# Patient Record
Sex: Female | Born: 1935
Health system: Southern US, Community
[De-identification: ages and names within clinical notes are randomized; demographics above are authoritative.]

## PROBLEM LIST (undated history)

## (undated) DIAGNOSIS — B029 Zoster without complications: Secondary | ICD-10-CM

## (undated) DIAGNOSIS — I729 Aneurysm of unspecified site: Secondary | ICD-10-CM

## (undated) DIAGNOSIS — M1990 Unspecified osteoarthritis, unspecified site: Secondary | ICD-10-CM

## (undated) DIAGNOSIS — O9989 Other specified diseases and conditions complicating pregnancy, childbirth and the puerperium: Secondary | ICD-10-CM

## (undated) DIAGNOSIS — I639 Cerebral infarction, unspecified: Secondary | ICD-10-CM

## (undated) DIAGNOSIS — R51 Headache: Secondary | ICD-10-CM

## (undated) DIAGNOSIS — I10 Essential (primary) hypertension: Secondary | ICD-10-CM

## (undated) DIAGNOSIS — E039 Hypothyroidism, unspecified: Secondary | ICD-10-CM

## (undated) DIAGNOSIS — G709 Myoneural disorder, unspecified: Secondary | ICD-10-CM

## (undated) DIAGNOSIS — R519 Headache, unspecified: Secondary | ICD-10-CM

## (undated) DIAGNOSIS — I499 Cardiac arrhythmia, unspecified: Secondary | ICD-10-CM

## (undated) DIAGNOSIS — I4891 Unspecified atrial fibrillation: Secondary | ICD-10-CM

## (undated) DIAGNOSIS — O99891 Other specified diseases and conditions complicating pregnancy: Secondary | ICD-10-CM

## (undated) HISTORY — PX: CARPAL TUNNEL RELEASE: SHX101

## (undated) HISTORY — DX: Zoster without complications: B02.9

## (undated) HISTORY — PX: ABDOMINAL HYSTERECTOMY: SHX81

## (undated) HISTORY — PX: CATARACT EXTRACTION: SUR2

## (undated) HISTORY — PX: APPENDECTOMY: SHX54

## (undated) HISTORY — PX: CHOLECYSTECTOMY: SHX55

## (undated) HISTORY — PX: JOINT REPLACEMENT: SHX530

## (undated) HISTORY — DX: Cerebral infarction, unspecified: I63.9

---

## 2004-10-16 ENCOUNTER — Ambulatory Visit: Payer: Self-pay | Admitting: Internal Medicine

## 2004-10-20 ENCOUNTER — Ambulatory Visit: Payer: Self-pay | Admitting: Internal Medicine

## 2004-10-23 ENCOUNTER — Inpatient Hospital Stay (HOSPITAL_BASED_OUTPATIENT_CLINIC_OR_DEPARTMENT_OTHER): Admission: RE | Admit: 2004-10-23 | Discharge: 2004-10-23 | Payer: Self-pay | Admitting: Internal Medicine

## 2004-10-23 ENCOUNTER — Ambulatory Visit: Payer: Self-pay | Admitting: Internal Medicine

## 2004-10-30 ENCOUNTER — Ambulatory Visit: Payer: Self-pay | Admitting: Internal Medicine

## 2004-11-03 ENCOUNTER — Ambulatory Visit: Payer: Self-pay

## 2015-09-10 DIAGNOSIS — I639 Cerebral infarction, unspecified: Secondary | ICD-10-CM | POA: Diagnosis not present

## 2015-09-10 DIAGNOSIS — E7800 Pure hypercholesterolemia, unspecified: Secondary | ICD-10-CM | POA: Diagnosis not present

## 2015-09-10 DIAGNOSIS — R262 Difficulty in walking, not elsewhere classified: Secondary | ICD-10-CM | POA: Diagnosis not present

## 2015-09-10 DIAGNOSIS — Z7982 Long term (current) use of aspirin: Secondary | ICD-10-CM | POA: Diagnosis not present

## 2015-09-10 DIAGNOSIS — R200 Anesthesia of skin: Secondary | ICD-10-CM | POA: Diagnosis not present

## 2015-09-10 DIAGNOSIS — Z91010 Allergy to peanuts: Secondary | ICD-10-CM | POA: Diagnosis not present

## 2015-09-10 DIAGNOSIS — Z136 Encounter for screening for cardiovascular disorders: Secondary | ICD-10-CM | POA: Diagnosis not present

## 2015-09-10 DIAGNOSIS — Z91018 Allergy to other foods: Secondary | ICD-10-CM | POA: Diagnosis not present

## 2015-09-10 DIAGNOSIS — I10 Essential (primary) hypertension: Secondary | ICD-10-CM | POA: Diagnosis not present

## 2015-09-10 DIAGNOSIS — R42 Dizziness and giddiness: Secondary | ICD-10-CM | POA: Diagnosis not present

## 2015-09-25 DIAGNOSIS — I10 Essential (primary) hypertension: Secondary | ICD-10-CM | POA: Diagnosis not present

## 2015-09-25 DIAGNOSIS — R9431 Abnormal electrocardiogram [ECG] [EKG]: Secondary | ICD-10-CM | POA: Diagnosis not present

## 2015-09-25 DIAGNOSIS — E7800 Pure hypercholesterolemia, unspecified: Secondary | ICD-10-CM | POA: Diagnosis not present

## 2015-09-25 DIAGNOSIS — E669 Obesity, unspecified: Secondary | ICD-10-CM | POA: Diagnosis not present

## 2015-11-07 DIAGNOSIS — R55 Syncope and collapse: Secondary | ICD-10-CM | POA: Diagnosis not present

## 2015-11-14 DIAGNOSIS — I480 Paroxysmal atrial fibrillation: Secondary | ICD-10-CM | POA: Diagnosis not present

## 2015-11-14 DIAGNOSIS — R55 Syncope and collapse: Secondary | ICD-10-CM | POA: Diagnosis not present

## 2015-11-14 DIAGNOSIS — I10 Essential (primary) hypertension: Secondary | ICD-10-CM | POA: Diagnosis not present

## 2015-11-15 DIAGNOSIS — I4891 Unspecified atrial fibrillation: Secondary | ICD-10-CM | POA: Diagnosis not present

## 2015-11-23 DIAGNOSIS — I639 Cerebral infarction, unspecified: Secondary | ICD-10-CM

## 2015-11-23 HISTORY — DX: Cerebral infarction, unspecified: I63.9

## 2015-12-16 ENCOUNTER — Emergency Department (HOSPITAL_COMMUNITY): Payer: Medicare Other

## 2015-12-16 ENCOUNTER — Inpatient Hospital Stay (HOSPITAL_COMMUNITY)
Admission: EM | Admit: 2015-12-16 | Discharge: 2015-12-18 | DRG: 066 | Disposition: A | Discharge status: Home / Self Care | Payer: Medicare Other | Attending: Internal Medicine | Admitting: Internal Medicine

## 2015-12-16 ENCOUNTER — Other Ambulatory Visit: Payer: Self-pay

## 2015-12-16 ENCOUNTER — Encounter (HOSPITAL_COMMUNITY): Payer: Self-pay | Admitting: Emergency Medicine

## 2015-12-16 DIAGNOSIS — R4781 Slurred speech: Secondary | ICD-10-CM | POA: Diagnosis present

## 2015-12-16 DIAGNOSIS — G43909 Migraine, unspecified, not intractable, without status migrainosus: Secondary | ICD-10-CM | POA: Diagnosis present

## 2015-12-16 DIAGNOSIS — Z6836 Body mass index (BMI) 36.0-36.9, adult: Secondary | ICD-10-CM

## 2015-12-16 DIAGNOSIS — R42 Dizziness and giddiness: Secondary | ICD-10-CM | POA: Diagnosis not present

## 2015-12-16 DIAGNOSIS — E669 Obesity, unspecified: Secondary | ICD-10-CM | POA: Diagnosis present

## 2015-12-16 DIAGNOSIS — E785 Hyperlipidemia, unspecified: Secondary | ICD-10-CM | POA: Diagnosis not present

## 2015-12-16 DIAGNOSIS — I6340 Cerebral infarction due to embolism of unspecified cerebral artery: Principal | ICD-10-CM | POA: Diagnosis present

## 2015-12-16 DIAGNOSIS — I639 Cerebral infarction, unspecified: Secondary | ICD-10-CM

## 2015-12-16 DIAGNOSIS — G459 Transient cerebral ischemic attack, unspecified: Secondary | ICD-10-CM

## 2015-12-16 DIAGNOSIS — E7800 Pure hypercholesterolemia, unspecified: Secondary | ICD-10-CM | POA: Diagnosis present

## 2015-12-16 DIAGNOSIS — I10 Essential (primary) hypertension: Secondary | ICD-10-CM | POA: Diagnosis not present

## 2015-12-16 DIAGNOSIS — R29700 NIHSS score 0: Secondary | ICD-10-CM | POA: Diagnosis present

## 2015-12-16 DIAGNOSIS — R479 Unspecified speech disturbances: Secondary | ICD-10-CM | POA: Diagnosis present

## 2015-12-16 DIAGNOSIS — I480 Paroxysmal atrial fibrillation: Secondary | ICD-10-CM | POA: Diagnosis present

## 2015-12-16 DIAGNOSIS — Z7982 Long term (current) use of aspirin: Secondary | ICD-10-CM

## 2015-12-16 DIAGNOSIS — I671 Cerebral aneurysm, nonruptured: Secondary | ICD-10-CM | POA: Diagnosis present

## 2015-12-16 DIAGNOSIS — I4891 Unspecified atrial fibrillation: Secondary | ICD-10-CM | POA: Diagnosis present

## 2015-12-16 DIAGNOSIS — Z79899 Other long term (current) drug therapy: Secondary | ICD-10-CM

## 2015-12-16 DIAGNOSIS — I499 Cardiac arrhythmia, unspecified: Secondary | ICD-10-CM | POA: Diagnosis not present

## 2015-12-16 DIAGNOSIS — R51 Headache: Secondary | ICD-10-CM | POA: Diagnosis not present

## 2015-12-16 DIAGNOSIS — R531 Weakness: Secondary | ICD-10-CM

## 2015-12-16 HISTORY — DX: Essential (primary) hypertension: I10

## 2015-12-16 HISTORY — DX: Unspecified atrial fibrillation: I48.91

## 2015-12-16 LAB — COMPREHENSIVE METABOLIC PANEL
ALK PHOS: 58 U/L (ref 38–126)
ALT: 18 U/L (ref 14–54)
ANION GAP: 12 (ref 5–15)
AST: 19 U/L (ref 15–41)
Albumin: 4.2 g/dL (ref 3.5–5.0)
BILIRUBIN TOTAL: 0.8 mg/dL (ref 0.3–1.2)
BUN: 8 mg/dL (ref 6–20)
CALCIUM: 9.4 mg/dL (ref 8.9–10.3)
CO2: 26 mmol/L (ref 22–32)
Chloride: 101 mmol/L (ref 101–111)
Creatinine, Ser: 0.8 mg/dL (ref 0.44–1.00)
Glucose, Bld: 91 mg/dL (ref 65–99)
POTASSIUM: 3.6 mmol/L (ref 3.5–5.1)
Sodium: 139 mmol/L (ref 135–145)
TOTAL PROTEIN: 7.5 g/dL (ref 6.5–8.1)

## 2015-12-16 LAB — DIFFERENTIAL
Basophils Absolute: 0 10*3/uL (ref 0.0–0.1)
Basophils Relative: 0 %
EOS ABS: 0.2 10*3/uL (ref 0.0–0.7)
EOS PCT: 3 %
LYMPHS ABS: 2.7 10*3/uL (ref 0.7–4.0)
LYMPHS PCT: 40 %
MONO ABS: 0.5 10*3/uL (ref 0.1–1.0)
MONOS PCT: 7 %
NEUTROS PCT: 50 %
Neutro Abs: 3.4 10*3/uL (ref 1.7–7.7)

## 2015-12-16 LAB — CBC
HEMATOCRIT: 40.3 % (ref 36.0–46.0)
Hemoglobin: 13.5 g/dL (ref 12.0–15.0)
MCH: 28.3 pg (ref 26.0–34.0)
MCHC: 33.5 g/dL (ref 30.0–36.0)
MCV: 84.5 fL (ref 78.0–100.0)
Platelets: 235 10*3/uL (ref 150–400)
RBC: 4.77 MIL/uL (ref 3.87–5.11)
RDW: 13.5 % (ref 11.5–15.5)
WBC: 6.8 10*3/uL (ref 4.0–10.5)

## 2015-12-16 LAB — I-STAT CHEM 8, ED
BUN: 9 mg/dL (ref 6–20)
CALCIUM ION: 1.15 mmol/L (ref 1.13–1.30)
Chloride: 100 mmol/L — ABNORMAL LOW (ref 101–111)
Creatinine, Ser: 0.7 mg/dL (ref 0.44–1.00)
Glucose, Bld: 87 mg/dL (ref 65–99)
HEMATOCRIT: 45 % (ref 36.0–46.0)
Hemoglobin: 15.3 g/dL — ABNORMAL HIGH (ref 12.0–15.0)
Potassium: 3.5 mmol/L (ref 3.5–5.1)
Sodium: 139 mmol/L (ref 135–145)
TCO2: 24 mmol/L (ref 0–100)

## 2015-12-16 LAB — I-STAT TROPONIN, ED: TROPONIN I, POC: 0 ng/mL (ref 0.00–0.08)

## 2015-12-16 NOTE — ED Notes (Signed)
Pt with ongoing headache and dizziness x 2 days following yard work. Pt was seen at [facility] today and sent here. Pt daughter states pt had some confusion today.

## 2015-12-16 NOTE — ED Notes (Signed)
Neurologist at bedside. 

## 2015-12-16 NOTE — ED Provider Notes (Signed)
CSN: [id number]     Arrival date & time 12/16/15  1717 History   First MD Initiated Contact with Patient 12/16/15 2218     Chief Complaint  Patient presents with  . Headache  . Dizziness     (Consider location/radiation/quality/duration/timing/severity/associated sxs/prior Treatment) HPI This is an 82 year old patient with history of paroxysmal atrial fib, not on anticoagulation due to history of GI bleed while on [place], who presents with 3 days of symptoms. She says that she was working in the yard on Saturday, did not drink much water or eat much, and started feeling ill in the evening. She had headache, vertigo, and generalized malaise. The headache persisted and became very severe over the course of the day Sunday, and was associated with vertigo that she said felt worse with head movement. She did not eat much all day.  This morning she woke up still with a headache and vertigo, and family reports that she had some difficulty finding words. She couldn't remember the name for bread.  She said her symptoms got better after she ate something, and she took meclizine and her vertigo abated. She presents after being referred here from urgent care.  She currently has no symptoms.    Past Medical History  Diagnosis Date  . Atrial fibrillation ([place])   . Hypertension    No past surgical history on file. No family history on file. Social History  Substance Use Topics  . Smoking status: Never Smoker   . Smokeless tobacco: None  . Alcohol Use: No   OB History    No data available     Review of Systems  Constitutional: Positive for diaphoresis. Negative for chills and fatigue.  Eyes: Negative for photophobia and visual disturbance.  Gastrointestinal: Negative for abdominal pain.  Musculoskeletal: Negative for myalgias, back pain, joint swelling, arthralgias and gait problem.  Neurological: Positive for dizziness, light-headedness and headaches. Negative for facial asymmetry.  All  other systems reviewed and are negative.     Allergies  Shellfish allergy  Home Medications   Prior to Admission medications   Medication Sig Start Date End Date Taking? Authorizing Provider  aspirin 81 MG tablet Take 81 mg by mouth daily.   Yes Historical Provider, MD  hydrochlorothiazide (HYDRODIURIL) 25 MG tablet Take 25 mg by mouth daily.   Yes Historical Provider, MD  meclizine (ANTIVERT) 25 MG tablet Take 12.5 mg by mouth 3 (three) times daily as needed for dizziness.   Yes Historical Provider, MD  rosuvastatin (CRESTOR) 5 MG tablet Take 2.5 mg by mouth daily.   Yes Historical Provider, MD   BP 140/55 mmHg  Pulse 40  Temp(Src) 97.8 F (36.6 C) (Oral)  Resp 17  Ht 5\' 2"  (1.575 m)  Wt 88.905 kg  BMI 35.84 kg/m2  SpO2 96% Physical Exam  Constitutional: She is oriented to person, place, and time. She appears well-developed and well-nourished. No distress.  HENT:  Head: Normocephalic and atraumatic.  Eyes: Pupils are equal, round, and reactive to light.  Neck: Normal range of motion. Neck supple.  Cardiovascular: Normal rate, regular rhythm and intact distal pulses.   Murmur heard. Pulmonary/Chest: Effort normal and breath sounds normal. No respiratory distress. She has no wheezes.  Abdominal: Soft. Bowel sounds are normal. She exhibits no distension.  Musculoskeletal: Normal range of motion. She exhibits no edema.  Neurological: She is alert and oriented to person, place, and time. No cranial nerve deficit.  Strength symmetric and 4 out of 5 in all extremities  Normal sensation  Normal lower extremity reflexes  No dysmetria, normal gait  Skin: Skin is warm and dry.  Psychiatric: She has a normal mood and affect.  Nursing note and vitals reviewed.   ED Course  Procedures (including critical care time) Labs Review Labs Reviewed  I-STAT CHEM 8, ED - Abnormal; Notable for the following:    Chloride 100 (*)    Hemoglobin 15.3 (*)    All other components within  normal limits  CBC  DIFFERENTIAL  COMPREHENSIVE METABOLIC PANEL  I-STAT TROPOININ, ED  CBG MONITORING, ED    Imaging Review Ct Head Wo Contrast  12/16/2015  CLINICAL DATA:  82 year old with dizziness and confusion. Dehydration. EXAM: CT HEAD WITHOUT CONTRAST TECHNIQUE: Contiguous axial images were obtained from the base of the skull through the vertex without intravenous contrast. COMPARISON:  None. FINDINGS: Brain: There is no evidence of acute intracranial hemorrhage, mass lesion, brain edema or extra-axial fluid collection. The ventricles and subarachnoid spaces are appropriately sized for age. There is no CT evidence of acute cortical infarction. There is mild periventricular white matter disease, likely due to chronic small vessel ischemic changes. Intracranial vascular calcifications are noted. Bones/sinuses/visualized face: The visualized paranasal sinuses, mastoid air cells and middle ears are clear. The calvarium is intact. IMPRESSION: No acute intracranial findings. Age-appropriate atrophy with mild periventricular white matter disease, likely due to chronic small vessel ischemic changes. Electronically Signed   By: Richardean Sale M.D.   On: 12/16/2015 20:22   I have personally reviewed and evaluated these images and lab results as part of my medical decision-making.   EKG Interpretation   Date/Time:  Monday December 16 2015 17:45:18 EDT Ventricular Rate:  56 PR Interval:  176 QRS Duration: 76 QT Interval:  434 QTC Calculation: 418 R Axis:   -27 Text Interpretation:  Sinus bradycardia with Premature atrial complexes  Nonspecific ST and T wave abnormality Abnormal ECG No previous ECGs  available Confirmed by YAO  MD, DAVID ([id number]) on 12/16/2015 10:58:08 PM      MDM   Final diagnoses:  Transient cerebral ischemia, unspecified transient cerebral ischemia type   Patient presents with dizziness, malaise, vertigo and word finding difficulties. Patient has normal head CT, and  symptoms began 3 days ago, doubt subarachnoid hemorrhage, as he would likely have some findings by this point. She does have a history of atrial fib, and is not anticoagulated which raises the suspicion for stroke, though she has no cerebellar findings on exam now, so unlikely an ongoing process. I am concerned for TIA, versus dehydration and hypoglycemia which may have been causing her symptoms, since they resolved after food and oral fluids. Will obtain consultation with neurology for recommendations regarding TIA.   Neurologist recommends admission, will admit to hospitalist for TIA workup.  Leata Mouse, MD 12/17/15 [id number]  Wandra Arthurs, MD 12/19/15 [phone]

## 2015-12-16 NOTE — Consult Note (Signed)
Admission H&P    Chief Complaint: Transient difficulty with speech output.  HPI: Sara Frye is an 82 y.o. female with a history of atrial fibrillation, hypertension and hyperlipidemia presenting with a complaint of difficulty with speech output earlier today. Symptom onset was at 9 AM. Symptoms lasted for several hours. She also complained of dizziness for the past 2 days with vertigo. She had no focal deficits until earlier today. CT scan of her head showed no acute intracranial abnormality. She's been taking aspirin 81 mg per day. She was recently given a trial of Xarelto. This medication was discontinued because of rectal bleeding. Speech abnormalities resolved after arriving in the emergency room. NIH stroke score at the time of this evaluation was 0. She had no associated focal weakness nor facial asymmetry during the time she had speech difficulty. Speech was slightly slurred transiently, however.  LSN: 9 AM on 12/16/2015 tPA Given: No: Deficits resolved mRankin:  Past Medical History  Diagnosis Date  . Atrial fibrillation ([place])   . Hypertension     No past surgical history on file.  No family history on file. Social History:  reports that she has never smoked. She does not have any smokeless tobacco history on file. She reports that she does not drink alcohol or use illicit drugs.  Allergies:  Allergies  Allergen Reactions  . Shellfish Allergy     Rash    Medications: Preadmission medications were reviewed by me.  ROS: History obtained from the patient  General ROS: negative for - chills, fatigue, fever, night sweats, weight gain or weight loss Psychological ROS: negative for - behavioral disorder, hallucinations, memory difficulties, mood swings or suicidal ideation Ophthalmic ROS: negative for - blurry vision, double vision, eye pain or loss of vision ENT ROS: negative for - epistaxis, nasal discharge, oral lesions, sore throat, tinnitus or vertigo Allergy and  Immunology ROS: negative for - hives or itchy/watery eyes Hematological and Lymphatic ROS: negative for - bleeding problems, bruising or swollen lymph nodes Endocrine ROS: negative for - galactorrhea, hair pattern changes, polydipsia/polyuria or temperature intolerance Respiratory ROS: negative for - cough, hemoptysis, shortness of breath or wheezing Cardiovascular ROS: negative for - chest pain, dyspnea on exertion, edema or irregular heartbeat Gastrointestinal ROS: negative for - abdominal pain, diarrhea, hematemesis, nausea/vomiting or stool incontinence Genito-Urinary ROS: negative for - dysuria, hematuria, incontinence or urinary frequency/urgency Musculoskeletal ROS: negative for - joint swelling or muscular weakness Neurological ROS: as noted in HPI Dermatological ROS: negative for rash and skin lesion changes  Physical Examination: Blood pressure 166/90, pulse 51, temperature 97.8 F (36.6 C), temperature source Oral, resp. rate 9, height 5' 2"  (1.575 m), weight 88.905 kg (196 lb), SpO2 100 %.  HEENT-  Normocephalic, no lesions, without obvious abnormality.  Normal external eye and conjunctiva.  Normal TM's bilaterally.  Normal auditory canals and external ears. Normal external nose, mucus membranes and septum.  Normal pharynx. Neck supple with no masses, nodes, nodules or enlargement. Cardiovascular - irregularly irregular rhythm, S1, S2 normal and no S3 or S4 Lungs - chest clear, no wheezing, rales, normal symmetric air entry Abdomen - soft, non-tender; bowel sounds normal; no masses,  no organomegaly Extremities - no joint deformities, effusion, or inflammation and no edema  Neurologic Examination: Mental Status: Alert, oriented, thought content appropriate.  Speech fluent without evidence of aphasia. Able to follow commands without difficulty. Cranial Nerves: II-Visual fields were normal. III/IV/VI-Pupils were equal and reacted normally to light. Extraocular movements were full  and conjugate.  V/VII-no facial numbness and no facial weakness. VIII-normal. X-normal speech and symmetrical palatal movement. XI: trapezius strength/neck flexion strength normal bilaterally XII-midline tongue extension with normal strength. Motor: 5/5 bilaterally with normal tone and bulk Sensory: Normal throughout. Deep Tendon Reflexes: Trace to 1+ and symmetric in upper extremities and absent in lower extremities. Plantars: Mute bilaterally Cerebellar: Normal finger-to-nose testing. Carotid auscultation: Normal  Results for orders placed or performed during the hospital encounter of 12/16/15 (from the past 48 hour(s))  I-stat troponin, ED (not at [facility], [facility])     Status: None   Collection Time: 12/16/15  6:22 PM  Result Value Ref Range   Troponin i, poc 0.00 0.00 - 0.08 ng/mL   Comment 3            Comment: Due to the release kinetics of cTnI, a negative result within the first hours of the onset of symptoms does not rule out myocardial infarction with certainty. If myocardial infarction is still suspected, repeat the test at appropriate intervals.   CBC     Status: None   Collection Time: 12/16/15  6:23 PM  Result Value Ref Range   WBC 6.8 4.0 - 10.5 K/uL   RBC 4.77 3.87 - 5.11 MIL/uL   Hemoglobin 13.5 12.0 - 15.0 g/dL   HCT 40.3 36.0 - 46.0 %   MCV 84.5 78.0 - 100.0 fL   MCH 28.3 26.0 - 34.0 pg   MCHC 33.5 30.0 - 36.0 g/dL   RDW 13.5 11.5 - 15.5 %   Platelets 235 150 - 400 K/uL  Differential     Status: None   Collection Time: 12/16/15  6:23 PM  Result Value Ref Range   Neutrophils Relative % 50 %   Neutro Abs 3.4 1.7 - 7.7 K/uL   Lymphocytes Relative 40 %   Lymphs Abs 2.7 0.7 - 4.0 K/uL   Monocytes Relative 7 %   Monocytes Absolute 0.5 0.1 - 1.0 K/uL   Eosinophils Relative 3 %   Eosinophils Absolute 0.2 0.0 - 0.7 K/uL   Basophils Relative 0 %   Basophils Absolute 0.0 0.0 - 0.1 K/uL  Comprehensive metabolic panel     Status: None   Collection Time: 12/16/15   6:23 PM  Result Value Ref Range   Sodium 139 135 - 145 mmol/L   Potassium 3.6 3.5 - 5.1 mmol/L   Chloride 101 101 - 111 mmol/L   CO2 26 22 - 32 mmol/L   Glucose, Bld 91 65 - 99 mg/dL   BUN 8 6 - 20 mg/dL   Creatinine, Ser 0.80 0.44 - 1.00 mg/dL   Calcium 9.4 8.9 - 10.3 mg/dL   Total Protein 7.5 6.5 - 8.1 g/dL   Albumin 4.2 3.5 - 5.0 g/dL   AST 19 15 - 41 U/L   ALT 18 14 - 54 U/L   Alkaline Phosphatase 58 38 - 126 U/L   Total Bilirubin 0.8 0.3 - 1.2 mg/dL   GFR calc non Af Amer >60 >60 mL/min   GFR calc Af Amer >60 >60 mL/min    Comment: (NOTE) The eGFR has been calculated using the CKD EPI equation. This calculation has not been validated in all clinical situations. eGFR's persistently <60 mL/min signify possible Chronic Kidney Disease.    Anion gap 12 5 - 15  I-Stat Chem 8, ED  (not at [facility], [facility])     Status: Abnormal   Collection Time: 12/16/15  6:23 PM  Result Value Ref Range   Sodium  139 135 - 145 mmol/L   Potassium 3.5 3.5 - 5.1 mmol/L   Chloride 100 (L) 101 - 111 mmol/L   BUN 9 6 - 20 mg/dL   Creatinine, Ser 0.70 0.44 - 1.00 mg/dL   Glucose, Bld 87 65 - 99 mg/dL   Calcium, Ion 1.15 1.13 - 1.30 mmol/L   TCO2 24 0 - 100 mmol/L   Hemoglobin 15.3 (H) 12.0 - 15.0 g/dL   HCT 45.0 36.0 - 46.0 %   Ct Head Wo Contrast  12/16/2015  CLINICAL DATA:  82 year old with dizziness and confusion. Dehydration. EXAM: CT HEAD WITHOUT CONTRAST TECHNIQUE: Contiguous axial images were obtained from the base of the skull through the vertex without intravenous contrast. COMPARISON:  None. FINDINGS: Brain: There is no evidence of acute intracranial hemorrhage, mass lesion, brain edema or extra-axial fluid collection. The ventricles and subarachnoid spaces are appropriately sized for age. There is no CT evidence of acute cortical infarction. There is mild periventricular white matter disease, likely due to chronic small vessel ischemic changes. Intracranial vascular calcifications are noted.  Bones/sinuses/visualized face: The visualized paranasal sinuses, mastoid air cells and middle ears are clear. The calvarium is intact. IMPRESSION: No acute intracranial findings. Age-appropriate atrophy with mild periventricular white matter disease, likely due to chronic small vessel ischemic changes. Electronically Signed   By: Richardean Sale M.D.   On: 12/16/2015 20:22    Assessment: 82 y.o. female with multiple risk factors for stroke presenting with probable transient ischemic attack. However, a small left MCA territory subcortical infarction cannot be ruled out at this point.  Stroke Risk Factors - atrial fibrillation, hyperlipidemia and hypertension  Plan: 1. HgbA1c, fasting lipid panel 2. MRI, MRA  of the brain without contrast 3. PT consult, OT consult, Speech consult 4. Echocardiogram 5. Carotid dopplers 6. Prophylactic therapy-Antiplatelet med: Aspirin  7. Risk factor modification 8. Telemetry monitoring  C.R. Nicole Kindred, MD [facility] [phone]  12/16/2015, 11:58 PM

## 2015-12-16 NOTE — ED Notes (Addendum)
Sara Frye doctor called nurse first, states he is sending for further evaluation of headaches and dizziness. The pt was prescribed xarelto by a doctor in [place] but she does not take it.

## 2015-12-16 NOTE — ED Notes (Signed)
EDP at bedside walking pt

## 2015-12-17 ENCOUNTER — Inpatient Hospital Stay (HOSPITAL_COMMUNITY): Payer: Medicare Other

## 2015-12-17 ENCOUNTER — Encounter (HOSPITAL_COMMUNITY): Payer: Self-pay | Admitting: Family Medicine

## 2015-12-17 DIAGNOSIS — I6340 Cerebral infarction due to embolism of unspecified cerebral artery: Secondary | ICD-10-CM | POA: Diagnosis not present

## 2015-12-17 DIAGNOSIS — Z7982 Long term (current) use of aspirin: Secondary | ICD-10-CM | POA: Diagnosis not present

## 2015-12-17 DIAGNOSIS — I10 Essential (primary) hypertension: Secondary | ICD-10-CM | POA: Diagnosis not present

## 2015-12-17 DIAGNOSIS — R29700 NIHSS score 0: Secondary | ICD-10-CM | POA: Diagnosis present

## 2015-12-17 DIAGNOSIS — Z79899 Other long term (current) drug therapy: Secondary | ICD-10-CM | POA: Diagnosis not present

## 2015-12-17 DIAGNOSIS — R531 Weakness: Secondary | ICD-10-CM | POA: Diagnosis not present

## 2015-12-17 DIAGNOSIS — R4781 Slurred speech: Secondary | ICD-10-CM | POA: Diagnosis present

## 2015-12-17 DIAGNOSIS — R42 Dizziness and giddiness: Secondary | ICD-10-CM

## 2015-12-17 DIAGNOSIS — I639 Cerebral infarction, unspecified: Secondary | ICD-10-CM | POA: Insufficient documentation

## 2015-12-17 DIAGNOSIS — R479 Unspecified speech disturbances: Secondary | ICD-10-CM | POA: Diagnosis present

## 2015-12-17 DIAGNOSIS — E669 Obesity, unspecified: Secondary | ICD-10-CM | POA: Diagnosis present

## 2015-12-17 DIAGNOSIS — G43909 Migraine, unspecified, not intractable, without status migrainosus: Secondary | ICD-10-CM | POA: Diagnosis present

## 2015-12-17 DIAGNOSIS — Z6836 Body mass index (BMI) 36.0-36.9, adult: Secondary | ICD-10-CM | POA: Diagnosis not present

## 2015-12-17 DIAGNOSIS — I6789 Other cerebrovascular disease: Secondary | ICD-10-CM

## 2015-12-17 DIAGNOSIS — E785 Hyperlipidemia, unspecified: Secondary | ICD-10-CM | POA: Diagnosis not present

## 2015-12-17 DIAGNOSIS — I480 Paroxysmal atrial fibrillation: Secondary | ICD-10-CM | POA: Diagnosis not present

## 2015-12-17 DIAGNOSIS — I671 Cerebral aneurysm, nonruptured: Secondary | ICD-10-CM | POA: Diagnosis present

## 2015-12-17 DIAGNOSIS — E7800 Pure hypercholesterolemia, unspecified: Secondary | ICD-10-CM | POA: Diagnosis present

## 2015-12-17 DIAGNOSIS — I4891 Unspecified atrial fibrillation: Secondary | ICD-10-CM | POA: Diagnosis present

## 2015-12-17 LAB — RAPID URINE DRUG SCREEN, HOSP PERFORMED
Amphetamines: NOT DETECTED
Barbiturates: NOT DETECTED
Benzodiazepines: NOT DETECTED
COCAINE: NOT DETECTED
OPIATES: NOT DETECTED
Tetrahydrocannabinol: NOT DETECTED

## 2015-12-17 LAB — LIPID PANEL
Cholesterol: 136 mg/dL (ref 0–200)
HDL: 46 mg/dL (ref >40)
LDL Cholesterol: 66 mg/dL (ref 0–99)
Total CHOL/HDL Ratio: 3 RATIO
Triglycerides: 119 mg/dL (ref <150)
VLDL: 24 mg/dL (ref 0–40)

## 2015-12-17 LAB — URINALYSIS, ROUTINE W REFLEX MICROSCOPIC
Bilirubin Urine: NEGATIVE
GLUCOSE, UA: NEGATIVE mg/dL
Hgb urine dipstick: NEGATIVE
Ketones, ur: NEGATIVE mg/dL
Nitrite: NEGATIVE
PH: 6 (ref 5.0–8.0)
Protein, ur: NEGATIVE mg/dL
Specific Gravity, Urine: 1.008 (ref 1.005–1.030)

## 2015-12-17 LAB — ECHOCARDIOGRAM COMPLETE
Height: 60 in
WEIGHTICAEL: 3020.8 [oz_av]

## 2015-12-17 LAB — URINE MICROSCOPIC-ADD ON: Squamous Epithelial / LPF: NONE SEEN

## 2015-12-17 MED ORDER — HYDROCHLOROTHIAZIDE 25 MG PO TABS: 25.0000 mg | ORAL_TABLET | Freq: Every day | ORAL | Status: DC

## 2015-12-17 MED ORDER — SENNOSIDES-DOCUSATE SODIUM 8.6-50 MG PO TABS
1.0000 | ORAL_TABLET | Freq: Every evening | ORAL | Status: DC | PRN
  Filled 2015-12-17: qty 1

## 2015-12-17 MED ORDER — ACETAMINOPHEN 325 MG PO TABS
650.0000 mg | ORAL_TABLET | Freq: Four times a day (QID) | ORAL | Status: DC | PRN
  Administered 2015-12-17: 650 mg via ORAL
  Filled 2015-12-17: qty 2

## 2015-12-17 MED ORDER — ENOXAPARIN SODIUM 40 MG/0.4ML ~~LOC~~ SOLN
40.0000 mg | SUBCUTANEOUS | Status: DC
  Administered 2015-12-17: 40 mg via SUBCUTANEOUS
  Filled 2015-12-17: qty 0.4

## 2015-12-17 MED ORDER — ASPIRIN 325 MG PO TABS
325.0000 mg | ORAL_TABLET | Freq: Every day | ORAL | Status: DC
  Administered 2015-12-17 – 2015-12-18 (×2): 325 mg via ORAL
  Filled 2015-12-17 (×2): qty 1

## 2015-12-17 MED ORDER — LORAZEPAM 2 MG/ML IJ SOLN
1.0000 mg | Freq: Once | INTRAMUSCULAR | Status: AC
  Administered 2015-12-17: 1 mg via INTRAVENOUS
  Filled 2015-12-17: qty 1

## 2015-12-17 MED ORDER — ASPIRIN 81 MG PO TABS: 81.0000 mg | ORAL_TABLET | Freq: Every day | ORAL | Status: DC

## 2015-12-17 MED ORDER — STROKE: EARLY STAGES OF RECOVERY BOOK
Freq: Once | Status: AC
  Administered 2015-12-17: 04:00:00
  Filled 2015-12-17: qty 1

## 2015-12-17 MED ORDER — ASPIRIN 300 MG RE SUPP: 300.0000 mg | Freq: Every day | RECTAL | Status: DC

## 2015-12-17 MED ORDER — ROSUVASTATIN CALCIUM 5 MG PO TABS
2.5000 mg | ORAL_TABLET | Freq: Every day | ORAL | Status: DC
Start: 2015-12-17 — End: 2015-12-18
  Administered 2015-12-17 – 2015-12-18 (×2): 2.5 mg via ORAL
  Filled 2015-12-17 (×2): qty 0.5

## 2015-12-17 MED ORDER — APIXABAN 5 MG PO TABS
5.0000 mg | ORAL_TABLET | Freq: Two times a day (BID) | ORAL | Status: DC
  Administered 2015-12-17 – 2015-12-18 (×2): 5 mg via ORAL
  Filled 2015-12-17 (×2): qty 1

## 2015-12-17 NOTE — Progress Notes (Signed)
ATTEMPTED PT AT 2am WITH MEDS AND PT WOULD NOT HOLD STILL, PT CLIMBED OUT OF SCANNER, CALLED ED RN FOR MORE MEDS AND SHE STATED THAT PT HAD ALL THE MEDS THAT SHE COULD HAVE, PT SENT BACK TO ER

## 2015-12-17 NOTE — Progress Notes (Signed)
  Echocardiogram 2D Echocardiogram has been performed.  Sara Frye 12/17/2015, 11:11 AM

## 2015-12-17 NOTE — Progress Notes (Signed)
Patient seen and examined  82 y.o. female with a history of atrial fibrillation, hypertension and hyperlipidemia presenting with a complaint of difficulty with speech output earlier today. Symptom onset was at 9 AM. Symptoms lasted for several hours. She also complained of dizziness for the past 2 days with vertigo. She had no focal deficits until earlier today. CT scan of her head showed no acute intracranial abnormality. She's been taking aspirin 81 mg per day. She was recently given a trial of Xarelto. This medication was discontinued because of rectal bleeding  Assessment and plan 1. Speech disturbance, ?TIA/CVA  - Presenting symptoms have appeared to resolve while still in ED  - Pt is at high-risk for CVA with HTN, HLD, and a fib without [facility]  - Neurology is consulting and much appreciated  - Head CT neg for acute abnormality  Discontinue telemetry  MRI/MRA brain pending,  Carotid Doppler No significant (1-39%) ICA stenosis 2-D echo-EF of 123456 LV systolic function; moderate LVH; grade 1 diastolic  dysfunction with elevated LV filling pressure; severe LAE; trace  MR. LDL 66, triglycerides 119 - Full-dose ASA for secondary ppx  - PT, OT, SLP evals   2. Atrial fibrillation, paroxysmal - CHADS-VASc is 30 (age x2, gender, HTN)  - Currently in a sinus bradycardia - Previously on Xarelto, reportedly discontinued following a GIB  - Monitor on telemetry , neurology recommended starting patient on Eliquis  3. Hypertension - At goal currently  - Managed with HCTZ at home  - Hold HCTZ for now pending CVA workup

## 2015-12-17 NOTE — Progress Notes (Signed)
ANTICOAGULATION CONSULT NOTE - Initial Consult  Pharmacy Consult:  Eliquis Indication: atrial fibrillation  Allergies  Allergen Reactions  . Shellfish Allergy     Rash    Patient Measurements: Height: 5' (152.4 cm) Weight: 188 lb 12.8 oz (85.639 kg) IBW/kg (Calculated) : 45.5   Vital Signs: Temp: 97.7 F (36.5 C) (04/25 1214) Temp Source: Oral (04/25 1214) BP: 138/68 mmHg (04/25 1214) Pulse Rate: 73 (04/25 1214)  Labs:  Recent Labs  12/16/15 1823  HGB 13.5  15.3*  HCT 40.3  45.0  PLT 235  CREATININE 0.80  0.70    Estimated Creatinine Clearance: 50.8 mL/min (by C-G formula based on Cr of 0.8).   Medical History: Past Medical History  Diagnosis Date  . Atrial fibrillation ([place])   . Hypertension        Assessment: 77 YOF presented with dizziness and speech difficulty.  Head CT negative for acute stroke.  Pharmacy consulted to initiate Eliquis for Afib.  Noted patient was previously on Xarelto, which was discontinued due to GIB.  Baseline labs reviewed.   Goal of Therapy:  Appropriate anticoagulation   Plan:  - D/C Lovenox - Eliquis 5mg  PO BID - Consider reducing ASA to 81mg  daily - Pharmacy will sign off and follow peripherally.  Thank you for the consult!    Jaynie Hitch D. Mina Marble, PharmD, BCPS Pager:  [phone] 12/17/2015, 1:20 PM

## 2015-12-17 NOTE — Progress Notes (Addendum)
STROKE TEAM PROGRESS NOTE   HISTORY OF PRESENT ILLNESS Yosselin P Goldfield is an 82 y.o. female with a history of atrial fibrillation, hypertension and hyperlipidemia presenting with a complaint of difficulty with speech output earlier today. Symptom onset was at 9 AM 12/16/2015. Symptoms lasted for several hours. She also complained of dizziness for the past 2 days with vertigo. She had no focal deficits until earlier today. CT scan of her head showed no acute intracranial abnormality. She's been taking aspirin 81 mg per day. She was recently given a trial of Xarelto. This medication was discontinued because of rectal bleeding. Speech abnormalities resolved after arriving in the emergency room. NIH stroke score at the time of this evaluation was 0. She had no associated focal weakness nor facial asymmetry during the time she had speech difficulty. Speech was slightly slurred transiently, however. Patient was not administered IV t-PA secondary to deficits resolved. She was admitted for further evaluation and treatment.   SUBJECTIVE (INTERVAL HISTORY) No family is at the bedside.  She just finished washing up in the room. She stated that she lives in [place] and had dizziness 4-5 months ago and found to have afib. She was put on Xarelto for less than one week and started to have significant rectal bleeding. She did have rectal bleeding long time ago. Her Xarelto was discontinued and she was put on ASA 81. Saturday night she was using computer and started with vertigo, worse over time and lasted about 46min. After that she had HA lasting all day Sunday. Monday, she had difficulty get words out and her husband sent her to ER. Overall she feels her condition is resolved now. She is in agreement to try new NOAC    OBJECTIVE Temp:  [97.5 F (36.4 C)-98.2 F (36.8 C)] 97.6 F (36.4 C) (04/25 2118) Pulse Rate:  [40-97] 60 (04/25 2118) Cardiac Rhythm:  [-] Normal sinus rhythm (04/25 1945) Resp:  [9-20] 16 (04/25  2118) BP: (129-166)/(48-106) 137/106 mmHg (04/25 2118) SpO2:  [95 %-100 %] 95 % (04/25 2118) Weight:  [188 lb 12.8 oz (85.639 kg)] 188 lb 12.8 oz (85.639 kg) (04/25 0227)  CBC:   Recent Labs Lab 12/16/15 1823  WBC 6.8  NEUTROABS 3.4  HGB 13.5  15.3*  HCT 40.3  45.0  MCV 84.5  PLT [id number]    Basic Metabolic Panel:   Recent Labs Lab 12/16/15 1823  NA 139  139  K 3.6  3.5  CL 101  100*  CO2 26  GLUCOSE 91  87  BUN 8  9  CREATININE 0.80  0.70  CALCIUM 9.4    Lipid Panel:     Component Value Date/Time   CHOL 136 12/17/2015 0436   TRIG 119 12/17/2015 0436   HDL 46 12/17/2015 0436   CHOLHDL 3.0 12/17/2015 0436   VLDL 24 12/17/2015 0436   LDLCALC 66 12/17/2015 0436   HgbA1c: No results found for: HGBA1C Urine Drug Screen:     Component Value Date/Time   LABOPIA NONE DETECTED 12/17/2015 1735   COCAINSCRNUR NONE DETECTED 12/17/2015 1735   LABBENZ NONE DETECTED 12/17/2015 1735   AMPHETMU NONE DETECTED 12/17/2015 1735   THCU NONE DETECTED 12/17/2015 1735   LABBARB NONE DETECTED 12/17/2015 1735      IMAGING I have personally reviewed the radiological images below and agree with the radiology interpretations.  Ct Head Wo Contrast 12/16/2015  IMPRESSION: No acute intracranial findings. Age-appropriate atrophy with mild periventricular white matter disease, likely due to chronic small  vessel ischemic changes.   Carotid Doppler   Bilateral: No significant (1-39%) ICA stenosis. Left distal ICA demonstrates elevated velocities, but no obvious plaque morphology noted. Could be elevated due tortuousity of vessel. Antegrade vertebral flow.    2D Echocardiogram  - Left ventricle: The cavity size was normal. Wall thickness was increased in a pattern of moderate LVH. Systolic function was normal. The estimated ejection fraction was in the range of 60% to 65%. Wall motion was normal; there were no regional wall motion abnormalities. Doppler parameters are consistent with  abnormal left ventricular relaxation (grade 1 diastolic dysfunction). Doppler parameters are cnsistent with high ventricular filling pressure. - Mitral valve: Calcified annulus. - Left atrium: The atrium was severely dilated. Impressions:   Normal LV systolic function; moderate LVH; grade 1 diastolic dysfunction with elevated LV filling pressure; severe LAE; trace MR.  Dg Chest 2 View  12/17/2015  IMPRESSION: 1. Low lung volumes with mild bibasilar atelectasis and/or infiltrates. 2.  Mild cardiomegaly with normal pulmonary vascularity.    Mr Brain Wo Contrast  12/17/2015  IMPRESSION: 1. Positive for numerous mostly small acute infarcts in the bilateral MCA territories compatible with sequelae of emboli in this patient with atrial fibrillation. Most confluent involvement in the left superior parietal lobe. No associated hemorrhage or mass effect. 2. 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 Recommend Neuro-endovascular follow-up. 3. Otherwise mild for age nonspecific white matter and deep gray matter signal changes most commonly due to chronic small vessel disease.    PHYSICAL EXAM  Temp:  [97.5 F (36.4 C)-98.2 F (36.8 C)] 97.6 F (36.4 C) (04/25 2118) Pulse Rate:  [40-97] 60 (04/25 2118) Resp:  [9-20] 16 (04/25 2118) BP: (129-166)/(48-106) 137/106 mmHg (04/25 2118) SpO2:  [95 %-100 %] 95 % (04/25 2118) Weight:  [188 lb 12.8 oz (85.639 kg)] 188 lb 12.8 oz (85.639 kg) (04/25 0227)  General - Well nourished, well developed, in no apparent distress.  Ophthalmologic - Sharp disc margins OU.  Cardiovascular - Regular rate and rhythm with no murmur.  Mental Status -  Level of arousal and orientation to time, place, and person were intact. Language including expression, naming, repetition, comprehension was assessed and found intact. Fund of Knowledge was assessed and was  intact.  Cranial Nerves II - XII - II - Visual field intact OU. III, IV, VI - Extraocular movements intact. V - Facial sensation intact bilaterally. VII - Facial movement intact bilaterally. VIII - Hearing & vestibular intact bilaterally. X - Palate elevates symmetrically. XI - Chin turning & shoulder shrug intact bilaterally. XII - Tongue protrusion intact.  Motor Strength - The patient's strength was normal in all extremities and pronator drift was absent.  Bulk was normal and fasciculations were absent.   Motor Tone - Muscle tone was assessed at the neck and appendages and was normal.  Reflexes - The patient's reflexes were 1+ in all extremities and she had no pathological reflexes.  Sensory - Light touch, temperature/pinprick were assessed and were symmetrical.    Coordination - The patient had normal movements in the hands and feet with no ataxia or dysmetria.  Tremor was absent.  Gait and Station - The patient's transfers, posture, gait, station, and turns were observed as normal.   ASSESSMENT/PLAN Ms. Chanah P Dvorak is a 82 y.o. female with history of atrial fibrillation, hypertension and hyperlipidemia presenting with difficulty with speech output. She did not  receive IV t-PA due to deficits resolved.   Stroke: bilateral anterior multifocal punctate infarcts, consistent with cardioembolic stroke due to afib not on [facility]  MRI  Bilateral MCA and ACA multifocal punctate infarcts   MRA  4-32mm ACOM aneurysm   Carotid Doppler  No significant stenosis   2D Echo  Mod LVH. EF 60-65%, no SOE  LDL 66, goal < 70   HgbA1c pending  Lovenox 40 mg sq daily for VTE prophylaxis Diet Heart Room service appropriate?: Yes; Fluid consistency:: Thin  aspirin 81 mg daily prior to admission, now on aspirin 325 mg daily. Recommend trial of eliquis 5mg  bid. Pt is in agreement.  Patient counseled to be compliant with her antithrombotic medications  Ongoing aggressive stroke risk factor  management  Therapy recommendations:  pending   Disposition:  pending   Atrial Fibrillation not on [facility]  Home anticoagulation:  No anticoagulation, on aspirin 81 mg daily .   Had been on xarelto in the past, stopped due to LGIB.  Recommend trial of eliquis 5mg  bid as eliquis has less risk of GIB than Xarelto  Pt is in agreement  ACOM aneurysm  Incidental finding  4-5 mm in diameter  Pt does have migraine like HA at baseline  Will refer to Dr. Estanislado Pandy as outpt   Hypertension  Stable  Permissive hypertension (OK if <220/120) for 24-48 hours post stroke and then gradually normalized within 5-7 days.  Hyperlipidemia  Home meds:  crestor 5 mg, resumed in hospital  LDL 66, at goal  Continue statin at discharge   Other Stroke Risk Factors  Advanced age  Obesity, Body mass index is 36.87 kg/(m^2).   Hospital day # 0  Rosalin Hawking, MD PhD Stroke Neurology 12/17/2015 9:25 PM  To contact Stroke Continuity provider, please refer to [email or web address] After hours, contact General Neurology

## 2015-12-17 NOTE — Evaluation (Signed)
Physical Therapy Evaluation Patient Details Name: Sara Frye MRN: [id number] DOB: 10/25/1928 Today's Date: 12/17/2015   History of Present Illness  Zyia P Galo is a 82 y.o. female with medical history significant for hypertension, hyperlipidemia, and paroxysmal atrial fibrillation previously on Xarelto prior to a GI bleed who presents to the ED with 2 days of dizziness and transient speech difficulty.  Clinical Impression  Patient presents with decreased mobility due to deficits listed in PT problem list.  She will benefit from skilled PT in the acute setting to allow return home with assist and likely no follow up PT needs.  May need cane and will trial next session.      Follow Up Recommendations No PT follow up    Equipment Recommendations  Other (comment) (TBA ? cane)    Recommendations for Other Services       Precautions / Restrictions Precautions Precautions: Fall      Mobility  Bed Mobility Overal bed mobility: Modified Independent                Transfers Overall transfer level: Modified independent Equipment used: None                Ambulation/Gait Ambulation/Gait assistance: Min guard Ambulation Distance (Feet): 220 Feet Assistive device: None Gait Pattern/deviations: Step-through pattern;Decreased stride length     General Gait Details: somewhat cautious reaching for rail in hallway and holding onto furniture in the room, one episode of lateral veering when talking with head turned; minguard most of distance, versus supervision  Stairs Stairs: Yes Stairs assistance: Supervision Stair Management: Two rails;Alternating pattern Number of Stairs: 6 General stair comments: slow but without physical help with railings  Wheelchair Mobility    Modified Rankin (Stroke Patients Only) Modified Rankin (Stroke Patients Only) Pre-Morbid Rankin Score: No symptoms Modified Rankin: Moderately severe disability     Balance Overall  balance assessment: Needs assistance         Standing balance support: No upper extremity supported Standing balance-Leahy Scale: Fair Standing balance comment: stands unsupported when quiet (about 30 sec) then reached for foot board when turning to continue talking                             Pertinent Vitals/Pain Pain Assessment: No/denies pain    Home Living Family/patient expects to be discharged to:: Private residence Living Arrangements: Spouse/significant other Available Help at Discharge: Available 24 hours/day;Friend(s) Type of Home: House Home Access: Stairs to enter Entrance Stairs-Rails: None Entrance Stairs-Number of Steps: 3 Home Layout: Two level;Able to live on main level with bedroom/bathroom Home Equipment: None      Prior Function Level of Independence: Independent         Comments: report very active; likes to garden and this episode may be predicated from working long in garden without drinking water per pt/s.o.     Hand Dominance        Extremity/Trunk Assessment               Lower Extremity Assessment: Generalized weakness         Communication   Communication: No difficulties  Cognition Arousal/Alertness: Awake/alert Behavior During Therapy: WFL for tasks assessed/performed Overall Cognitive Status: Within Functional Limits for tasks assessed                      General Comments General comments (skin integrity, edema, etc.): significant other in the room  and very supportive but seems to deny deficits    Exercises        Assessment/Plan    PT Assessment Patient needs continued PT services  PT Diagnosis Generalized weakness   PT Problem List Decreased strength;Decreased activity tolerance;Decreased balance;Decreased mobility;Decreased cognition;Decreased knowledge of precautions  PT Treatment Interventions DME instruction;Gait training;Stair training;Balance training;Functional mobility  training;Therapeutic activities;Therapeutic exercise;Patient/family education   PT Goals (Current goals can be found in the Care Plan section) Acute Rehab PT Goals Patient Stated Goal: To get stronger PT Goal Formulation: With patient Time For Goal Achievement: 12/24/15 Potential to Achieve Goals: Good    Frequency Min 4X/week   Barriers to discharge        Co-evaluation               End of Session Equipment Utilized During Treatment: Gait belt Activity Tolerance: Patient tolerated treatment well Patient left: in bed;with call bell/phone within reach;with family/visitor present           Time: [id number] PT Time Calculation (min) (ACUTE ONLY): 25 min   Charges:   PT Evaluation $PT Eval Moderate Complexity: 1 Procedure PT Treatments $Gait Training: 8-22 mins   PT G CodesReginia Naas 04-Jan-2016, 5:40 PM  Magda Kiel, [place] 2016-01-04

## 2015-12-17 NOTE — H&P (Signed)
History and Physical    Sara Frye [id number] DOB: [birth year] DOA: 12/16/2015  Referring Provider: EDP PCP: No primary care provider on file.  Outpatient Specialists: None listed  Patient coming from: Home   Chief Complaint: Dizziness, speech difficulty   HPI: Sara Frye is a 82 y.o. female with medical history significant for hypertension, hyperlipidemia, and paroxysmal atrial fibrillation previously on Xarelto prior to a GI bleed who presents to the ED with 2 days of dizziness and transient speech difficulty. Patient typically enjoys good health and is physically active but has been unable to carry out her usual activities for the past 2 days due to dizziness. She also notes a mild headache. Her daughter became concerned today when, in addition to the other symptoms, patient was noted to have word finding difficulty and some slurred speech. She went to urgent care for evaluation of these complaints and was directed to the emergency department instead. Patient denies any recent illness, fevers, chills, or sweats. She denies any chest pain, palpitations, or dyspnea. There has been no use of illicit drugs or alcohol and no recent trauma.   ED Course: Upon arrival to the ED, patient is found to be afebrile, saturating well on room air, and with vital signs stable. EKG is notable for a sinus bradycardia with rate 56 and a nonspecific repolarization abnormality. Head CT is negative for acute intracranial abnormality and blood work, including CMP and CBC are unremarkable. Neurology was consulted from the ED and the neurologist has evaluated the patient. Admission in the hospital is been recommended for evaluation of possible TIA/CVA.  Review of Systems:  All other systems reviewed and apart from HPI, are negative.  Past Medical History  Diagnosis Date  . Atrial fibrillation ([place])   . Hypertension     History reviewed. No pertinent past surgical history.   reports that she  has never smoked. She does not have any smokeless tobacco history on file. She reports that she does not drink alcohol or use illicit drugs.  Allergies  Allergen Reactions  . Shellfish Allergy     Rash    History reviewed. No pertinent family history.   Prior to Admission medications   Medication Sig Start Date End Date Taking? Authorizing Provider  aspirin 81 MG tablet Take 81 mg by mouth daily.   Yes Historical Provider, MD  hydrochlorothiazide (HYDRODIURIL) 25 MG tablet Take 25 mg by mouth daily.   Yes Historical Provider, MD  meclizine (ANTIVERT) 25 MG tablet Take 12.5 mg by mouth 3 (three) times daily as needed for dizziness.   Yes Historical Provider, MD  rosuvastatin (CRESTOR) 5 MG tablet Take 2.5 mg by mouth daily.   Yes Historical Provider, MD    Physical Exam: Filed Vitals:   12/16/15 2230 12/16/15 2312 12/16/15 2330 12/17/15 0000  BP: 150/56 165/68 166/90 140/55  Pulse: 53 66 51 40  Temp:      TempSrc:      Resp: 16 18 9 17   Height:      Weight:      SpO2: 98% 100% 100% 96%      Constitutional: NAD, calm, comfortable Eyes: PERTLA, lids and conjunctivae normal ENMT: Mucous membranes are moist. Posterior pharynx clear of any exudate or lesions.   Neck: normal, supple, no masses, no thyromegaly Respiratory: clear to auscultation bilaterally, no wheezing, no crackles. Normal respiratory effort. No accessory muscle use.  Cardiovascular: S1 & S2 heard, regular rate and rhythm, no murmurs / rubs / gallops. No  extremity edema. 2+ pedal pulses.   Abdomen: No distension, no tenderness, no masses palpated. Bowel sounds normal.  Musculoskeletal: no clubbing / cyanosis. No joint deformity upper and lower extremities. Good ROM. Normal muscle tone.  Skin: no rashes, lesions, ulcers. No induration Neurologic: CN 2-12 grossly intact. Sensation intact, DTR normal. Strength 5/5 in all 4 limbs.  Psychiatric: Normal judgment and insight. Alert and oriented x 3. Normal mood.      Labs on Admission: I have personally reviewed following labs and imaging studies  CBC:  Recent Labs Lab 12/16/15 1823  WBC 6.8  NEUTROABS 3.4  HGB 13.5  15.3*  HCT 40.3  45.0  MCV 84.5  PLT [id number]   Basic Metabolic Panel:  Recent Labs Lab 12/16/15 1823  NA 139  139  K 3.6  3.5  CL 101  100*  CO2 26  GLUCOSE 91  87  BUN 8  9  CREATININE 0.80  0.70  CALCIUM 9.4   GFR: Estimated Creatinine Clearance: 54.2 mL/min (by C-G formula based on Cr of 0.8). Liver Function Tests:  Recent Labs Lab 12/16/15 1823  AST 19  ALT 18  ALKPHOS 58  BILITOT 0.8  PROT 7.5  ALBUMIN 4.2   No results for input(s): LIPASE, AMYLASE in the last 168 hours. No results for input(s): AMMONIA in the last 168 hours. Coagulation Profile: No results for input(s): INR, PROTIME in the last 168 hours. Cardiac Enzymes: No results for input(s): CKTOTAL, CKMB, CKMBINDEX, TROPONINI in the last 168 hours. BNP (last 3 results) No results for input(s): PROBNP in the last 8760 hours. HbA1C: No results for input(s): HGBA1C in the last 72 hours. CBG: No results for input(s): GLUCAP in the last 168 hours. Lipid Profile: No results for input(s): CHOL, HDL, LDLCALC, TRIG, CHOLHDL, LDLDIRECT in the last 72 hours. Thyroid Function Tests: No results for input(s): TSH, T4TOTAL, FREET4, T3FREE, THYROIDAB in the last 72 hours. Anemia Panel: No results for input(s): VITAMINB12, FOLATE, FERRITIN, TIBC, IRON, RETICCTPCT in the last 72 hours. Urine analysis: No results found for: COLORURINE, APPEARANCEUR, LABSPEC, PHURINE, GLUCOSEU, HGBUR, BILIRUBINUR, KETONESUR, PROTEINUR, UROBILINOGEN, NITRITE, LEUKOCYTESUR Sepsis Labs: @LABRCNTIP (procalcitonin:4,lacticidven:4) )No results found for this or any previous visit (from the past 240 hour(s)).   Radiological Exams on Admission: Ct Head Wo Contrast  12/16/2015  CLINICAL DATA:  82 year old with dizziness and confusion. Dehydration. EXAM: CT HEAD WITHOUT  CONTRAST TECHNIQUE: Contiguous axial images were obtained from the base of the skull through the vertex without intravenous contrast. COMPARISON:  None. FINDINGS: Brain: There is no evidence of acute intracranial hemorrhage, mass lesion, brain edema or extra-axial fluid collection. The ventricles and subarachnoid spaces are appropriately sized for age. There is no CT evidence of acute cortical infarction. There is mild periventricular white matter disease, likely due to chronic small vessel ischemic changes. Intracranial vascular calcifications are noted. Bones/sinuses/visualized face: The visualized paranasal sinuses, mastoid air cells and middle ears are clear. The calvarium is intact. IMPRESSION: No acute intracranial findings. Age-appropriate atrophy with mild periventricular white matter disease, likely due to chronic small vessel ischemic changes. Electronically Signed   By: Richardean Sale M.D.   On: 12/16/2015 20:22    EKG: Independently reviewed. Sinus bradycardia (rate 56), PAC, non-specific repolarization abnormality   Assessment/Plan  1. Speech disturbance, ?TIA/CVA  - Presenting symptoms have appeared to resolve while still in ED  - Pt is at high-risk for CVA with HTN, HLD, and a fib without [facility]  - Neurology is consulting and much appreciated  -  Head CT neg for acute abnormality  - Monitor on telemetry  - Check MRI/MRA brain, carotid dopplers, TTE  - Check fasting lipids and A1c  - Full-dose ASA for secondary ppx  - PT, OT, SLP evals   2. Atrial fibrillation, paroxysmal - CHADS-VASc is 46 (age x2, gender, HTN)  - Currently in a sinus bradycardia - Previously on Xarelto, reportedly discontinued following a GIB  - Monitor on telemetry   3. Hypertension - At goal currently  - Managed with HCTZ at home  - Hold HCTZ for now pending CVA workup    DVT prophylaxis: sq Lovenox  Code Status: Full  Family Communication: None available  Disposition Plan: Admit  Consults called:  Neurology  Admission status: Inpatient   Vianne Bulls MD [facility] Pager [phone]  If 7PM-7AM, please contact night-coverage [email or web address] Password [facility]  12/17/2015, 12:47 AM

## 2015-12-17 NOTE — Progress Notes (Signed)
*  PRELIMINARY RESULTS* Vascular Ultrasound Carotid Duplex (Doppler) has been completed.  Preliminary findings: Bilateral: No significant (1-39%) ICA stenosis. Left distal ICA demonstrates elevated velocities, but no obvious plaque morphology noted. Could be elevated due tortuousity of vessel. Antegrade vertebral flow.    Landry Mellow, RDMS, RVT  12/17/2015, 11:13 AM

## 2015-12-18 DIAGNOSIS — I6340 Cerebral infarction due to embolism of unspecified cerebral artery: Principal | ICD-10-CM

## 2015-12-18 DIAGNOSIS — E785 Hyperlipidemia, unspecified: Secondary | ICD-10-CM

## 2015-12-18 DIAGNOSIS — R42 Dizziness and giddiness: Secondary | ICD-10-CM

## 2015-12-18 LAB — COMPREHENSIVE METABOLIC PANEL
ALT: 18 U/L (ref 14–54)
AST: 19 U/L (ref 15–41)
Albumin: 3.7 g/dL (ref 3.5–5.0)
Alkaline Phosphatase: 52 U/L (ref 38–126)
Anion gap: 11 (ref 5–15)
BILIRUBIN TOTAL: 1.1 mg/dL (ref 0.3–1.2)
BUN: 7 mg/dL (ref 6–20)
CHLORIDE: 103 mmol/L (ref 101–111)
CO2: 26 mmol/L (ref 22–32)
CREATININE: 0.83 mg/dL (ref 0.44–1.00)
Calcium: 9.1 mg/dL (ref 8.9–10.3)
GFR calc Af Amer: >60 mL/min (ref >60)
Glucose, Bld: 98 mg/dL (ref 65–99)
Potassium: 3.5 mmol/L (ref 3.5–5.1)
Sodium: 140 mmol/L (ref 135–145)
Total Protein: 6.4 g/dL — ABNORMAL LOW (ref 6.5–8.1)

## 2015-12-18 LAB — HEMOGLOBIN A1C
HEMOGLOBIN A1C: 6.1 % — AB (ref 4.8–5.6)
MEAN PLASMA GLUCOSE: 128 mg/dL

## 2015-12-18 LAB — CBC
HEMATOCRIT: 40.1 % (ref 36.0–46.0)
HEMOGLOBIN: 13.4 g/dL (ref 12.0–15.0)
MCH: 28.2 pg (ref 26.0–34.0)
MCHC: 33.4 g/dL (ref 30.0–36.0)
MCV: 84.4 fL (ref 78.0–100.0)
PLATELETS: 219 10*3/uL (ref 150–400)
RBC: 4.75 MIL/uL (ref 3.87–5.11)
RDW: 13.5 % (ref 11.5–15.5)
WBC: 5.3 10*3/uL (ref 4.0–10.5)

## 2015-12-18 MED ORDER — ROSUVASTATIN CALCIUM 5 MG PO TABS: 5.0000 mg | ORAL_TABLET | Freq: Every day | ORAL | Status: DC

## 2015-12-18 MED ORDER — APIXABAN 5 MG PO TABS: 5.0000 mg | ORAL_TABLET | Freq: Two times a day (BID) | ORAL | Status: DC

## 2015-12-18 NOTE — Evaluation (Signed)
Occupational Therapy Evaluation Patient Details Name: Sara Frye MRN: [id number] DOB: 1929-03-04 Today's Date: 12/18/2015    History of Present Illness Sara Frye is a 82 y.o. female with medical history significant for hypertension, hyperlipidemia, and paroxysmal atrial fibrillation previously on Xarelto prior to a GI bleed who presents to the ED with 2 days of dizziness and transient speech difficulty.   Clinical Impression   Pt reports she was independent with ADLs PTA. Currently pt is overall min assist-min guard for ADLs and functional mobility. Pt planning to d/c home with 24/7 supervision/assist from her significant other. Pt would benefit from continued skilled OT to address established goals.      Follow Up Recommendations  No OT follow up;Supervision/Assistance - 24 hour    Equipment Recommendations  None recommended by OT    Recommendations for Other Services       Precautions / Restrictions Precautions Precautions: Fall Restrictions Weight Bearing Restrictions: No      Mobility Bed Mobility               General bed mobility comments: Pt OOB in chair upon arrival.  Transfers Overall transfer level: Needs assistance Equipment used: None Transfers: Sit to/from Stand Sit to Stand: Min guard         General transfer comment: Min guard for safety.    Balance Overall balance assessment: Needs assistance Sitting-balance support: No upper extremity supported;Feet supported Sitting balance-Leahy Scale: Good     Standing balance support: No upper extremity supported;During functional activity Standing balance-Leahy Scale: Fair Standing balance comment: static                            ADL Overall ADL's : Needs assistance/impaired Eating/Feeding: Set up;Sitting   Grooming: Min guard;Standing;Wash/dry hands   Upper Body Bathing: Supervision/ safety;Sitting   Lower Body Bathing: Min guard;Sit to/from stand   Upper  Body Dressing : Supervision/safety;Sitting   Lower Body Dressing: Minimal assistance;Sit to/from stand   Toilet Transfer: Minimal assistance;Ambulation;Comfort height toilet;Grab bars (hand held assist for mobility )   Toileting- Water quality scientist and Hygiene: Min guard;Sit to/from stand       Functional mobility during ADLs: Minimal assistance (hand held assist) General ADL Comments: Following functional mobility outside of room pts HR 147, SpO2 96 on RA and pt asymptomatic; RN notified. Pt reports she feels overall more "shakey" today vs yesterday.     Vision     Perception     Praxis      Pertinent Vitals/Pain Pain Assessment: No/denies pain     Hand Dominance Right   Extremity/Trunk Assessment Upper Extremity Assessment Upper Extremity Assessment: Overall WFL for tasks assessed   Lower Extremity Assessment Lower Extremity Assessment: Defer to PT evaluation       Communication Communication Communication: No difficulties   Cognition Arousal/Alertness: Awake/alert Behavior During Therapy: WFL for tasks assessed/performed Overall Cognitive Status: Within Functional Limits for tasks assessed                     General Comments       Exercises       Shoulder Instructions      Home Living Family/patient expects to be discharged to:: Private residence Living Arrangements: Spouse/significant other Available Help at Discharge: Available 24 hours/day;Friend(s) Type of Home: House Home Access: Stairs to enter [employer] of Steps: 3 Entrance Stairs-Rails: None Home Layout: Two level;Able to live on main level with bedroom/bathroom  Alternate Level Stairs-Number of Steps: stays on main   Bathroom Shower/Tub: Occupational psychologist: Handicapped height     Home Equipment: Shower seat - built in          Prior Functioning/Environment Level of Independence: Independent        Comments: report very active; likes to  garden and this episode may be predicated from working long in garden without drinking water per pt/s.o.    OT Diagnosis: Generalized weakness   OT Problem List: Decreased activity tolerance;Impaired balance (sitting and/or standing);Decreased safety awareness;Decreased knowledge of use of DME or AE;Decreased knowledge of precautions   OT Treatment/Interventions: Self-care/ADL training;Energy conservation;DME and/or AE instruction;Therapeutic activities;Patient/family education;Balance training    OT Goals(Current goals can be found in the care plan section) Acute Rehab OT Goals Patient Stated Goal: To get stronger OT Goal Formulation: With patient Time For Goal Achievement: 01/01/16 Potential to Achieve Goals: Good ADL Goals Pt Will Perform Grooming: with modified independence;standing Pt Will Perform Upper Body Bathing: with modified independence;sitting Pt Will Perform Lower Body Bathing: with modified independence;sit to/from stand Pt Will Transfer to Toilet: with modified independence;ambulating;regular height toilet Pt Will Perform Toileting - Clothing Manipulation and hygiene: with modified independence;sit to/from stand Pt Will Perform Tub/Shower Transfer: Shower transfer;ambulating;shower seat;with supervision  OT Frequency: Min 2X/week   Barriers to D/C:            Co-evaluation              End of Session Equipment Utilized During Treatment: Gait belt Nurse Communication: Other (comment) (Vital signs following mobility)  Activity Tolerance: Patient tolerated treatment well Patient left: in chair;with call bell/phone within reach;with chair alarm set   Time: [phone] OT Time Calculation (min): 23 min Charges:  OT General Charges $OT Visit: 1 Procedure OT Evaluation $OT Eval Moderate Complexity: 1 Procedure OT Treatments $Self Care/Home Management : 8-22 mins G-Codes:     Binnie Kand M.S., OTR/L Pager: [phone]  12/18/2015, 12:02 PM

## 2015-12-18 NOTE — Progress Notes (Signed)
Physical Therapy Treatment Patient Details Name: Sara Frye MRN: [id number] DOB: 22-Jan-1929 Today's Date: 12/18/2015    History of Present Illness Sara Frye is a 82 y.o. female with medical history significant for hypertension, hyperlipidemia, and paroxysmal atrial fibrillation previously on Xarelto prior to a GI bleed who presents to the ED with 2 days of dizziness and transient speech difficulty.    PT Comments    Patient progressing with stability somewhat improved with use of cane, but need further training so recommend follow up HHPT and cane for home use.  PT to follow until d/c.  Follow Up Recommendations  Home health PT     Equipment Recommendations  Cane    Recommendations for Other Services       Precautions / Restrictions Precautions Precautions: Fall Restrictions Weight Bearing Restrictions: No    Mobility  Bed Mobility               General bed mobility comments: Pt OOB in chair upon arrival.  Transfers Overall transfer level: Needs assistance Equipment used: None Transfers: Sit to/from Stand Sit to Stand: Supervision         General transfer comment: up from recliner  Ambulation/Gait Ambulation/Gait assistance: Supervision;Min guard Ambulation Distance (Feet): 250 Feet Assistive device: Straight cane Gait Pattern/deviations: Step-through pattern;Decreased stride length     General Gait Details: initially with hand over hand for sequence with cane, then with minguard and cues for sequencing, able to achieve close S at end of session.   Stairs            Wheelchair Mobility    Modified Rankin (Stroke Patients Only) Modified Rankin (Stroke Patients Only) Pre-Morbid Rankin Score: No symptoms Modified Rankin: Moderately severe disability     Balance Overall balance assessment: Needs assistance Sitting-balance support: No upper extremity supported;Feet supported Sitting balance-Leahy Scale: Good     Standing  balance support: No upper extremity supported Standing balance-Leahy Scale: Fair Standing balance comment: static                    Cognition Arousal/Alertness: Awake/alert Behavior During Therapy: WFL for tasks assessed/performed Overall Cognitive Status: Within Functional Limits for tasks assessed                      Exercises      General Comments General comments (skin integrity, edema, etc.): Educated in fall prevention for home including foot wear, lighting, floors free of clutter, use of non slip surfaces in bathroom and keeping items frequently used in reach from shoulders to hip height      Pertinent Vitals/Pain Pain Assessment: No/denies pain    Home Living Family/patient expects to be discharged to:: Private residence Living Arrangements: Spouse/significant other Available Help at Discharge: Available 24 hours/day;Friend(s) Type of Home: House Home Access: Stairs to enter Entrance Stairs-Rails: None Home Layout: Two level;Able to live on main level with bedroom/bathroom Home Equipment: Shower seat - built in      Prior Function Level of Independence: Independent      Comments: report very active; likes to garden and this episode may be predicated from working long in garden without drinking water per pt/s.o.   PT Goals (current goals can now be found in the care plan section) Acute Rehab PT Goals Patient Stated Goal: To get stronger Progress towards PT goals: Progressing toward goals    Frequency  Min 4X/week    PT Plan Discharge plan needs to be updated  Co-evaluation             End of Session Equipment Utilized During Treatment: Gait belt Activity Tolerance: Patient tolerated treatment well Patient left: in chair;with call bell/phone within reach;with chair alarm set     Time: [id number] PT Time Calculation (min) (ACUTE ONLY): 20 min  Charges:  $Gait Training: 8-22 mins                    G Codes:      Sara Frye Jan 07, 2016, 2:08 PM

## 2015-12-18 NOTE — Progress Notes (Signed)
STROKE TEAM PROGRESS NOTE   SUBJECTIVE (INTERVAL HISTORY) No family is at the bedside.  She remains at baseline. No side effect from eliquis so far. Ready for discharge today. Pt is going back to [place] and will contact her neurologist in [place].    OBJECTIVE Temp:  [97.5 F (36.4 C)-98.7 F (37.1 C)] 98.7 F (37.1 C) (04/26 1023) Pulse Rate:  [51-97] 91 (04/26 1023) Cardiac Rhythm:  [-] Normal sinus rhythm (04/26 0705) Resp:  [16-20] 20 (04/26 1023) BP: (107-153)/(53-106) 119/60 mmHg (04/26 1023) SpO2:  [94 %-98 %] 97 % (04/26 1023)  CBC:   Recent Labs Lab 12/16/15 1823 12/18/15 0609  WBC 6.8 5.3  NEUTROABS 3.4  --   HGB 13.5  15.3* 13.4  HCT 40.3  45.0 40.1  MCV 84.5 84.4  PLT 235 [id number]    Basic Metabolic Panel:   Recent Labs Lab 12/16/15 1823 12/18/15 0609  NA 139  139 140  K 3.6  3.5 3.5  CL 101  100* 103  CO2 26 26  GLUCOSE 91  87 98  BUN 8  9 7   CREATININE 0.80  0.70 0.83  CALCIUM 9.4 9.1    Lipid Panel:     Component Value Date/Time   CHOL 136 12/17/2015 0436   TRIG 119 12/17/2015 0436   HDL 46 12/17/2015 0436   CHOLHDL 3.0 12/17/2015 0436   VLDL 24 12/17/2015 0436   LDLCALC 66 12/17/2015 0436   HgbA1c:  Lab Results  Component Value Date   HGBA1C 6.1* 12/17/2015   Urine Drug Screen:     Component Value Date/Time   LABOPIA NONE DETECTED 12/17/2015 1735   COCAINSCRNUR NONE DETECTED 12/17/2015 1735   LABBENZ NONE DETECTED 12/17/2015 1735   AMPHETMU NONE DETECTED 12/17/2015 1735   THCU NONE DETECTED 12/17/2015 1735   LABBARB NONE DETECTED 12/17/2015 1735      IMAGING I have personally reviewed the radiological images below and agree with the radiology interpretations.  Ct Head Wo Contrast 12/16/2015  IMPRESSION: No acute intracranial findings. Age-appropriate atrophy with mild periventricular white matter disease, likely due to chronic small vessel ischemic changes.   Carotid Doppler   Bilateral: No significant (1-39%) ICA stenosis.  Left distal ICA demonstrates elevated velocities, but no obvious plaque morphology noted. Could be elevated due tortuousity of vessel. Antegrade vertebral flow.    2D Echocardiogram  - Left ventricle: The cavity size was normal. Wall thickness was increased in a pattern of moderate LVH. Systolic function was normal. The estimated ejection fraction was in the range of 60% to 65%. Wall motion was normal; there were no regional wall motion abnormalities. Doppler parameters are consistent with abnormal left ventricular relaxation (grade 1 diastolic dysfunction). Doppler parameters are cnsistent with high ventricular filling pressure. - Mitral valve: Calcified annulus. - Left atrium: The atrium was severely dilated. Impressions:   Normal LV systolic function; moderate LVH; grade 1 diastolic dysfunction with elevated LV filling pressure; severe LAE; trace MR.  Dg Chest 2 View 12/17/2015  IMPRESSION: 1. Low lung volumes with mild bibasilar atelectasis and/or infiltrates. 2.  Mild cardiomegaly with normal pulmonary vascularity.    Mr Brain Wo Contrast 12/17/2015  IMPRESSION: 1. Positive for numerous mostly small acute infarcts in the bilateral MCA territories compatible with sequelae of emboli in this patient with atrial fibrillation. Most confluent involvement in the left superior parietal lobe. No associated hemorrhage or mass effect. 2. 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 Recommend Neuro-endovascular follow-up. 3. Otherwise mild for age nonspecific white matter and deep gray matter signal changes most commonly due to chronic small vessel disease.    PHYSICAL EXAM  Temp:  [97.5 F (36.4 C)-98.7 F (37.1 C)] 98.7 F (37.1 C) (04/26 1023) Pulse Rate:  [51-97] 91 (04/26 1023) Resp:  [16-20] 20 (04/26 1023) BP: (107-153)/(53-106) 119/60 mmHg (04/26 1023) SpO2:  [94 %-98 %] 97 % (04/26  1023)  General - Well nourished, well developed, in no apparent distress.  Ophthalmologic - Sharp disc margins OU.  Cardiovascular - Regular rate and rhythm with no murmur.  Mental Status -  Level of arousal and orientation to time, place, and person were intact. Language including expression, naming, repetition, comprehension was assessed and found intact. Fund of Knowledge was assessed and was intact.  Cranial Nerves II - XII - II - Visual field intact OU. III, IV, VI - Extraocular movements intact. V - Facial sensation intact bilaterally. VII - Facial movement intact bilaterally. VIII - Hearing & vestibular intact bilaterally. X - Palate elevates symmetrically. XI - Chin turning & shoulder shrug intact bilaterally. XII - Tongue protrusion intact.  Motor Strength - The patient's strength was normal in all extremities and pronator drift was absent.  Bulk was normal and fasciculations were absent.   Motor Tone - Muscle tone was assessed at the neck and appendages and was normal.  Reflexes - The patient's reflexes were 1+ in all extremities and she had no pathological reflexes.  Sensory - Light touch, temperature/pinprick were assessed and were symmetrical.    Coordination - The patient had normal movements in the hands and feet with no ataxia or dysmetria.  Tremor was absent.  Gait and Station - The patient's transfers, posture, gait, station, and turns were observed as normal.   ASSESSMENT/PLAN [name] is a 82 y.o. female with history of atrial fibrillation, hypertension and hyperlipidemia presenting with difficulty with speech output. She did not receive IV t-PA due to deficits resolved.   Stroke: bilateral anterior multifocal punctate infarcts, consistent with cardioembolic stroke due to afib not on [facility]  MRI  Bilateral MCA and ACA multifocal punctate infarcts   MRA  4-72mm ACOM aneurysm   Carotid Doppler  No significant stenosis   2D Echo  Mod LVH. EF  60-65%, no SOE  LDL 66, goal < 70   HgbA1c 6.1  Lovenox 40 mg sq daily for VTE prophylaxis Diet Heart Room service appropriate?: Yes; Fluid consistency:: Thin Diet - low sodium heart healthy  aspirin 81 mg daily prior to admission, now on Eliquis (apixaban) bid. Continue eliquis alone at discharge  Patient counseled to be compliant with her antithrombotic medications  Ongoing aggressive stroke risk factor management  Therapy recommendations:  HH PT  Disposition:  Home with therapies  Atrial Fibrillation not on [facility]  Home anticoagulation:  No anticoagulation, on aspirin 81 mg daily .   Had been on xarelto in the past, stopped due to LGIB.  Recommend trial of eliquis 5mg  bid as eliquis has less risk of GIB than Xarelto  Pt is in agreement  ACOM aneurysm  Incidental finding  4-5 mm in diameter  Pt does have migraine like HA at baseline  Will refer to Dr. Estanislado Pandy as outpt   Hypertension  Stable  BP goal normotensive  Hyperlipidemia  Home meds:  crestor 5 mg, resumed in hospital  LDL 66, at goal  Continue statin at discharge  Other Stroke Risk Factors  Advanced age  Obesity, Body mass index is 36.87 kg/(m^2).   NOTHING FURTHER TO ADD FROM THE STROKE STANDPOINT  Patient has a 10-15% risk of having another stroke over the next year, the highest risk is within 2 weeks of the most recent stroke/TIA (risk of having a stroke following a stroke or TIA is the same).  Ongoing risk factor control by Primary Care Physician  Stroke Service will sign off. Please call should any needs arise.  Follow-up Stroke Clinic at [facility] with Dr. Rosalin Hawking in 2 months, order placed.  Hospital day # 1  Rosalin Hawking, MD PhD Stroke Neurology 12/18/2015 9:58 PM    To contact Stroke Continuity provider, please refer to [email or web address] After hours, contact General Neurology

## 2015-12-18 NOTE — Evaluation (Signed)
SLP Cancellation Note  Patient Details Name: Sara Frye MRN: [id number] DOB: 08-12-1929   Cancelled treatment:       Reason Eval/Treat Not Completed: SLP screened, no needs identified, will sign off   Macario Golds 12/18/2015, 7:53 AM

## 2015-12-18 NOTE — Discharge Summary (Addendum)
Physician Discharge Summary  Sara Frye MRN: [id number] DOB/AGE: [birth year] 82 y.o.  PCP: No primary care provider on file.   Admit date: 12/16/2015 Discharge date: 12/18/2015  Discharge Diagnoses:     Principal Problem:   Speech disturbance Active Problems:   Paroxysmal atrial fibrillation (HCC)   Hypertension   Hyperlipidemia   Dizziness   Stroke (cerebrum) (HCC)    Follow-up recommendations Follow-up with PCP in 3-5 days , including all  additional recommended appointments as below Follow-up CBC, CMP in 3-5 days Patient needs to follow-up with neurology, interventional radiology for aneurysm and PCP after she returns to [place] in the next 1-2 weeks      Current Discharge Medication List    START taking these medications   Details  apixaban (ELIQUIS) 5 MG TABS tablet Take 1 tablet (5 mg total) by mouth 2 (two) times daily. Qty: 60 tablet, Refills: 11      CONTINUE these medications which have CHANGED   Details  rosuvastatin (CRESTOR) 5 MG tablet Take 1 tablet (5 mg total) by mouth daily at 6 PM. Qty: 30 tablet, Refills: 5      CONTINUE these medications which have NOT CHANGED   Details  hydrochlorothiazide (HYDRODIURIL) 25 MG tablet Take 25 mg by mouth daily.    meclizine (ANTIVERT) 25 MG tablet Take 12.5 mg by mouth 3 (three) times daily as needed for dizziness.      STOP taking these medications     aspirin 81 MG tablet          Discharge Condition: Stable    Discharge Instructions Get Medicines reviewed and adjusted: Please take all your medications with you for your next visit with your Primary MD  Please request your Primary MD to go over all hospital tests and procedure/radiological results at the follow up, please ask your Primary MD to get all Hospital records sent to his/her office.  If you experience worsening of your admission symptoms, develop shortness of breath, life threatening emergency, suicidal or homicidal thoughts you  must seek medical attention immediately by calling 911 or calling your MD immediately if symptoms less severe.  You must read complete instructions/literature along with all the possible adverse reactions/side effects for all the Medicines you take and that have been prescribed to you. Take any new Medicines after you have completely understood and accpet all the possible adverse reactions/side effects.   Do not drive when taking Pain medications.   Do not take more than prescribed Pain, Sleep and Anxiety Medications  Special Instructions: If you have smoked or chewed Tobacco in the last 2 yrs please stop smoking, stop any regular Alcohol and or any Recreational drug use.  Wear Seat belts while driving.  Please note  You were cared for by a hospitalist during your hospital stay. Once you are discharged, your primary care physician will handle any further medical issues. Please note that NO REFILLS for any discharge medications will be authorized once you are discharged, as it is imperative that you return to your primary care physician (or establish a relationship with a primary care physician if you do not have one) for your aftercare needs so that they can reassess your need for medications and monitor your lab values.  Discharge Instructions    Diet - low sodium heart healthy    Complete by:  As directed      Increase activity slowly    Complete by:  As directed  Allergies  Allergen Reactions  . Shellfish Allergy     Rash      Disposition:  patient recommended to go back to [place] with her family where she lives, and follow-up with neurology   Consults: Neurology    Significant Diagnostic Studies:  Dg Chest 2 View  12/17/2015  CLINICAL DATA:  Weakness. EXAM: CHEST  2 VIEW COMPARISON:  No prior. FINDINGS: Mediastinum and hilar structures normal. Low lung volumes with mild bibasilar atelectasis and/or infiltrates. Cardiomegaly with normal pulmonary  vascularity. No pleural effusion or pneumothorax. IMPRESSION: 1. Low lung volumes with mild bibasilar atelectasis and/or infiltrates. 2.  Mild cardiomegaly with normal pulmonary vascularity. Electronically Signed   By: Marcello Moores  Register   On: 12/17/2015 15:14   Ct Head Wo Contrast  12/16/2015  CLINICAL DATA:  82 year old with dizziness and confusion. Dehydration. EXAM: CT HEAD WITHOUT CONTRAST TECHNIQUE: Contiguous axial images were obtained from the base of the skull through the vertex without intravenous contrast. COMPARISON:  None. FINDINGS: Brain: There is no evidence of acute intracranial hemorrhage, mass lesion, brain edema or extra-axial fluid collection. The ventricles and subarachnoid spaces are appropriately sized for age. There is no CT evidence of acute cortical infarction. There is mild periventricular white matter disease, likely due to chronic small vessel ischemic changes. Intracranial vascular calcifications are noted. Bones/sinuses/visualized face: The visualized paranasal sinuses, mastoid air cells and middle ears are clear. The calvarium is intact. IMPRESSION: No acute intracranial findings. Age-appropriate atrophy with mild periventricular white matter disease, likely due to chronic small vessel ischemic changes. Electronically Signed   By: Richardean Sale M.D.   On: 12/16/2015 20:22   Mr Brain Wo Contrast  12/17/2015  CLINICAL DATA:  82 year old female with acute on set abnormal speech today since 0900 hours on the day of presentation. Dizziness. atrial fibrillation. Initial encounter. EXAM: MRI HEAD WITHOUT CONTRAST MRA HEAD WITHOUT CONTRAST TECHNIQUE: Multiplanar, multiecho pulse sequences of the brain and surrounding structures were obtained without intravenous contrast. Angiographic images of the head were obtained using MRA technique without contrast. COMPARISON:  Head CT without contrast 12/16/2015. FINDINGS: MRI HEAD FINDINGS Positive for multiple predominantly small foci of  restricted diffusion in both cerebral hemispheres. The bilateral MCA territories are affected, with significantly more involvement on the left. There is a patchy area of confluent restricted diffusion in the posterior superior left parietal lobe (series 3, image 36). There is left caudate nucleus involvement. No other deep gray matter nuclei involvement. No brainstem or posterior fossa involvement. Major intracranial vascular flow voids are preserved. Dominant appearing distal left vertebral artery. Mild T2 and FLAIR hyperintensity in the areas of acute restriction with no associated hemorrhage or mass effect. No midline shift, mass effect, evidence of mass lesion, ventriculomegaly, extra-axial collection or acute intracranial hemorrhage. Cervicomedullary junction and pituitary are within normal limits. Scattered additional nonspecific cerebral white matter T2 and FLAIR hyperintensity. No cortical encephalomalacia or chronic cerebral blood products identified. Mild for age T2 heterogeneity in the deep gray matter nuclei. Visible internal auditory structures appear normal. Mastoids are clear. No significant paranasal sinus mucosal thickening. Negative orbit and scalp soft tissues. Normal bone marrow signal. Negative for age visualized cervical spine. MRA HEAD FINDINGS Antegrade flow in the posterior circulation with dominant distal left vertebral artery that supplies the basilar. The non dominant right vertebral artery functionally terminates in PICA. Normal left PICA. Mild basilar artery irregularity without basilar artery stenosis. Fetal type bilateral PCA origins. Bilateral PCA branches are within normal limits. Antegrade flow in  both ICA siphons. Tortuous distal cervical right ICA. No siphon stenosis. Patent carotid termini. Normal ophthalmic and posterior communicating artery origins. Normal MCA and ACA origins. Bilateral M1 segments and MCA bifurcations are patent. No MCA branch occlusion identified. There is  enlargement of the anterior communicating artery up to 4 mm 4-5 mm. The ACA A1 segments and visualized bilateral ACA branches otherwise are within normal limits. IMPRESSION: 1. Positive for numerous mostly small acute infarcts in the bilateral MCA territories compatible with sequelae of emboli in this patient with atrial fibrillation. Most confluent involvement in the left superior parietal lobe. No associated hemorrhage or mass effect. 2. 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 Recommend Neuro-endovascular follow-up. 3. Otherwise mild for age nonspecific white matter and deep gray matter signal changes most commonly due to chronic small vessel disease. Electronically Signed   By: Genevie Ann M.D.   On: 12/17/2015 15:24   Mr Jodene Nam Head/brain Wo Cm  12/17/2015  CLINICAL DATA:  82 year old female with acute on set abnormal speech today since 0900 hours on the day of presentation. Dizziness. atrial fibrillation. Initial encounter. EXAM: MRI HEAD WITHOUT CONTRAST MRA HEAD WITHOUT CONTRAST TECHNIQUE: Multiplanar, multiecho pulse sequences of the brain and surrounding structures were obtained without intravenous contrast. Angiographic images of the head were obtained using MRA technique without contrast. COMPARISON:  Head CT without contrast 12/16/2015. FINDINGS: MRI HEAD FINDINGS Positive for multiple predominantly small foci of restricted diffusion in both cerebral hemispheres. The bilateral MCA territories are affected, with significantly more involvement on the left. There is a patchy area of confluent restricted diffusion in the posterior superior left parietal lobe (series 3, image 36). There is left caudate nucleus involvement. No other deep gray matter nuclei involvement. No brainstem or posterior fossa involvement. Major intracranial vascular flow voids are preserved. Dominant appearing distal left  vertebral artery. Mild T2 and FLAIR hyperintensity in the areas of acute restriction with no associated hemorrhage or mass effect. No midline shift, mass effect, evidence of mass lesion, ventriculomegaly, extra-axial collection or acute intracranial hemorrhage. Cervicomedullary junction and pituitary are within normal limits. Scattered additional nonspecific cerebral white matter T2 and FLAIR hyperintensity. No cortical encephalomalacia or chronic cerebral blood products identified. Mild for age T2 heterogeneity in the deep gray matter nuclei. Visible internal auditory structures appear normal. Mastoids are clear. No significant paranasal sinus mucosal thickening. Negative orbit and scalp soft tissues. Normal bone marrow signal. Negative for age visualized cervical spine. MRA HEAD FINDINGS Antegrade flow in the posterior circulation with dominant distal left vertebral artery that supplies the basilar. The non dominant right vertebral artery functionally terminates in PICA. Normal left PICA. Mild basilar artery irregularity without basilar artery stenosis. Fetal type bilateral PCA origins. Bilateral PCA branches are within normal limits. Antegrade flow in both ICA siphons. Tortuous distal cervical right ICA. No siphon stenosis. Patent carotid termini. Normal ophthalmic and posterior communicating artery origins. Normal MCA and ACA origins. Bilateral M1 segments and MCA bifurcations are patent. No MCA branch occlusion identified. There is enlargement of the anterior communicating artery up to 4 mm 4-5 mm. The ACA A1 segments and visualized bilateral ACA branches otherwise are within normal limits. IMPRESSION: 1. Positive for numerous mostly small acute infarcts in the bilateral MCA territories compatible with sequelae of emboli in this patient with atrial fibrillation. Most confluent involvement in the left superior parietal lobe. No associated hemorrhage or mass effect. 2. 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 Recommend Neuro-endovascular follow-up. 3. Otherwise mild for age nonspecific white matter and deep gray matter signal changes most commonly due to chronic small vessel disease. Electronically Signed   By: Genevie Ann M.D.   On: 12/17/2015 15:24      2-D echo  LV EF: 60% - 65%  ------------------------------------------------------------------- Indications: CVA 436.  ------------------------------------------------------------------- History: PMH: Atrial fibrillation. Risk factors: Hypertension. Dyslipidemia.  ------------------------------------------------------------------- Study Conclusions  - Left ventricle: The cavity size was normal. Wall thickness was  increased in a pattern of moderate LVH. Systolic function was  normal. The estimated ejection fraction was in the range of 60%  to 65%. Wall motion was normal; there were no regional wall  motion abnormalities. Doppler parameters are consistent with  abnormal left ventricular relaxation (grade 1 diastolic  dysfunction). Doppler parameters are consistent with high  ventricular filling pressure. - Mitral valve: Calcified annulus. - Left atrium: The atrium was severely dilated.  Impressions:  - Normal LV systolic function; moderate LVH; grade 1 diastolic  dysfunction with elevated LV filling pressure; severe LAE; trace  MR.  Filed Weights   12/16/15 1742 12/17/15 0227  Weight: 88.905 kg (196 lb) 85.639 kg (188 lb 12.8 oz)     Microbiology: No results found for this or any previous visit (from the past 240 hour(s)).           Labs: Results for orders placed or performed during the hospital encounter of 12/16/15 (from the past 48 hour(s))  I-stat troponin, ED (not at [facility])     Status: None   Collection Time: 12/16/15  6:22 PM  Result Value Ref Range   Troponin i, poc  0.00 0.00 - 0.08 ng/mL   Comment 3            Comment: Due to the release kinetics of cTnI, a negative result within the first hours of the onset of symptoms does not rule out myocardial infarction with certainty. If myocardial infarction is still suspected, repeat the test at appropriate intervals.   CBC     Status: None   Collection Time: 12/16/15  6:23 PM  Result Value Ref Range   WBC 6.8 4.0 - 10.5 K/uL   RBC 4.77 3.87 - 5.11 MIL/uL   Hemoglobin 13.5 12.0 - 15.0 g/dL   HCT 40.3 36.0 - 46.0 %   MCV 84.5 78.0 - 100.0 fL   MCH 28.3 26.0 - 34.0 pg   MCHC 33.5 30.0 - 36.0 g/dL   RDW 13.5 11.5 - 15.5 %   Platelets 235 150 - 400 K/uL  Differential     Status: None   Collection Time: 12/16/15  6:23 PM  Result Value Ref Range   Neutrophils Relative % 50 %   Neutro Abs 3.4 1.7 - 7.7 K/uL   Lymphocytes Relative 40 %   Lymphs Abs 2.7 0.7 - 4.0 K/uL   Monocytes Relative 7 %   Monocytes Absolute 0.5 0.1 - 1.0 K/uL   Eosinophils Relative 3 %   Eosinophils Absolute 0.2 0.0 - 0.7 K/uL   Basophils Relative 0 %   Basophils Absolute 0.0 0.0 - 0.1 K/uL  Comprehensive metabolic panel     Status: None   Collection Time: 12/16/15  6:23 PM  Result Value Ref Range   Sodium 139 135 - 145 mmol/L   Potassium 3.6 3.5 - 5.1 mmol/L  Chloride 101 101 - 111 mmol/L   CO2 26 22 - 32 mmol/L   Glucose, Bld 91 65 - 99 mg/dL   BUN 8 6 - 20 mg/dL   Creatinine, Ser 0.80 0.44 - 1.00 mg/dL   Calcium 9.4 8.9 - 10.3 mg/dL   Total Protein 7.5 6.5 - 8.1 g/dL   Albumin 4.2 3.5 - 5.0 g/dL   AST 19 15 - 41 U/L   ALT 18 14 - 54 U/L   Alkaline Phosphatase 58 38 - 126 U/L   Total Bilirubin 0.8 0.3 - 1.2 mg/dL   GFR calc non Af Amer >60 >60 mL/min   GFR calc Af Amer >60 >60 mL/min    Comment: (NOTE) The eGFR has been calculated using the CKD EPI equation. This calculation has not been validated in all clinical situations. eGFR's persistently <60 mL/min signify possible Chronic Kidney Disease.    Anion gap  12 5 - 15  I-Stat Chem 8, ED  (not at [facility])     Status: Abnormal   Collection Time: 12/16/15  6:23 PM  Result Value Ref Range   Sodium 139 135 - 145 mmol/L   Potassium 3.5 3.5 - 5.1 mmol/L   Chloride 100 (L) 101 - 111 mmol/L   BUN 9 6 - 20 mg/dL   Creatinine, Ser 0.70 0.44 - 1.00 mg/dL   Glucose, Bld 87 65 - 99 mg/dL   Calcium, Ion 1.15 1.13 - 1.30 mmol/L   TCO2 24 0 - 100 mmol/L   Hemoglobin 15.3 (H) 12.0 - 15.0 g/dL   HCT 45.0 36.0 - 46.0 %  Hemoglobin A1c     Status: Abnormal   Collection Time: 12/17/15  4:36 AM  Result Value Ref Range   Hgb A1c MFr Bld 6.1 (H) 4.8 - 5.6 %    Comment: (NOTE)         Pre-diabetes: 5.7 - 6.4         Diabetes: >6.4         Glycemic control for adults with diabetes: <7.0    Mean Plasma Glucose 128 mg/dL    Comment: (NOTE) Performed At: [facility] [place], [place] [id number] Lindon Romp MD YJ:[id number]   Lipid panel     Status: None   Collection Time: 12/17/15  4:36 AM  Result Value Ref Range   Cholesterol 136 0 - 200 mg/dL   Triglycerides 119 <150 mg/dL   HDL 46 >40 mg/dL   Total CHOL/HDL Ratio 3.0 RATIO   VLDL 24 0 - 40 mg/dL   LDL Cholesterol 66 0 - 99 mg/dL    Comment:        Total Cholesterol/HDL:CHD Risk Coronary Heart Disease Risk Table                     Men   Women  1/2 Average Risk   3.4   3.3  Average Risk       5.0   4.4  2 X Average Risk   9.6   7.1  3 X Average Risk  23.4   11.0        Use the calculated Patient Ratio above and the CHD Risk Table to determine the patient's CHD Risk.        ATP III CLASSIFICATION (LDL):  <100     mg/dL   Optimal  100-129  mg/dL   Near or Above  Optimal  130-159  mg/dL   Borderline  160-189  mg/dL   High  >190     mg/dL   Very High   Urine rapid drug screen (hosp performed)     Status: None   Collection Time: 12/17/15  5:35 PM  Result Value Ref Range   Opiates NONE DETECTED NONE DETECTED   Cocaine NONE DETECTED NONE  DETECTED   Benzodiazepines NONE DETECTED NONE DETECTED   Amphetamines NONE DETECTED NONE DETECTED   Tetrahydrocannabinol NONE DETECTED NONE DETECTED   Barbiturates NONE DETECTED NONE DETECTED    Comment:        DRUG SCREEN FOR MEDICAL PURPOSES ONLY.  IF CONFIRMATION IS NEEDED FOR ANY PURPOSE, NOTIFY LAB WITHIN 5 DAYS.        LOWEST DETECTABLE LIMITS FOR URINE DRUG SCREEN Drug Class       Cutoff (ng/mL) Amphetamine      1000 Barbiturate      200 Benzodiazepine   161 Tricyclics       096 Opiates          300 Cocaine          300 THC              50   Urinalysis, Routine w reflex microscopic (not at [facility])     Status: Abnormal   Collection Time: 12/17/15  5:35 PM  Result Value Ref Range   Color, Urine YELLOW YELLOW   APPearance CLEAR CLEAR   Specific Gravity, Urine 1.008 1.005 - 1.030   pH 6.0 5.0 - 8.0   Glucose, UA NEGATIVE NEGATIVE mg/dL   Hgb urine dipstick NEGATIVE NEGATIVE   Bilirubin Urine NEGATIVE NEGATIVE   Ketones, ur NEGATIVE NEGATIVE mg/dL   Protein, ur NEGATIVE NEGATIVE mg/dL   Nitrite NEGATIVE NEGATIVE   Leukocytes, UA SMALL (A) NEGATIVE  Urine microscopic-add on     Status: Abnormal   Collection Time: 12/17/15  5:35 PM  Result Value Ref Range   Squamous Epithelial / LPF NONE SEEN NONE SEEN   WBC, UA 0-5 0 - 5 WBC/hpf   RBC / HPF 0-5 0 - 5 RBC/hpf   Bacteria, UA RARE (A) NONE SEEN  CBC     Status: None   Collection Time: 12/18/15  6:09 AM  Result Value Ref Range   WBC 5.3 4.0 - 10.5 K/uL   RBC 4.75 3.87 - 5.11 MIL/uL   Hemoglobin 13.4 12.0 - 15.0 g/dL   HCT 40.1 36.0 - 46.0 %   MCV 84.4 78.0 - 100.0 fL   MCH 28.2 26.0 - 34.0 pg   MCHC 33.4 30.0 - 36.0 g/dL   RDW 13.5 11.5 - 15.5 %   Platelets 219 150 - 400 K/uL  Comprehensive metabolic panel     Status: Abnormal   Collection Time: 12/18/15  6:09 AM  Result Value Ref Range   Sodium 140 135 - 145 mmol/L   Potassium 3.5 3.5 - 5.1 mmol/L   Chloride 103 101 - 111 mmol/L   CO2 26 22 - 32 mmol/L    Glucose, Bld 98 65 - 99 mg/dL   BUN 7 6 - 20 mg/dL   Creatinine, Ser 0.83 0.44 - 1.00 mg/dL   Calcium 9.1 8.9 - 10.3 mg/dL   Total Protein 6.4 (L) 6.5 - 8.1 g/dL   Albumin 3.7 3.5 - 5.0 g/dL   AST 19 15 - 41 U/L   ALT 18 14 - 54 U/L   Alkaline Phosphatase 52 38 - 126  U/L   Total Bilirubin 1.1 0.3 - 1.2 mg/dL   GFR calc non Af Amer >60 >60 mL/min   GFR calc Af Amer >60 >60 mL/min    Comment: (NOTE) The eGFR has been calculated using the CKD EPI equation. This calculation has not been validated in all clinical situations. eGFR's persistently <60 mL/min signify possible Chronic Kidney Disease.    Anion gap 11 5 - 15     Lipid Panel     Component Value Date/Time   CHOL 136 12/17/2015 0436   TRIG 119 12/17/2015 0436   HDL 46 12/17/2015 0436   CHOLHDL 3.0 12/17/2015 0436   VLDL 24 12/17/2015 0436   LDLCALC 66 12/17/2015 0436     Lab Results  Component Value Date   HGBA1C 6.1* 12/17/2015     Lab Results  Component Value Date   LDLCALC 66 12/17/2015   CREATININE 0.83 12/18/2015     HPI : 82 y.o. female with a history of atrial fibrillation, hypertension and hyperlipidemia presenting with a complaint of difficulty with speech output earlier today. Symptom onset was at 9 AM 12/16/2015. Symptoms lasted for several hours. She also complained of dizziness for the past 2 days with vertigo. She had no focal deficits until earlier today. CT scan of her head showed no acute intracranial abnormality. She's been taking aspirin 81 mg per day. She was recently given a trial of Xarelto. This medication was discontinued because of rectal bleeding. Speech abnormalities resolved after arriving in the emergency room. NIH stroke score at the time of this evaluation was 0. She had no associated focal weakness nor facial asymmetry during the time she had speech difficulty. Speech was slightly slurred transiently, however. Patient was not administered IV t-PA secondary to deficits resolved. She was  admitted for further evaluation and treatment.  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 EF 60-65%, no SOE LDL 66, goal < 70  HgbA1c 6.1 Regular diet with thin liquids On aspirin 81 mg a day prior to admission, now converted to Eliquis 5 mg twice a day Physical therapy-no PT follow-up  Dyslipidemia- LDL 66, triglycerides 119, increase Crestor to 5 mg a day    Atrial fibrillation, paroxysmal - CHADS-VASc is 459(age x2, gender, HTN)  - Currently in a sinus bradycardia, rate controlled - Previously on Xarelto, reportedly discontinued following a GIB , now switched to Eliquis 5 mg twice a day Recommend thorough GI workup if the bleeding recurs   Hypertension - At goal currently  - Managed with HCTZ at home    ACOM aneurysm  Incidental finding  4-5 mm in diameter  Pt does have migraine like HA at baseline  Will refer to Dr. DEstanislado Pandyas outpt  Discharge Exam:    Blood pressure 140/83, pulse 53, temperature 97.5 F (36.4 C), temperature source Oral, resp. rate 16, height 5' (1.524 m), weight 85.639 kg (188 lb 12.8 oz), SpO2 97 %. Constitutional: NAD, calm, comfortable Eyes: PERTLA, lids and conjunctivae normal ENMT: Mucous membranes are moist. Posterior pharynx clear of any exudate or lesions.  Neck: normal, supple, no masses, no thyromegaly Respiratory: clear to auscultation bilaterally, no wheezing, no crackles. Normal respiratory effort. No accessory muscle use.  Cardiovascular: S1 & S2 heard, regular rate and rhythm, no murmurs / rubs / gallops.  No extremity edema. 2+ pedal pulses.  Abdomen: No distension, no tenderness, no masses palpated. Bowel sounds normal.  Musculoskeletal: no clubbing / cyanosis. No joint deformity upper  and lower extremities. Good ROM. Normal muscle tone.  Skin: no rashes, lesions, ulcers. No induration Neurologic: CN 2-12 grossly intact. Sensation intact, DTR normal. Strength 5/5 in all 4 limbs.  Psychiatric: Normal judgment and insight. Alert and oriented x 3. Normal mood.        SignedReyne Dumas 12/18/2015, 8:32 AM        Time spent >45 mins

## 2015-12-18 NOTE — Discharge Instructions (Signed)
You need follow-up with PCP, neurology, interventional radiology      Information on my medicine - ELIQUIS (apixaban)  This medication education was reviewed with me or my healthcare representative as part of my discharge preparation.   Why was Eliquis prescribed for you? Eliquis was prescribed for you to reduce the risk of a blood clot forming that can cause a stroke if you have a medical condition called atrial fibrillation (a type of irregular heartbeat).  What do You need to know about Eliquis ? Take your Eliquis TWICE DAILY - one tablet in the morning and one tablet in the evening with or without food. If you have difficulty swallowing the tablet whole please discuss with your pharmacist how to take the medication safely.  Take Eliquis exactly as prescribed by your doctor and DO NOT stop taking Eliquis without talking to the doctor who prescribed the medication.  Stopping may increase your risk of developing a stroke.  Refill your prescription before you run out.  After discharge, you should have regular check-up appointments with your healthcare provider that is prescribing your Eliquis.  In the future your dose may need to be changed if your kidney function or weight changes by a significant amount or as you get older.  What do you do if you miss a dose? If you miss a dose, take it as soon as you remember on the same day and resume taking twice daily.  Do not take more than one dose of ELIQUIS at the same time to make up a missed dose.  Important Safety Information A possible side effect of Eliquis is bleeding. You should call your healthcare provider right away if you experience any of the following: ? Bleeding from an injury or your nose that does not stop. ? Unusual colored urine (red or dark brown) or unusual colored stools (red or black). ? Unusual bruising for unknown reasons. ? A serious fall or if you hit your head (even if there is no bleeding).  Some medicines  may interact with Eliquis and might increase your risk of bleeding or clotting while on Eliquis. To help avoid this, consult your healthcare provider or pharmacist prior to using any new prescription or non-prescription medications, including herbals, vitamins, non-steroidal anti-inflammatory drugs (NSAIDs) and supplements.  This website has more information on Eliquis (apixaban): http://www.eliquis.com/eliquis/home

## 2015-12-18 NOTE — Care Management Note (Signed)
Case Management Note  Patient Details  Name: Sara Frye MRN: [id number] Date of Birth: [birth year]  Subjective/Objective:                    Action/Plan: Pt discharging home today with self care. Orders for cane. CM met with the pt and she would like the cane. Jermaine with [facility] notified and will deliver the equipment to the room. Will update the bedside RN.   Expected Discharge Date:                  Expected Discharge Plan:  Home/Self Care  In-House Referral:     Discharge planning Services  CM Consult  Post Acute Care Choice:  Durable Medical Equipment Choice offered to:  Patient  DME Arranged:  Kasandra Knudsen DME Agency:  [place]:    [facility] Agency:     Status of Service:  Completed, signed off  Medicare Important Message Given:    Date Medicare IM Given:    Medicare IM give by:    Date Additional Medicare IM Given:    Additional Medicare Important Message give by:     If discussed at [place] of Stay Meetings, dates discussed:    Additional Comments:  Pollie Friar, RN 12/18/2015, 11:43 AM

## 2015-12-18 NOTE — Progress Notes (Signed)
Pt discharged from hospital per orders from MD. Pt and spouse educated on discharge instructions. Pt and spouse verbalized understanding of discharge instructions. All questions and concerns were addressed. Pt's IV was removed before discharge. Pt exited hospital via wheelchair.

## 2015-12-23 DIAGNOSIS — I10 Essential (primary) hypertension: Secondary | ICD-10-CM | POA: Diagnosis not present

## 2015-12-23 DIAGNOSIS — I480 Paroxysmal atrial fibrillation: Secondary | ICD-10-CM | POA: Diagnosis not present

## 2015-12-25 DIAGNOSIS — E7800 Pure hypercholesterolemia, unspecified: Secondary | ICD-10-CM | POA: Diagnosis not present

## 2015-12-25 DIAGNOSIS — I10 Essential (primary) hypertension: Secondary | ICD-10-CM | POA: Diagnosis not present

## 2015-12-25 DIAGNOSIS — I480 Paroxysmal atrial fibrillation: Secondary | ICD-10-CM | POA: Diagnosis not present

## 2015-12-25 DIAGNOSIS — R9431 Abnormal electrocardiogram [ECG] [EKG]: Secondary | ICD-10-CM | POA: Diagnosis not present

## 2015-12-25 DIAGNOSIS — I6310 Cerebral infarction due to embolism of unspecified precerebral artery: Secondary | ICD-10-CM | POA: Diagnosis not present

## 2015-12-27 DIAGNOSIS — I6350 Cerebral infarction due to unspecified occlusion or stenosis of unspecified cerebral artery: Secondary | ICD-10-CM | POA: Diagnosis not present

## 2015-12-27 DIAGNOSIS — E784 Other hyperlipidemia: Secondary | ICD-10-CM | POA: Diagnosis not present

## 2015-12-27 DIAGNOSIS — I10 Essential (primary) hypertension: Secondary | ICD-10-CM | POA: Diagnosis not present

## 2016-01-01 DIAGNOSIS — I6310 Cerebral infarction due to embolism of unspecified precerebral artery: Secondary | ICD-10-CM | POA: Diagnosis not present

## 2016-01-01 DIAGNOSIS — I480 Paroxysmal atrial fibrillation: Secondary | ICD-10-CM | POA: Diagnosis not present

## 2016-01-02 DIAGNOSIS — I671 Cerebral aneurysm, nonruptured: Secondary | ICD-10-CM | POA: Diagnosis not present

## 2016-01-02 DIAGNOSIS — I639 Cerebral infarction, unspecified: Secondary | ICD-10-CM | POA: Diagnosis not present

## 2016-01-09 DIAGNOSIS — I679 Cerebrovascular disease, unspecified: Secondary | ICD-10-CM | POA: Diagnosis not present

## 2016-01-09 DIAGNOSIS — I6623 Occlusion and stenosis of bilateral posterior cerebral arteries: Secondary | ICD-10-CM | POA: Diagnosis not present

## 2016-01-10 DIAGNOSIS — Z800 Family history of malignant neoplasm of digestive organs: Secondary | ICD-10-CM | POA: Diagnosis not present

## 2016-01-10 DIAGNOSIS — R195 Other fecal abnormalities: Secondary | ICD-10-CM | POA: Diagnosis not present

## 2016-01-23 DIAGNOSIS — G467 Other lacunar syndromes: Secondary | ICD-10-CM | POA: Diagnosis not present

## 2016-01-27 DIAGNOSIS — E7800 Pure hypercholesterolemia, unspecified: Secondary | ICD-10-CM | POA: Diagnosis not present

## 2016-01-27 DIAGNOSIS — E039 Hypothyroidism, unspecified: Secondary | ICD-10-CM | POA: Diagnosis not present

## 2016-01-27 DIAGNOSIS — I10 Essential (primary) hypertension: Secondary | ICD-10-CM | POA: Diagnosis not present

## 2016-01-27 DIAGNOSIS — R7989 Other specified abnormal findings of blood chemistry: Secondary | ICD-10-CM | POA: Diagnosis not present

## 2016-01-27 DIAGNOSIS — I4891 Unspecified atrial fibrillation: Secondary | ICD-10-CM | POA: Diagnosis not present

## 2016-01-27 DIAGNOSIS — E559 Vitamin D deficiency, unspecified: Secondary | ICD-10-CM | POA: Diagnosis not present

## 2016-01-28 DIAGNOSIS — I10 Essential (primary) hypertension: Secondary | ICD-10-CM | POA: Diagnosis not present

## 2016-01-28 DIAGNOSIS — I6310 Cerebral infarction due to embolism of unspecified precerebral artery: Secondary | ICD-10-CM | POA: Diagnosis not present

## 2016-01-28 DIAGNOSIS — R9431 Abnormal electrocardiogram [ECG] [EKG]: Secondary | ICD-10-CM | POA: Diagnosis not present

## 2016-01-28 DIAGNOSIS — E7800 Pure hypercholesterolemia, unspecified: Secondary | ICD-10-CM | POA: Diagnosis not present

## 2016-01-28 DIAGNOSIS — E669 Obesity, unspecified: Secondary | ICD-10-CM | POA: Diagnosis not present

## 2016-03-03 DIAGNOSIS — K640 First degree hemorrhoids: Secondary | ICD-10-CM | POA: Diagnosis not present

## 2016-03-03 DIAGNOSIS — K5731 Diverticulosis of large intestine without perforation or abscess with bleeding: Secondary | ICD-10-CM | POA: Diagnosis not present

## 2016-03-03 DIAGNOSIS — R195 Other fecal abnormalities: Secondary | ICD-10-CM | POA: Diagnosis not present

## 2016-03-03 DIAGNOSIS — D120 Benign neoplasm of cecum: Secondary | ICD-10-CM | POA: Diagnosis not present

## 2016-03-03 DIAGNOSIS — Z800 Family history of malignant neoplasm of digestive organs: Secondary | ICD-10-CM | POA: Diagnosis not present

## 2016-03-06 ENCOUNTER — Ambulatory Visit: Payer: Medicare Other | Admitting: Neurology

## 2016-03-23 DIAGNOSIS — K5730 Diverticulosis of large intestine without perforation or abscess without bleeding: Secondary | ICD-10-CM | POA: Diagnosis not present

## 2016-03-23 DIAGNOSIS — D122 Benign neoplasm of ascending colon: Secondary | ICD-10-CM | POA: Diagnosis not present

## 2016-04-15 ENCOUNTER — Encounter: Payer: Self-pay | Admitting: Neurology

## 2016-04-15 ENCOUNTER — Ambulatory Visit (INDEPENDENT_AMBULATORY_CARE_PROVIDER_SITE_OTHER): Payer: Medicare Other | Admitting: Neurology

## 2016-04-15 VITALS — BP 124/61 | HR 57 | Ht 60.0 in | Wt 193.0 lb

## 2016-04-15 DIAGNOSIS — I6340 Cerebral infarction due to embolism of unspecified cerebral artery: Secondary | ICD-10-CM | POA: Diagnosis not present

## 2016-04-15 DIAGNOSIS — I480 Paroxysmal atrial fibrillation: Secondary | ICD-10-CM

## 2016-04-15 DIAGNOSIS — I639 Cerebral infarction, unspecified: Secondary | ICD-10-CM | POA: Diagnosis not present

## 2016-04-15 DIAGNOSIS — Z7901 Long term (current) use of anticoagulants: Secondary | ICD-10-CM

## 2016-04-15 DIAGNOSIS — I10 Essential (primary) hypertension: Secondary | ICD-10-CM

## 2016-04-15 DIAGNOSIS — E785 Hyperlipidemia, unspecified: Secondary | ICD-10-CM | POA: Diagnosis not present

## 2016-04-15 NOTE — Patient Instructions (Addendum)
-   continue eliquis and crestor for stroke prevention - follow up with Dr. Einar Gip for afib management - will refer to Dr. Estanislado Pandy regarding aneurysm management.  - recommend bridge with lovenox if need to stop eliquis for procedure.  - Follow up with your primary care physician for stroke risk factor modification. Recommend maintain blood pressure goal <130/80, diabetes with hemoglobin A1c goal below 7.0% and lipids with LDL cholesterol goal below 70 mg/dL.  - bleeding precautious - check BP at home  - avoid stress, anxiety and slow down to help tension HA. Can take tylenol for tension HA, avoid ibuprofen or naproxen.  - follow up in 4 months.

## 2016-04-16 DIAGNOSIS — Z7901 Long term (current) use of anticoagulants: Secondary | ICD-10-CM | POA: Insufficient documentation

## 2016-04-16 NOTE — Progress Notes (Signed)
STROKE NEUROLOGY FOLLOW UP NOTE  NAME: Sara Frye DOB: 1929-06-21  REASON FOR VISIT: stroke follow up HISTORY FROM: pt and chart   Today we had the pleasure of seeing Sara Frye in follow-up at our Neurology Clinic. Pt was accompanied by husband.   History Summary [name] is a 82 y.o. female with history of atrial fibrillation not on [facility], hypertension and hyperlipidemia admitted on 12/16/15 for difficulty with speech output. Symptoms resolved quickly. However, MRI showed bilateral MCA and ACA multifocal punctate infarcts, consistent with cardioembolic likely due to afib not on AC. MRA showed incidental 4-5 mm ACOM aneurysm. CUS and TTE unremarkable. LDL 66 and A1C 6.1. She was on Xarelto in the past but d/c due to GIB. She was put on eliquis this time and continued on crestor 5mg . She was discharged home in good condition.   Interval History During the interval time, the patient has been doing well. She is busy recently moving from [place] to here. She has a lot of stress and complains of neck pain stiffiness and tension HA, taking tylenol and helps. No stroke like symptoms. Worries about her aneurysm. BP 124/61. On eliquis, no GIB or other side effects.     REVIEW OF SYSTEMS: Full 14 system review of systems performed and notable only for those listed below and in HPI above, all others are negative:  Constitutional:   Cardiovascular:  Ear/Nose/Throat:   Skin:  Eyes:   Respiratory:   Gastroitestinal:   Genitourinary: frequency of urination Hematology/Lymphatic:   Endocrine:  Musculoskeletal:  Waking difficulty, neck stiffness Allergy/Immunology:   Neurological:  HA Psychiatric:  Sleep: restless leg  The following represents the patient's updated allergies and side effects list: Allergies  Allergen Reactions  . Peanut-Containing Drug Products   . Shellfish Allergy     Rash    The neurologically relevant items on the patient's problem list  were reviewed on today's visit.  Neurologic Examination  A problem focused neurological exam (12 or more points of the single system neurologic examination, vital signs counts as 1 point, cranial nerves count for 8 points) was performed.  Blood pressure 124/61, pulse (!) 57, height 5' (1.524 m), weight 193 lb (87.5 kg).  General - Well nourished, well developed, in no apparent distress.  Ophthalmologic - Sharp disc margins OU.   Cardiovascular - Regular rate and rhythm with no murmur.  Mental Status -  Level of arousal and orientation to time, place, and person were intact. Language including expression, naming, repetition, comprehension was assessed and found intact. Fund of Knowledge was assessed and was intact.  Cranial Nerves II - XII - II - Visual field intact OU. III, IV, VI - Extraocular movements intact. V - Facial sensation intact bilaterally. VII - Facial movement intact bilaterally. VIII - Hearing & vestibular intact bilaterally. X - Palate elevates symmetrically. XI - Chin turning & shoulder shrug intact bilaterally. XII - Tongue protrusion intact.  Motor Strength - The patient's strength was normal in all extremities and pronator drift was absent.  Bulk was normal and fasciculations were absent.   Motor Tone - Muscle tone was assessed at the neck and appendages and was normal.  Reflexes - The patient's reflexes were 1+ in all extremities and she had no pathological reflexes.  Sensory - Light touch, temperature/pinprick, vibration and proprioception, and Romberg testing were assessed and were normal.    Coordination - The patient had normal movements in the hands and feet with  no ataxia or dysmetria.  Tremor was absent.  Gait and Station - The patient's transfers, posture, gait, station, and turns were observed as normal.   Functional score  mRS = 0   0 - No symptoms.   1 - No significant disability. Able to carry out all usual activities, despite some  symptoms.   2 - Slight disability. Able to look after own affairs without assistance, but unable to carry out all previous activities.   3 - Moderate disability. Requires some help, but able to walk unassisted.   4 - Moderately severe disability. Unable to attend to own bodily needs without assistance, and unable to walk unassisted.   5 - Severe disability. Requires constant nursing care and attention, bedridden, incontinent.   6 - Dead.   NIH Stroke Scale = 0   Data reviewed: I personally reviewed the images and agree with the radiology interpretations.  Ct Head Wo Contrast 12/16/2015  IMPRESSION: No acute intracranial findings. Age-appropriate atrophy with mild periventricular white matter disease, likely due to chronic small vessel ischemic changes.   Carotid Doppler   Bilateral: No significant (1-39%) ICA stenosis. Left distal ICA demonstrates elevated velocities, but no obvious plaque morphology noted. Could be elevated due tortuousity of vessel. Antegrade vertebral flow.    2D Echocardiogram  - Left ventricle: The cavity size was normal. Wall thickness was increased in a pattern of moderate LVH. Systolic function was normal. The estimated ejection fraction was in the range of 60% to 65%. Wall motion was normal; there were no regional wall motion abnormalities. Doppler parameters are consistent with abnormal left ventricular relaxation (grade 1 diastolic dysfunction). Doppler parameters are cnsistent with high ventricular filling pressure. - Mitral valve: Calcified annulus. - Left atrium: The atrium was severely dilated. Impressions:   Normal LV systolic function; moderate LVH; grade 1 diastolic dysfunction with elevated LV filling pressure; severe LAE; trace MR.  Dg Chest 2 View 12/17/2015  IMPRESSION: 1. Low lung volumes with mild bibasilar atelectasis and/or infiltrates. 2.  Mild cardiomegaly with normal pulmonary vascularity.    Mr Brain Wo Contrast 12/17/2015   IMPRESSION: 1. Positive for numerous mostly small acute infarcts in the bilateral MCA territories compatible with sequelae of emboli in this patient with atrial fibrillation. Most confluent involvement in the left superior parietal lobe. No associated hemorrhage or mass effect. 2. 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 Recommend Neuro-endovascular follow-up. 3. Otherwise mild for age nonspecific white matter and deep gray matter signal changes most commonly due to chronic small vessel disease.   Component     Latest Ref Rng & Units 12/17/2015  Cholesterol     0 - 200 mg/dL 136  Triglycerides     <150 mg/dL 119  HDL Cholesterol     >40 mg/dL 46  Total CHOL/HDL Ratio     RATIO 3.0  VLDL     0 - 40 mg/dL 24  LDL (calc)     0 - 99 mg/dL 66  Hemoglobin A1C     4.8 - 5.6 % 6.1 (H)  Mean Plasma Glucose     mg/dL 128    Assessment: As you may recall, she is a 82 y.o. Caucasian female with PMH of atrial fibrillation not on AC, hypertension and hyperlipidemia admitted on 12/16/15 for bilateral MCA and ACA multifocal punctate infarcts, consistent with cardioembolic likely due to afib not on AC. MRA showed incidental 4-5  mm ACOM aneurysm. CUS and TTE unremarkable. LDL 66 and A1C 6.1. She was on Xarelto in the past but d/c due to GIB. She was put on eliquis this time and continued on crestor 5mg . So far no GIB or other side effects. Busy moving from [place] to here and complains of tension HA.   Plan:  - continue eliquis and crestor for stroke prevention - follow up with Dr. Einar Gip for afib management - will refer to Dr. Estanislado Pandy regarding aneurysm management.  - recommend bridge with lovenox if need to stop eliquis for procedure.  - Follow up with your primary care physician for stroke risk factor modification. Recommend maintain blood pressure goal <130/80, diabetes with  hemoglobin A1c goal below 7.0% and lipids with LDL cholesterol goal below 70 mg/dL.  - bleeding precautious - check BP at home  - avoid stress, anxiety and slow down to help tension HA. Can take tylenol for tension HA, avoid ibuprofen or naproxen.  - follow up in 4 months.   I spent more than 25 minutes of face to face time with the patient. Greater than 50% of time was spent in counseling and coordination of care. We discussed about continue eliquis, refer to Dr. Estanislado Pandy for aneurysm management and tension headache treatment.   No orders of the defined types were placed in this encounter.   Meds ordered this encounter  Medications  . levothyroxine (SYNTHROID, LEVOTHROID) 25 MCG tablet    Sig: Take 25 mcg by mouth daily.    Refill:  0    Patient Instructions  - continue eliquis and crestor for stroke prevention - follow up with Dr. Einar Gip for afib management - will refer to Dr. Estanislado Pandy regarding aneurysm management.  - recommend bridge with lovenox if need to stop eliquis for procedure.  - Follow up with your primary care physician for stroke risk factor modification. Recommend maintain blood pressure goal <130/80, diabetes with hemoglobin A1c goal below 7.0% and lipids with LDL cholesterol goal below 70 mg/dL.  - bleeding precautious - check BP at home  - avoid stress, anxiety and slow down to help tension HA. Can take tylenol for tension HA, avoid ibuprofen or naproxen.  - follow up in 4 months.    Rosalin Hawking, MD PhD [facility] [address], [place], [place] [address] [phone]

## 2016-05-07 DIAGNOSIS — E784 Other hyperlipidemia: Secondary | ICD-10-CM | POA: Diagnosis not present

## 2016-05-07 DIAGNOSIS — I10 Essential (primary) hypertension: Secondary | ICD-10-CM | POA: Diagnosis not present

## 2016-05-07 DIAGNOSIS — D508 Other iron deficiency anemias: Secondary | ICD-10-CM | POA: Diagnosis not present

## 2016-05-07 DIAGNOSIS — E039 Hypothyroidism, unspecified: Secondary | ICD-10-CM | POA: Diagnosis not present

## 2016-05-07 DIAGNOSIS — Z1329 Encounter for screening for other suspected endocrine disorder: Secondary | ICD-10-CM | POA: Diagnosis not present

## 2016-05-07 DIAGNOSIS — I6350 Cerebral infarction due to unspecified occlusion or stenosis of unspecified cerebral artery: Secondary | ICD-10-CM | POA: Diagnosis not present

## 2016-05-08 DIAGNOSIS — J309 Allergic rhinitis, unspecified: Secondary | ICD-10-CM | POA: Diagnosis not present

## 2016-05-08 DIAGNOSIS — G5600 Carpal tunnel syndrome, unspecified upper limb: Secondary | ICD-10-CM | POA: Diagnosis not present

## 2016-05-11 DIAGNOSIS — G5730 Lesion of lateral popliteal nerve, unspecified lower limb: Secondary | ICD-10-CM | POA: Diagnosis not present

## 2016-05-11 DIAGNOSIS — G609 Hereditary and idiopathic neuropathy, unspecified: Secondary | ICD-10-CM | POA: Diagnosis not present

## 2016-06-25 DIAGNOSIS — I480 Paroxysmal atrial fibrillation: Secondary | ICD-10-CM | POA: Diagnosis not present

## 2016-06-25 DIAGNOSIS — R42 Dizziness and giddiness: Secondary | ICD-10-CM | POA: Diagnosis not present

## 2016-08-31 ENCOUNTER — Encounter: Payer: Self-pay | Admitting: Neurology

## 2016-08-31 ENCOUNTER — Ambulatory Visit (INDEPENDENT_AMBULATORY_CARE_PROVIDER_SITE_OTHER): Payer: Medicare Other | Admitting: Neurology

## 2016-08-31 VITALS — BP 146/75 | HR 63 | Wt 186.6 lb

## 2016-08-31 DIAGNOSIS — I6340 Cerebral infarction due to embolism of unspecified cerebral artery: Secondary | ICD-10-CM | POA: Diagnosis not present

## 2016-08-31 DIAGNOSIS — I10 Essential (primary) hypertension: Secondary | ICD-10-CM | POA: Diagnosis not present

## 2016-08-31 DIAGNOSIS — I671 Cerebral aneurysm, nonruptured: Secondary | ICD-10-CM

## 2016-08-31 DIAGNOSIS — I480 Paroxysmal atrial fibrillation: Secondary | ICD-10-CM | POA: Diagnosis not present

## 2016-08-31 DIAGNOSIS — Z7901 Long term (current) use of anticoagulants: Secondary | ICD-10-CM | POA: Diagnosis not present

## 2016-08-31 NOTE — Progress Notes (Signed)
STROKE NEUROLOGY FOLLOW UP NOTE  NAME: Sara Frye DOB: 1929/01/16  REASON FOR VISIT: stroke follow up HISTORY FROM: pt and chart   Today we had the pleasure of seeing Sara Frye in follow-up at our Neurology Clinic. Pt was accompanied by husband.   History Summary Sara Frye is a 83 y.o. female with history of atrial fibrillation not on [facility], hypertension and hyperlipidemia admitted on 12/16/15 for difficulty with speech output. Symptoms resolved quickly. However, MRI showed bilateral MCA and ACA multifocal punctate infarcts, consistent with cardioembolic likely due to afib not on AC. MRA showed incidental 4-5 mm ACOM aneurysm. CUS and TTE unremarkable. LDL 66 and A1C 6.1. She was on Xarelto in the past but d/c due to GIB. She was put on eliquis this time and continued on crestor 5mg . She was discharged home in good condition.   Follow up 04/15/16 - She is busy recently moving from [place] to here. She has a lot of stress and complains of neck pain stiffiness and tension HA, taking tylenol and helps. No stroke like symptoms. Worries about her aneurysm. BP 124/61. On eliquis, no GIB or other side effects.  Interval History During the interval time, the patient has been doing well. No complains, no bleeding. Pt complains occasional sharp pain in the brain. Has not been contacted by Dr. Estanislado Pandy. And has not seen Dr. Einar Gip yet. Just finished relocation from [place]. BP at home stable 130/85. Today in clinic 146/75.   REVIEW OF SYSTEMS: Full 14 system review of systems performed and notable only for those listed below and in HPI above, all others are negative:  Constitutional:   Cardiovascular:  Ear/Nose/Throat:   Skin:  Eyes:   Respiratory:   Gastroitestinal:   Genitourinary:  Hematology/Lymphatic:   Endocrine:  Musculoskeletal:   Allergy/Immunology:   Neurological:   Psychiatric:  Sleep:   The following represents the patient's updated allergies and side  effects list: Allergies  Allergen Reactions  . Peanut-Containing Drug Products   . Shellfish Allergy     Rash    The neurologically relevant items on the patient's problem list were reviewed on today's visit.  Neurologic Examination  A problem focused neurological exam (12 or more points of the single system neurologic examination, vital signs counts as 1 point, cranial nerves count for 8 points) was performed.  Blood pressure (!) 146/75, pulse 63, weight 186 lb 9.6 oz (84.6 kg).  General - Well nourished, well developed, in no apparent distress.  Ophthalmologic - Sharp disc margins OU.   Cardiovascular - Regular rate and rhythm with no murmur.  Mental Status -  Level of arousal and orientation to time, place, and person were intact. Language including expression, naming, repetition, comprehension was assessed and found intact. Fund of Knowledge was assessed and was intact.  Cranial Nerves II - XII - II - Visual field intact OU. III, IV, VI - Extraocular movements intact. V - Facial sensation intact bilaterally. VII - Facial movement intact bilaterally. VIII - Hearing & vestibular intact bilaterally. X - Palate elevates symmetrically. XI - Chin turning & shoulder shrug intact bilaterally. XII - Tongue protrusion intact.  Motor Strength - The patient's strength was normal in all extremities and pronator drift was absent.  Bulk was normal and fasciculations were absent.   Motor Tone - Muscle tone was assessed at the neck and appendages and was normal.  Reflexes - The patient's reflexes were 1+ in all extremities and she had no  pathological reflexes.  Sensory - Light touch, temperature/pinprick, vibration and proprioception, and Romberg testing were assessed and were normal.    Coordination - The patient had normal movements in the hands and feet with no ataxia or dysmetria.  Tremor was absent.  Gait and Station - The patient's transfers, posture, gait, station, and turns  were observed as normal.   Data reviewed: I personally reviewed the images and agree with the radiology interpretations.  Ct Head Wo Contrast 12/16/2015  IMPRESSION: No acute intracranial findings. Age-appropriate atrophy with mild periventricular white matter disease, likely due to chronic small vessel ischemic changes.   Carotid Doppler   Bilateral: No significant (1-39%) ICA stenosis. Left distal ICA demonstrates elevated velocities, but no obvious plaque morphology noted. Could be elevated due tortuousity of vessel. Antegrade vertebral flow.    2D Echocardiogram  - Left ventricle: The cavity size was normal. Wall thickness was increased in a pattern of moderate LVH. Systolic function was normal. The estimated ejection fraction was in the range of 60% to 65%. Wall motion was normal; there were no regional wall motion abnormalities. Doppler parameters are consistent with abnormal left ventricular relaxation (grade 1 diastolic dysfunction). Doppler parameters are cnsistent with high ventricular filling pressure. - Mitral valve: Calcified annulus. - Left atrium: The atrium was severely dilated. Impressions:   Normal LV systolic function; moderate LVH; grade 1 diastolic dysfunction with elevated LV filling pressure; severe LAE; trace MR.  Dg Chest 2 View 12/17/2015  IMPRESSION: 1. Low lung volumes with mild bibasilar atelectasis and/or infiltrates. 2.  Mild cardiomegaly with normal pulmonary vascularity.    Mr Brain Wo Contrast 12/17/2015  IMPRESSION: 1. Positive for numerous mostly small acute infarcts in the bilateral MCA territories compatible with sequelae of emboli in this patient with atrial fibrillation. Most confluent involvement in the left superior parietal lobe. No associated hemorrhage or mass effect. 2. 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 Recommend  Neuro-endovascular follow-up. 3. Otherwise mild for age nonspecific white matter and deep gray matter signal changes most commonly due to chronic small vessel disease.   Component     Latest Ref Rng & Units 12/17/2015  Cholesterol     0 - 200 mg/dL 136  Triglycerides     <150 mg/dL 119  HDL Cholesterol     >40 mg/dL 46  Total CHOL/HDL Ratio     RATIO 3.0  VLDL     0 - 40 mg/dL 24  LDL (calc)     0 - 99 mg/dL 66  Hemoglobin A1C     4.8 - 5.6 % 6.1 (H)  Mean Plasma Glucose     mg/dL 128    Assessment: As you may recall, she is a 83 y.o. Caucasian female with PMH of atrial fibrillation not on AC, hypertension and hyperlipidemia admitted on 12/16/15 for bilateral MCA and ACA multifocal punctate infarcts, consistent with cardioembolic likely due to afib not on AC. MRA showed incidental 4-5 mm ACOM aneurysm. CUS and TTE unremarkable. LDL 66 and A1C 6.1. She was on Xarelto in the past but d/c due to GIB. She was put on eliquis this time and continued on crestor 5mg . So far no GIB or other side effects. Just relocated from [place] to here. Has not seen Dr. Estanislado Pandy yet for aneurysm. Will repeat MRA this time for monitoring.   Plan:  - continue eliquis and crestor for stroke prevention -  follow up with Dr. Einar Gip for afib management - will contact Dr. Estanislado Pandy regarding aneurysm management.  - recommend bridge with lovenox if need to stop eliquis for procedure.  - Follow up with your primary care physician for stroke risk factor modification. Recommend maintain blood pressure goal <130/80, diabetes with hemoglobin A1c goal below 7.0% and lipids with LDL cholesterol goal below 70 mg/dL.  - bleeding precautious - check BP at home  - will do MRA head to monitoring aneurysm.  - follow up with Sara Frye or Sara Frye in 6 months.   I spent more than 25 minutes of face to face time with the patient. Greater than 50% of time was spent in counseling and coordination of care. We discussed about  compliance with eliquis, contact Dr. Estanislado Pandy for aneurysm management and repeat MRA head   Orders Placed This Encounter  Procedures  . MR MRA HEAD WO CONTRAST    Standing Status:   Future    Standing Expiration Date:   11/02/2017    Order Specific Question:   Reason for Exam (SYMPTOM  OR DIAGNOSIS REQUIRED)    Answer:   ACOM aneurysm    Order Specific Question:   Preferred imaging location?    Answer:   Internal    Order Specific Question:   Does the patient have a pacemaker or implanted devices?    Answer:   No    Order Specific Question:   What is the patient's sedation requirement?    Answer:   No Sedation    No orders of the defined types were placed in this encounter.   Patient Instructions  - continue eliquis and crestor for stroke prevention - follow up with Dr. Einar Gip for afib management - will contact Dr. Estanislado Pandy regarding aneurysm management.  - recommend bridge with lovenox if need to stop eliquis for procedure.  - Follow up with your primary care physician for stroke risk factor modification. Recommend maintain blood pressure goal <130/80, diabetes with hemoglobin A1c goal below 7.0% and lipids with LDL cholesterol goal below 70 mg/dL.  - bleeding precautious - check BP at home  - will do MRA head to monitoring aneurysm.  - follow up in 6 months.     Rosalin Hawking, MD PhD [facility] [address], [place], [place] [address] [phone]

## 2016-08-31 NOTE — Patient Instructions (Addendum)
-   continue eliquis and crestor for stroke prevention - follow up with Dr. Einar Gip for afib management - will contact Dr. Estanislado Pandy regarding aneurysm management.  - recommend bridge with lovenox if need to stop eliquis for procedure.  - Follow up with your primary care physician for stroke risk factor modification. Recommend maintain blood pressure goal <130/80, diabetes with hemoglobin A1c goal below 7.0% and lipids with LDL cholesterol goal below 70 mg/dL.  - bleeding precautious - check BP at home  - will do MRA head to monitoring aneurysm.  - follow up in 6 months.

## 2016-09-06 ENCOUNTER — Ambulatory Visit
Admission: RE | Admit: 2016-09-06 | Discharge: 2016-09-06 | Disposition: A | Discharge status: Home / Self Care | Payer: Medicare Other | Source: Ambulatory Visit | Attending: Neurology | Admitting: Neurology

## 2016-09-06 DIAGNOSIS — I671 Cerebral aneurysm, nonruptured: Secondary | ICD-10-CM | POA: Diagnosis not present

## 2016-09-07 ENCOUNTER — Telehealth (HOSPITAL_COMMUNITY): Payer: Self-pay

## 2016-09-07 NOTE — Telephone Encounter (Signed)
Called to schedule consult. Pt stated that she was going to [place] for a couple weeks. She will call when she gets back in town to schedule consult. AW

## 2016-09-08 ENCOUNTER — Telehealth: Payer: Self-pay

## 2016-09-08 NOTE — Telephone Encounter (Signed)
-----   Message from Rosalin Hawking, MD sent at 09/06/2016  6:33 PM EST ----- Could you please let the patient know that the MRA head done recently showed stable 44mm aneurysm, no enlargement or change of size. Please continue current treatment. Please let her know that Dr. Estanislado Pandy office will call her for appointment. Thanks.  Rosalin Hawking, MD PhD Stroke Neurology 09/06/2016 6:32 PM

## 2016-09-08 NOTE — Telephone Encounter (Signed)
Rn call patient back about the MRA head. Rn stated per DR. Xu note the test showed 68mm aneurysm, no enlargement or change of size . Continue treatment plan.  Rn ask pt about her appt with Dr. Estanislado Pandy for an appointment. Pt stated Anderson Malta from Dr. Estanislado Pandy office did call her about an appt. Pt stated she is going back to [place] this weekend. Pt stated she has the number and address to reschedule the appt.

## 2016-10-20 DIAGNOSIS — R9431 Abnormal electrocardiogram [ECG] [EKG]: Secondary | ICD-10-CM | POA: Diagnosis not present

## 2016-10-20 DIAGNOSIS — E7800 Pure hypercholesterolemia, unspecified: Secondary | ICD-10-CM | POA: Diagnosis not present

## 2016-10-20 DIAGNOSIS — I480 Paroxysmal atrial fibrillation: Secondary | ICD-10-CM | POA: Diagnosis not present

## 2016-10-20 DIAGNOSIS — I10 Essential (primary) hypertension: Secondary | ICD-10-CM | POA: Diagnosis not present

## 2016-10-23 DIAGNOSIS — E784 Other hyperlipidemia: Secondary | ICD-10-CM | POA: Diagnosis not present

## 2016-10-23 DIAGNOSIS — E039 Hypothyroidism, unspecified: Secondary | ICD-10-CM | POA: Diagnosis not present

## 2016-10-23 DIAGNOSIS — I499 Cardiac arrhythmia, unspecified: Secondary | ICD-10-CM | POA: Diagnosis not present

## 2016-10-23 DIAGNOSIS — Z0000 Encounter for general adult medical examination without abnormal findings: Secondary | ICD-10-CM | POA: Diagnosis not present

## 2016-10-24 DIAGNOSIS — E039 Hypothyroidism, unspecified: Secondary | ICD-10-CM | POA: Diagnosis not present

## 2016-10-24 DIAGNOSIS — E785 Hyperlipidemia, unspecified: Secondary | ICD-10-CM | POA: Diagnosis not present

## 2016-10-24 DIAGNOSIS — I10 Essential (primary) hypertension: Secondary | ICD-10-CM | POA: Diagnosis not present

## 2016-10-24 DIAGNOSIS — E119 Type 2 diabetes mellitus without complications: Secondary | ICD-10-CM | POA: Diagnosis not present

## 2016-10-24 DIAGNOSIS — E559 Vitamin D deficiency, unspecified: Secondary | ICD-10-CM | POA: Diagnosis not present

## 2016-10-26 ENCOUNTER — Other Ambulatory Visit (HOSPITAL_COMMUNITY): Payer: Self-pay | Admitting: Interventional Radiology

## 2016-10-26 DIAGNOSIS — D122 Benign neoplasm of ascending colon: Secondary | ICD-10-CM | POA: Diagnosis not present

## 2016-10-26 DIAGNOSIS — I729 Aneurysm of unspecified site: Secondary | ICD-10-CM

## 2016-10-26 DIAGNOSIS — K625 Hemorrhage of anus and rectum: Secondary | ICD-10-CM | POA: Diagnosis not present

## 2016-11-06 DIAGNOSIS — I10 Essential (primary) hypertension: Secondary | ICD-10-CM | POA: Diagnosis not present

## 2016-11-24 ENCOUNTER — Other Ambulatory Visit (HOSPITAL_COMMUNITY): Payer: Self-pay | Admitting: Interventional Radiology

## 2016-11-24 ENCOUNTER — Ambulatory Visit (HOSPITAL_COMMUNITY)
Admission: RE | Admit: 2016-11-24 | Discharge: 2016-11-24 | Disposition: A | Discharge status: Home / Self Care | Payer: Medicare Other | Source: Ambulatory Visit | Attending: Interventional Radiology | Admitting: Interventional Radiology

## 2016-11-24 DIAGNOSIS — I671 Cerebral aneurysm, nonruptured: Secondary | ICD-10-CM | POA: Diagnosis not present

## 2016-11-24 DIAGNOSIS — I729 Aneurysm of unspecified site: Secondary | ICD-10-CM

## 2016-11-24 HISTORY — PX: IR RADIOLOGIST EVAL & MGMT: IMG5224

## 2016-11-25 ENCOUNTER — Encounter (HOSPITAL_COMMUNITY): Payer: Self-pay | Admitting: Interventional Radiology

## 2016-12-07 DIAGNOSIS — I6523 Occlusion and stenosis of bilateral carotid arteries: Secondary | ICD-10-CM | POA: Diagnosis not present

## 2016-12-07 DIAGNOSIS — I10 Essential (primary) hypertension: Secondary | ICD-10-CM | POA: Diagnosis not present

## 2016-12-07 DIAGNOSIS — D508 Other iron deficiency anemias: Secondary | ICD-10-CM | POA: Diagnosis not present

## 2016-12-07 DIAGNOSIS — R0602 Shortness of breath: Secondary | ICD-10-CM | POA: Diagnosis not present

## 2016-12-08 DIAGNOSIS — K5730 Diverticulosis of large intestine without perforation or abscess without bleeding: Secondary | ICD-10-CM | POA: Diagnosis not present

## 2016-12-08 DIAGNOSIS — K625 Hemorrhage of anus and rectum: Secondary | ICD-10-CM | POA: Diagnosis not present

## 2016-12-08 DIAGNOSIS — K641 Second degree hemorrhoids: Secondary | ICD-10-CM | POA: Diagnosis not present

## 2016-12-16 ENCOUNTER — Telehealth (HOSPITAL_COMMUNITY): Payer: Self-pay | Admitting: Radiology

## 2016-12-16 DIAGNOSIS — Z91013 Allergy to seafood: Secondary | ICD-10-CM | POA: Diagnosis not present

## 2016-12-16 DIAGNOSIS — I4891 Unspecified atrial fibrillation: Secondary | ICD-10-CM | POA: Diagnosis not present

## 2016-12-16 DIAGNOSIS — E7800 Pure hypercholesterolemia, unspecified: Secondary | ICD-10-CM | POA: Diagnosis not present

## 2016-12-16 DIAGNOSIS — Z91010 Allergy to peanuts: Secondary | ICD-10-CM | POA: Diagnosis not present

## 2016-12-16 DIAGNOSIS — N390 Urinary tract infection, site not specified: Secondary | ICD-10-CM | POA: Diagnosis not present

## 2016-12-16 DIAGNOSIS — R3915 Urgency of urination: Secondary | ICD-10-CM | POA: Diagnosis not present

## 2016-12-16 DIAGNOSIS — Z6837 Body mass index (BMI) 37.0-37.9, adult: Secondary | ICD-10-CM | POA: Diagnosis not present

## 2016-12-16 DIAGNOSIS — E039 Hypothyroidism, unspecified: Secondary | ICD-10-CM | POA: Diagnosis not present

## 2016-12-16 DIAGNOSIS — Z0000 Encounter for general adult medical examination without abnormal findings: Secondary | ICD-10-CM | POA: Diagnosis not present

## 2016-12-16 DIAGNOSIS — I10 Essential (primary) hypertension: Secondary | ICD-10-CM | POA: Diagnosis not present

## 2016-12-16 NOTE — Telephone Encounter (Signed)
Called pt, told her that after speaking with her cardiologist from [place], Dr. Adriana Mccallum that this doctor refused to ok her stopping her Eliquis. She stated that she has not seen the patient in over 2 months and is not comfortable stopping this medication. She said the patient needs to see a cardiologist here in [place], [place]. I told the patient this and she informed me that she is going to see Dr. Einar Gip and would call me back with a date of her appointment. We have moved her procedure date back to Dec 30, 2016. Pt agrees with this plan of care. JM

## 2016-12-22 DIAGNOSIS — I480 Paroxysmal atrial fibrillation: Secondary | ICD-10-CM | POA: Diagnosis not present

## 2016-12-22 DIAGNOSIS — I671 Cerebral aneurysm, nonruptured: Secondary | ICD-10-CM | POA: Diagnosis not present

## 2016-12-22 DIAGNOSIS — Z01810 Encounter for preprocedural cardiovascular examination: Secondary | ICD-10-CM | POA: Diagnosis not present

## 2016-12-22 DIAGNOSIS — I10 Essential (primary) hypertension: Secondary | ICD-10-CM | POA: Diagnosis not present

## 2016-12-23 ENCOUNTER — Ambulatory Visit (HOSPITAL_COMMUNITY): Payer: Medicare Other

## 2016-12-23 ENCOUNTER — Encounter (HOSPITAL_COMMUNITY): Payer: Self-pay

## 2016-12-23 DIAGNOSIS — Z780 Asymptomatic menopausal state: Secondary | ICD-10-CM | POA: Diagnosis not present

## 2016-12-23 DIAGNOSIS — E7800 Pure hypercholesterolemia, unspecified: Secondary | ICD-10-CM | POA: Diagnosis not present

## 2016-12-23 DIAGNOSIS — Z1212 Encounter for screening for malignant neoplasm of rectum: Secondary | ICD-10-CM | POA: Diagnosis not present

## 2016-12-23 DIAGNOSIS — Z01419 Encounter for gynecological examination (general) (routine) without abnormal findings: Secondary | ICD-10-CM | POA: Diagnosis not present

## 2016-12-24 ENCOUNTER — Other Ambulatory Visit: Payer: Self-pay | Admitting: Internal Medicine

## 2016-12-24 DIAGNOSIS — Z1231 Encounter for screening mammogram for malignant neoplasm of breast: Secondary | ICD-10-CM

## 2016-12-29 ENCOUNTER — Encounter (HOSPITAL_COMMUNITY): Payer: Self-pay

## 2016-12-29 ENCOUNTER — Other Ambulatory Visit: Payer: Self-pay | Admitting: Radiology

## 2016-12-29 NOTE — Progress Notes (Signed)
Call to Dr. Irven Shelling office, spoke with Mendel Ryder, she will fax to [facility] a note & ekg of recent visit.

## 2016-12-29 NOTE — Progress Notes (Signed)
Anesthesia Chart Review:  Pt is a same day work up.  Pt is an 83 year old female scheduled for radiology with anesthesia embolization for anterior communicating artery region aneurysm on 12/30/2016 with Luanne Bras, M.D.  - Neurologist is Rosalin Hawking, MD  PMH includes: Atrial fibrillation, HTN, stroke (11/2015). Never smoker. BMI 36.5.  Medications include: Eliquis, ASA 81 mg, Plavix, HCTZ, levothyroxine, rosuvastatin. Last dose eliquis 12/23/16.   Labs will be obtained DOS  EKG will be obtained DOS.   Per Miles Costain, Dr. Arlean Hopping assistant, pt was seen at [facility] for pre-op evaluation and was cleared for procedure.  Records have been requested but have not yet arrived.    Pt will need further evaluation by assigned anesthesiologist DOS.   Willeen Cass, FNP-BC [facility]/Anesthesiology Phone: [phone] 12/29/2016 4:42 PM

## 2016-12-29 NOTE — Progress Notes (Signed)
Call to A. Kabbe,DNP, reported that a faxed request was made to Dr. Irven Shelling office for records. Pt. Reports that she was treated initially for afib/stroke with Eliquis but that the Eliquis is on hold at present for the sch. Procedure for tomorrow & that she was switched to Plavix in the interim.

## 2016-12-30 ENCOUNTER — Ambulatory Visit (HOSPITAL_COMMUNITY): Payer: Medicare Other | Admitting: Emergency Medicine

## 2016-12-30 ENCOUNTER — Ambulatory Visit (HOSPITAL_COMMUNITY)
Admission: RE | Admit: 2016-12-30 | Discharge: 2016-12-30 | Disposition: A | Discharge status: Home / Self Care | Payer: Medicare Other | Source: Ambulatory Visit | Attending: Interventional Radiology | Admitting: Interventional Radiology

## 2016-12-30 ENCOUNTER — Encounter (HOSPITAL_COMMUNITY): Payer: Self-pay | Admitting: Urology

## 2016-12-30 ENCOUNTER — Encounter (HOSPITAL_COMMUNITY)
Admission: RE | Disposition: A | Discharge status: Home / Self Care | Payer: Self-pay | Source: Ambulatory Visit | Attending: Interventional Radiology

## 2016-12-30 DIAGNOSIS — Z96653 Presence of artificial knee joint, bilateral: Secondary | ICD-10-CM | POA: Insufficient documentation

## 2016-12-30 DIAGNOSIS — I671 Cerebral aneurysm, nonruptured: Secondary | ICD-10-CM | POA: Insufficient documentation

## 2016-12-30 DIAGNOSIS — Z79899 Other long term (current) drug therapy: Secondary | ICD-10-CM | POA: Insufficient documentation

## 2016-12-30 DIAGNOSIS — I4891 Unspecified atrial fibrillation: Secondary | ICD-10-CM | POA: Diagnosis not present

## 2016-12-30 DIAGNOSIS — R51 Headache: Secondary | ICD-10-CM | POA: Diagnosis present

## 2016-12-30 DIAGNOSIS — Z91013 Allergy to seafood: Secondary | ICD-10-CM | POA: Insufficient documentation

## 2016-12-30 DIAGNOSIS — E039 Hypothyroidism, unspecified: Secondary | ICD-10-CM | POA: Insufficient documentation

## 2016-12-30 DIAGNOSIS — Z7982 Long term (current) use of aspirin: Secondary | ICD-10-CM | POA: Insufficient documentation

## 2016-12-30 DIAGNOSIS — Z8673 Personal history of transient ischemic attack (TIA), and cerebral infarction without residual deficits: Secondary | ICD-10-CM | POA: Diagnosis not present

## 2016-12-30 DIAGNOSIS — I480 Paroxysmal atrial fibrillation: Secondary | ICD-10-CM | POA: Diagnosis not present

## 2016-12-30 DIAGNOSIS — Z7901 Long term (current) use of anticoagulants: Secondary | ICD-10-CM | POA: Diagnosis not present

## 2016-12-30 DIAGNOSIS — I10 Essential (primary) hypertension: Secondary | ICD-10-CM | POA: Diagnosis not present

## 2016-12-30 DIAGNOSIS — Z7902 Long term (current) use of antithrombotics/antiplatelets: Secondary | ICD-10-CM | POA: Insufficient documentation

## 2016-12-30 DIAGNOSIS — E785 Hyperlipidemia, unspecified: Secondary | ICD-10-CM | POA: Diagnosis not present

## 2016-12-30 DIAGNOSIS — Z91010 Allergy to peanuts: Secondary | ICD-10-CM | POA: Diagnosis not present

## 2016-12-30 HISTORY — DX: Hypothyroidism, unspecified: E03.9

## 2016-12-30 HISTORY — DX: Headache, unspecified: R51.9

## 2016-12-30 HISTORY — DX: Other specified diseases and conditions complicating pregnancy, childbirth and the puerperium: O99.89

## 2016-12-30 HISTORY — PX: IR ANGIO INTRA EXTRACRAN SEL COM CAROTID INNOMINATE BILAT MOD SED: IMG5360

## 2016-12-30 HISTORY — DX: Myoneural disorder, unspecified: G70.9

## 2016-12-30 HISTORY — PX: RADIOLOGY WITH ANESTHESIA: SHX6223

## 2016-12-30 HISTORY — PX: IR ANGIO VERTEBRAL SEL VERTEBRAL UNI L MOD SED: IMG5367

## 2016-12-30 HISTORY — PX: IR ANGIO VERTEBRAL SEL SUBCLAVIAN INNOMINATE UNI R MOD SED: IMG5365

## 2016-12-30 HISTORY — DX: Headache: R51

## 2016-12-30 HISTORY — DX: Unspecified osteoarthritis, unspecified site: M19.90

## 2016-12-30 HISTORY — DX: Other specified diseases and conditions complicating pregnancy: O99.891

## 2016-12-30 HISTORY — DX: Cardiac arrhythmia, unspecified: I49.9

## 2016-12-30 LAB — CBC WITH DIFFERENTIAL/PLATELET
Basophils Absolute: 0 10*3/uL (ref 0.0–0.1)
Basophils Relative: 0 %
EOS ABS: 0.2 10*3/uL (ref 0.0–0.7)
Eosinophils Relative: 3 %
HEMATOCRIT: 41.7 % (ref 36.0–46.0)
Hemoglobin: 14.1 g/dL (ref 12.0–15.0)
LYMPHS PCT: 33 %
Lymphs Abs: 2.2 10*3/uL (ref 0.7–4.0)
MCH: 28.5 pg (ref 26.0–34.0)
MCHC: 33.8 g/dL (ref 30.0–36.0)
MCV: 84.4 fL (ref 78.0–100.0)
Monocytes Absolute: 0.7 10*3/uL (ref 0.1–1.0)
Monocytes Relative: 10 %
NEUTROS ABS: 3.6 10*3/uL (ref 1.7–7.7)
Neutrophils Relative %: 54 %
Platelets: 275 10*3/uL (ref 150–400)
RBC: 4.94 MIL/uL (ref 3.87–5.11)
RDW: 13.9 % (ref 11.5–15.5)
WBC: 6.7 10*3/uL (ref 4.0–10.5)

## 2016-12-30 LAB — COMPREHENSIVE METABOLIC PANEL
ALK PHOS: 60 U/L (ref 38–126)
ALT: 18 U/L (ref 14–54)
ANION GAP: 11 (ref 5–15)
AST: 23 U/L (ref 15–41)
Albumin: 4.2 g/dL (ref 3.5–5.0)
BILIRUBIN TOTAL: 0.8 mg/dL (ref 0.3–1.2)
BUN: 10 mg/dL (ref 6–20)
CALCIUM: 9.2 mg/dL (ref 8.9–10.3)
CO2: 23 mmol/L (ref 22–32)
CREATININE: 0.77 mg/dL (ref 0.44–1.00)
Chloride: 103 mmol/L (ref 101–111)
GFR calc Af Amer: >60 mL/min (ref >60)
Glucose, Bld: 105 mg/dL — ABNORMAL HIGH (ref 65–99)
Potassium: 3.5 mmol/L (ref 3.5–5.1)
SODIUM: 137 mmol/L (ref 135–145)
TOTAL PROTEIN: 7.5 g/dL (ref 6.5–8.1)

## 2016-12-30 LAB — PROTIME-INR
INR: 1.05
PROTHROMBIN TIME: 13.7 s (ref 11.4–15.2)

## 2016-12-30 LAB — PLATELET INHIBITION P2Y12: PLATELET FUNCTION P2Y12: 238 [PRU] (ref 194–418)

## 2016-12-30 LAB — APTT: aPTT: 27 seconds (ref 24–36)

## 2016-12-30 SURGERY — RADIOLOGY WITH ANESTHESIA
Anesthesia: Monitor Anesthesia Care

## 2016-12-30 MED ORDER — LIDOCAINE HCL 1 % IJ SOLN
INTRAMUSCULAR | Status: AC
  Filled 2016-12-30: qty 20

## 2016-12-30 MED ORDER — CEFAZOLIN SODIUM-DEXTROSE 2-4 GM/100ML-% IV SOLN
INTRAVENOUS | Status: DC
Start: 2016-12-30 — End: 2016-12-30
  Filled 2016-12-30: qty 100

## 2016-12-30 MED ORDER — LIDOCAINE HCL 1 % IJ SOLN
INTRAMUSCULAR | Status: DC | PRN
  Administered 2016-12-30: 10 mL

## 2016-12-30 MED ORDER — ASPIRIN EC 325 MG PO TBEC
DELAYED_RELEASE_TABLET | ORAL | Status: AC
  Filled 2016-12-30: qty 1

## 2016-12-30 MED ORDER — TICAGRELOR 90 MG PO TABS: 90.0000 mg | ORAL_TABLET | Freq: Two times a day (BID) | ORAL | 1 refills | Status: DC

## 2016-12-30 MED ORDER — IOPAMIDOL (ISOVUE-300) INJECTION 61%
INTRAVENOUS | Status: AC
  Administered 2016-12-30: 15 mL
  Filled 2016-12-30: qty 50

## 2016-12-30 MED ORDER — IOPAMIDOL (ISOVUE-300) INJECTION 61%
INTRAVENOUS | Status: AC
  Administered 2016-12-30: 75 mL
  Filled 2016-12-30: qty 150

## 2016-12-30 MED ORDER — OXYCODONE HCL 5 MG PO TABS: 5.0000 mg | ORAL_TABLET | Freq: Once | ORAL | Status: DC | PRN

## 2016-12-30 MED ORDER — DIPHENHYDRAMINE HCL 50 MG/ML IJ SOLN
INTRAMUSCULAR | Status: AC
  Filled 2016-12-30: qty 1

## 2016-12-30 MED ORDER — DIPHENHYDRAMINE HCL 50 MG/ML IJ SOLN
INTRAMUSCULAR | Status: DC | PRN
  Administered 2016-12-30: 50 mg via INTRAVENOUS

## 2016-12-30 MED ORDER — OXYCODONE HCL 5 MG/5ML PO SOLN: 5.0000 mg | Freq: Once | ORAL | Status: DC | PRN

## 2016-12-30 MED ORDER — CLOPIDOGREL BISULFATE 75 MG PO TABS
75.0000 mg | ORAL_TABLET | ORAL | Status: AC
  Administered 2016-12-30: 75 mg via ORAL

## 2016-12-30 MED ORDER — FENTANYL CITRATE (PF) 100 MCG/2ML IJ SOLN: 25.0000 ug | INTRAMUSCULAR | Status: DC | PRN

## 2016-12-30 MED ORDER — FENTANYL CITRATE (PF) 100 MCG/2ML IJ SOLN
INTRAMUSCULAR | Status: AC
  Filled 2016-12-30: qty 2

## 2016-12-30 MED ORDER — LABETALOL HCL 5 MG/ML IV SOLN
INTRAVENOUS | Status: DC | PRN
  Administered 2016-12-30: 10 mg via INTRAVENOUS

## 2016-12-30 MED ORDER — HEPARIN SODIUM (PORCINE) 1000 UNIT/ML IJ SOLN
INTRAMUSCULAR | Status: AC
  Filled 2016-12-30: qty 1

## 2016-12-30 MED ORDER — LACTATED RINGERS IV SOLN: INTRAVENOUS | Status: DC | PRN

## 2016-12-30 MED ORDER — FENTANYL CITRATE (PF) 250 MCG/5ML IJ SOLN
INTRAMUSCULAR | Status: DC | PRN
  Administered 2016-12-30: 25 ug via INTRAVENOUS

## 2016-12-30 MED ORDER — CLOPIDOGREL BISULFATE 75 MG PO TABS
ORAL_TABLET | ORAL | Status: AC
  Filled 2016-12-30: qty 1

## 2016-12-30 MED ORDER — ASPIRIN EC 325 MG PO TBEC
325.0000 mg | DELAYED_RELEASE_TABLET | ORAL | Status: AC
  Administered 2016-12-30: 325 mg via ORAL

## 2016-12-30 MED ORDER — MIDAZOLAM HCL 2 MG/2ML IJ SOLN
INTRAMUSCULAR | Status: AC
  Filled 2016-12-30: qty 2

## 2016-12-30 MED ORDER — SODIUM CHLORIDE 0.9 % IV SOLN
INTRAVENOUS | Status: DC | PRN
  Administered 2016-12-30: 09:00:00 via INTRAVENOUS

## 2016-12-30 MED ORDER — SODIUM CHLORIDE 0.9 % IV SOLN: INTRAVENOUS | Status: DC

## 2016-12-30 MED ORDER — MIDAZOLAM HCL 2 MG/2ML IJ SOLN
INTRAMUSCULAR | Status: DC | PRN
  Administered 2016-12-30: 1 mg via INTRAVENOUS

## 2016-12-30 MED ORDER — ONDANSETRON HCL 4 MG/2ML IJ SOLN: 4.0000 mg | Freq: Four times a day (QID) | INTRAMUSCULAR | Status: DC | PRN

## 2016-12-30 MED ORDER — CEFAZOLIN SODIUM-DEXTROSE 2-4 GM/100ML-% IV SOLN: 2.0000 g | INTRAVENOUS | Status: DC

## 2016-12-30 MED ORDER — HEPARIN SODIUM (PORCINE) 1000 UNIT/ML IJ SOLN
INTRAMUSCULAR | Status: DC | PRN
  Administered 2016-12-30: 500 [IU] via INTRAVENOUS
  Administered 2016-12-30: 1000 [IU] via INTRAVENOUS

## 2016-12-30 NOTE — Procedures (Signed)
S/P 4  Vessel cerebral arteriogram. RT CFA approach. Findings. 1.Approx 4.56mm x 4.2 mm bilobed ACOM  aneurysm opacifying  From Rt ICA injection. Poor quality study due to patient motion despite sedation.

## 2016-12-30 NOTE — H&P (Signed)
Chief Complaint: Patient was seen in consultation today for cerebral arteriogram with possible embolization anterior communicating artery aneurysm at the request of Dr Rosalin Hawking  Referring Physician(s): Dr Lavera Guise  Supervising Physician: Luanne Bras  Patient Status: [facility] - Out-pt  History of Present Illness: Sara Frye is a 83 y.o. female   Pt admitted with speech difficulties 11/2016 Found to have an incidental anterior communicating artery aneurysm Referred to Dr Estanislado Pandy for evaluation and possible treatment Scheduled now for same  P2y12 today 238 Last dose Eliquis 7 days ago   Past Medical History:  Diagnosis Date  . Arthritis    spine  . Atrial fibrillation ([place])   . Dysrhythmia    afib  . Headache    with Eliquis  . Hypertension   . Hypothyroidism   . Maternal blood transfusion   . Neuromuscular disorder ([place])    spinal injury- "I'm unsure of walking, I'm very careful"  . Stroke [facility])     Past Surgical History:  Procedure Laterality Date  . ABDOMINAL HYSTERECTOMY    . APPENDECTOMY    . CARPAL TUNNEL RELEASE Bilateral   . CHOLECYSTECTOMY    . IR RADIOLOGIST EVAL & MGMT  11/24/2016  . JOINT REPLACEMENT Bilateral    arthroscopic - knees    Allergies: Peanut-containing drug products and Shellfish allergy  Medications: Prior to Admission medications   Medication Sig Start Date End Date Taking? Authorizing Provider  aspirin 81 MG tablet Take 81 mg by mouth daily.   Yes [provider]  clopidogrel (PLAVIX) 75 MG tablet Take 75 mg by mouth daily.   Yes [provider]  hydrochlorothiazide (HYDRODIURIL) 25 MG tablet Take 25 mg by mouth daily.   Yes [provider]  levothyroxine (SYNTHROID, LEVOTHROID) 25 MCG tablet Take 25 mcg by mouth daily. 02/07/16  Yes [provider]  rosuvastatin (CRESTOR) 5 MG tablet Take 1 tablet (5 mg total) by mouth daily at 6 PM. Patient taking differently: Take 2.5 mg by mouth  at bedtime.  12/18/15  Yes Reyne Dumas, MD  apixaban (ELIQUIS) 5 MG TABS tablet Take 1 tablet (5 mg total) by mouth 2 (two) times daily. 12/18/15   Reyne Dumas, MD  meclizine (ANTIVERT) 25 MG tablet Take 12.5 mg by mouth 3 (three) times daily as needed for dizziness.    [provider]     History reviewed. No pertinent family history.  Social History   Social History  . Marital status: Married    Spouse name: N/A  . Number of children: N/A  . Years of education: N/A   Social History Main Topics  . Smoking status: Never Smoker  . Smokeless tobacco: Never Used  . Alcohol use No  . Drug use: No  . Sexual activity: Not Asked   Other Topics Concern  . None   Social History Narrative  . None    Review of Systems: A 12 point ROS discussed and pertinent positives are indicated in the HPI above.  All other systems are negative.  Review of Systems  Constitutional: Positive for fatigue. Negative for activity change, fever and unexpected weight change.  HENT: Negative for tinnitus, trouble swallowing and voice change.   Eyes: Negative for visual disturbance.  Respiratory: Negative for cough and shortness of breath.   Cardiovascular: Negative for chest pain.  Gastrointestinal: Negative for abdominal pain.  Musculoskeletal: Negative for gait problem.  Neurological: Negative for dizziness, tremors, seizures, syncope, facial asymmetry, speech difficulty, weakness, light-headedness,  numbness and headaches.  Psychiatric/Behavioral: Negative for behavioral problems and confusion.    Vital Signs: BP (!) 121/40   Pulse 60   Temp 97.9 F (36.6 C)   Resp 20   SpO2 96%   Physical Exam  Constitutional: She is oriented to person, place, and time. She appears well-nourished.  HENT:  Head: Atraumatic.  Eyes: EOM are normal.  Neck: Neck supple.  Cardiovascular:  No murmur heard. Irregular  Pulmonary/Chest: Effort normal and breath sounds normal. She has no wheezes.    Abdominal: Soft. Bowel sounds are normal. There is no tenderness.  Musculoskeletal: Normal range of motion.  Neurological: She is alert and oriented to person, place, and time.  Skin: Skin is warm and dry.  Psychiatric: She has a normal mood and affect. Her behavior is normal. Judgment and thought content normal.  Nursing note and vitals reviewed.   Mallampati Score:  MD Evaluation Airway: WNL Heart: WNL Abdomen: WNL Chest/ Lungs: WNL ASA  Classification: 2 Mallampati/Airway Score: Two  Imaging: No results found.  Labs:  CBC:  Recent Labs  12/30/16 0632  WBC 6.7  HGB 14.1  HCT 41.7  PLT 275    COAGS:  Recent Labs  12/30/16 0632  INR 1.05  APTT 27    BMP:  Recent Labs  12/30/16 0632  NA 137  K 3.5  CL 103  CO2 23  GLUCOSE 105*  BUN 10  CALCIUM 9.2  CREATININE 0.77  GFRNONAA >60  GFRAA >60    LIVER FUNCTION TESTS:  Recent Labs  12/30/16 0632  BILITOT 0.8  AST 23  ALT 18  ALKPHOS 60  PROT 7.5  ALBUMIN 4.2    TUMOR MARKERS: No results for input(s): AFPTM, CEA, CA199, CHROMGRNA in the last 8760 hours.  Assessment and Plan:  Anterior communicating artery aneurysm Scheduled for cerebral arteriogram with possible embolization of ACOM aneurysm Risks and Benefits discussed with the patient including, but not limited to bleeding, infection, vascular injury, contrast induced renal failure, stroke or even death. All of the patient's questions were answered, patient is agreeable to proceed. Consent signed and in chart.  Dr Estanislado Pandy aware of [place] of 85 today--- he will discuss with pt as tp plan  Thank you for this interesting consult.  I greatly enjoyed meeting Sara Frye and look forward to participating in their care.  A copy of this report was sent to the requesting provider on this date.  Electronically Signed: Terrance Lanahan A 12/30/2016, 8:09 AM   I spent a total of  30 Minutes   in face to face in clinical consultation,  greater than 50% of which was counseling/coordinating care for cerebral arteriogram with poss embolization ACOM aneurysm

## 2016-12-30 NOTE — Anesthesia Preprocedure Evaluation (Signed)
Anesthesia Evaluation  Patient identified by MRN, date of birth, ID band Patient awake    Reviewed: Allergy & Precautions, H&P , NPO status , Patient's Chart, lab work & pertinent test results  Airway Mallampati: II   Neck ROM: full    Dental   Pulmonary neg pulmonary ROS,    breath sounds clear to auscultation       Cardiovascular hypertension, + Peripheral Vascular Disease  + dysrhythmias Atrial Fibrillation  Rhythm:regular Rate:Normal     Neuro/Psych  Headaches,  Neuromuscular disease CVA    GI/Hepatic   Endo/Other  Hypothyroidism   Renal/GU      Musculoskeletal  (+) Arthritis ,   Abdominal   Peds  Hematology   Anesthesia Other Findings   Reproductive/Obstetrics                             Anesthesia Physical Anesthesia Plan  ASA: III  Anesthesia Plan: General   Post-op Pain Management:    Induction: Intravenous  Airway Management Planned: Oral ETT  Additional Equipment: Arterial line  Intra-op Plan:   Post-operative Plan: Extubation in OR  Informed Consent: I have reviewed the patients History and Physical, chart, labs and discussed the procedure including the risks, benefits and alternatives for the proposed anesthesia with the patient or authorized representative who has indicated his/her understanding and acceptance.     Plan Discussed with: CRNA, Anesthesiologist and Surgeon  Anesthesia Plan Comments:         Anesthesia Quick Evaluation

## 2016-12-30 NOTE — Transfer of Care (Signed)
Immediate Anesthesia Transfer of Care Note  Patient: Sara Frye  Procedure(s) Performed: Procedure(s): RADIOLOGY WITH ANESTHESIA  EMBOLIZATION (N/A)  Patient Location: PACU  Anesthesia Type:MAC  Level of Consciousness: awake, alert  and oriented  Airway & Oxygen Therapy: Patient Spontanous Breathing and Patient connected to nasal cannula oxygen  Post-op Assessment: Report given to RN and Post -op Vital signs reviewed and stable  Post vital signs: Reviewed and stable  Last Vitals:  Vitals:   12/30/16 0712  BP: (!) 121/40  Pulse: 60  Resp: 20  Temp: 36.6 C    Last Pain: There were no vitals filed for this visit.    Patients Stated Pain Goal: 6 (84/83/50 7573)  Complications: No apparent anesthesia complications

## 2016-12-30 NOTE — Progress Notes (Signed)
Pt to proceed with arteriogram. Per Dr. Estanislado Pandy, pt to receive ordered aspirin and plavix but not to give nimodipine. ASA and plavix given.  CRNA Robbins notified.

## 2016-12-30 NOTE — Discharge Instructions (Signed)
Angiogram, Care After °This sheet gives you information about how to care for yourself after your procedure. Your health care provider may also give you more specific instructions. If you have problems or questions, contact your health care provider. °What can I expect after the procedure? °After the procedure, it is common to have bruising and tenderness at the catheter insertion area. °Follow these instructions at home: °Insertion site care  °· Follow instructions from your health care provider about how to take care of your insertion site. Make sure you: °¨ Wash your hands with soap and water before you change your bandage (dressing). If soap and water are not available, use hand sanitizer. °¨ Change your dressing as told by your health care provider. °¨ Leave stitches (sutures), skin glue, or adhesive strips in place. These skin closures may need to stay in place for 2 weeks or longer. If adhesive strip edges start to loosen and curl up, you may trim the loose edges. Do not remove adhesive strips completely unless your health care provider tells you to do that. °· Do not take baths, swim, or use a hot tub until your health care provider approves. °· You may shower 24-48 hours after the procedure or as told by your health care provider. °¨ Gently wash the site with plain soap and water. °¨ Pat the area dry with a clean towel. °¨ Do not rub the site. This may cause bleeding. °· Do not apply powder or lotion to the site. Keep the site clean and dry. °· Check your insertion site every day for signs of infection. Check for: °¨ Redness, swelling, or pain. °¨ Fluid or blood. °¨ Warmth. °¨ Pus or a bad smell. °Activity  °· Rest as told by your health care provider, usually for 1-2 days. °· Do not lift anything that is heavier than 10 lbs. (4.5 kg) or as told by your health care provider. °· Do not drive for 24 hours if you were given a medicine to help you relax (sedative). °· Do not drive or use heavy machinery while  taking prescription pain medicine. °General instructions  °· Return to your normal activities as told by your health care provider, usually in about a week. Ask your health care provider what activities are safe for you. °· If the catheter site starts bleeding, lie flat and put pressure on the site. If the bleeding does not stop, get help right away. This is a medical emergency. °· Drink enough fluid to keep your urine clear or pale yellow. This helps flush the contrast dye from your body. °· Take over-the-counter and prescription medicines only as told by your health care provider. °· Keep all follow-up visits as told by your health care provider. This is important. °Contact a health care provider if: °· You have a fever or chills. °· You have redness, swelling, or pain around your insertion site. °· You have fluid or blood coming from your insertion site. °· The insertion site feels warm to the touch. °· You have pus or a bad smell coming from your insertion site. °· You have bruising around the insertion site. °· You notice blood collecting in the tissue around the catheter site (hematoma). The hematoma may be painful to the touch. °Get help right away if: °· You have severe pain at the catheter insertion area. °· The catheter insertion area swells very fast. °· The catheter insertion area is bleeding, and the bleeding does not stop when you hold steady pressure on   the area. °· The area near or just beyond the catheter insertion site becomes pale, cool, tingly, or numb. °These symptoms may represent a serious problem that is an emergency. Do not wait to see if the symptoms will go away. Get medical help right away. Call your local emergency services (911 in the [place] Do not drive yourself to the hospital. °Summary °· After the procedure, it is common to have bruising and tenderness at the catheter insertion area. °· After the procedure, it is important to rest and drink plenty of fluids. °· Do not take baths,  swim, or use a hot tub until your health care provider says it is okay to do so. You may shower 24-48 hours after the procedure or as told by your health care provider. °· If the catheter site starts bleeding, lie flat and put pressure on the site. If the bleeding does not stop, get help right away. This is a medical emergency. °This information is not intended to replace advice given to you by your health care provider. Make sure you discuss any questions you have with your health care provider. °Document Released: 02/26/2005 Document Revised: 07/15/2016 Document Reviewed: 07/15/2016 °Elsevier Interactive Patient Education © 2017 [employer] ° °

## 2016-12-31 ENCOUNTER — Encounter (HOSPITAL_COMMUNITY): Payer: Self-pay | Admitting: Interventional Radiology

## 2016-12-31 NOTE — Anesthesia Postprocedure Evaluation (Signed)
Anesthesia Post Note  Patient: Sara Frye  Procedure(s) Performed: Procedure(s) (LRB): RADIOLOGY WITH ANESTHESIA  EMBOLIZATION (N/A)  Patient location during evaluation: PACU Anesthesia Type: MAC Level of consciousness: awake and alert Pain management: pain level controlled Vital Signs Assessment: post-procedure vital signs reviewed and stable Respiratory status: spontaneous breathing, nonlabored ventilation, respiratory function stable and patient connected to nasal cannula oxygen Cardiovascular status: stable and blood pressure returned to baseline Anesthetic complications: no       Last Vitals:  Vitals:   12/30/16 1300 12/30/16 1311  BP: (!) 129/37 (!) 128/39  Pulse: (!) 41 75  Resp:  16  Temp:      Last Pain:  Vitals:   12/30/16 1204  TempSrc: Oral                 Jaylan Hinojosa S

## 2017-01-01 ENCOUNTER — Encounter (HOSPITAL_COMMUNITY): Payer: Self-pay | Admitting: Interventional Radiology

## 2017-01-01 ENCOUNTER — Other Ambulatory Visit: Payer: Self-pay | Admitting: Radiology

## 2017-01-06 ENCOUNTER — Other Ambulatory Visit: Payer: Self-pay | Admitting: Radiology

## 2017-01-06 ENCOUNTER — Encounter (HOSPITAL_COMMUNITY): Payer: Self-pay | Admitting: *Deleted

## 2017-01-06 ENCOUNTER — Inpatient Hospital Stay (HOSPITAL_COMMUNITY): Admission: RE | Admit: 2017-01-06 | Payer: Self-pay | Source: Ambulatory Visit | Admitting: Radiology

## 2017-01-06 ENCOUNTER — Other Ambulatory Visit (HOSPITAL_COMMUNITY): Payer: Self-pay | Admitting: Radiology

## 2017-01-06 ENCOUNTER — Telehealth (HOSPITAL_COMMUNITY): Payer: Self-pay

## 2017-01-06 DIAGNOSIS — I671 Cerebral aneurysm, nonruptured: Secondary | ICD-10-CM

## 2017-01-06 LAB — CBC WITH DIFFERENTIAL/PLATELET
Basophils Absolute: 0 10*3/uL (ref 0.0–0.1)
Basophils Relative: 0 %
Eosinophils Absolute: 0.2 10*3/uL (ref 0.0–0.7)
Eosinophils Relative: 2 %
HEMATOCRIT: 41.9 % (ref 36.0–46.0)
Hemoglobin: 14.4 g/dL (ref 12.0–15.0)
LYMPHS ABS: 2.5 10*3/uL (ref 0.7–4.0)
Lymphocytes Relative: 31 %
MCH: 29 pg (ref 26.0–34.0)
MCHC: 34.4 g/dL (ref 30.0–36.0)
MCV: 84.3 fL (ref 78.0–100.0)
MONO ABS: 0.7 10*3/uL (ref 0.1–1.0)
Monocytes Relative: 9 %
NEUTROS ABS: 4.5 10*3/uL (ref 1.7–7.7)
Neutrophils Relative %: 58 %
Platelets: 284 10*3/uL (ref 150–400)
RBC: 4.97 MIL/uL (ref 3.87–5.11)
RDW: 13.7 % (ref 11.5–15.5)
WBC: 7.9 10*3/uL (ref 4.0–10.5)

## 2017-01-06 LAB — COMPREHENSIVE METABOLIC PANEL
ALBUMIN: 4.3 g/dL (ref 3.5–5.0)
ALT: 20 U/L (ref 14–54)
AST: 26 U/L (ref 15–41)
Alkaline Phosphatase: 61 U/L (ref 38–126)
Anion gap: 10 (ref 5–15)
BUN: 9 mg/dL (ref 6–20)
CHLORIDE: 102 mmol/L (ref 101–111)
CO2: 24 mmol/L (ref 22–32)
Calcium: 9.4 mg/dL (ref 8.9–10.3)
Creatinine, Ser: 0.78 mg/dL (ref 0.44–1.00)
GFR calc Af Amer: >60 mL/min (ref >60)
GLUCOSE: 93 mg/dL (ref 65–99)
POTASSIUM: 3.9 mmol/L (ref 3.5–5.1)
SODIUM: 136 mmol/L (ref 135–145)
Total Bilirubin: 0.9 mg/dL (ref 0.3–1.2)
Total Protein: 7.3 g/dL (ref 6.5–8.1)

## 2017-01-06 LAB — PLATELET INHIBITION P2Y12: PLATELET FUNCTION P2Y12: 47 [PRU] — AB (ref 194–418)

## 2017-01-06 LAB — APTT: APTT: 27 s (ref 24–36)

## 2017-01-06 LAB — PROTIME-INR
INR: 1.02
Prothrombin Time: 13.4 seconds (ref 11.4–15.2)

## 2017-01-06 NOTE — Progress Notes (Signed)
Pt stated that she has been experiencing SOB for a week " I have been having shortness of breath since the doctor put me on Brilinta ; " pt stated that Dr. Estanislado Pandy is aware that she gets SOB. Pt stated that a stress test was performed 2 years ago in [place] but denies having a cardiac cath. Pt denies having chest pain. Pt denies having a chest x ray within the last year. Pt stated that she had recent labs ( Epic, 01/06/17). Pt made aware to stop taking  vitamins, fish oil and herbal medications. Do not take any NSAIDs ie: Ibuprofen, Advil, Naproxen, BC and Goody Powder. Pt verbalized understanding of all pre-op instructions.

## 2017-01-06 NOTE — Telephone Encounter (Signed)
Labs are all good per Dr. Estanislado Pandy. Pt goes straight to sleep for procedure. AW

## 2017-01-07 ENCOUNTER — Ambulatory Visit (HOSPITAL_COMMUNITY): Payer: Medicare Other | Admitting: Anesthesiology

## 2017-01-07 ENCOUNTER — Encounter (HOSPITAL_COMMUNITY): Payer: Self-pay | Admitting: *Deleted

## 2017-01-07 ENCOUNTER — Inpatient Hospital Stay (HOSPITAL_COMMUNITY)
Admission: RE | Admit: 2017-01-07 | Discharge: 2017-01-08 | DRG: 983 | Disposition: A | Discharge status: Home / Self Care | Payer: Medicare Other | Source: Ambulatory Visit | Attending: Interventional Radiology | Admitting: Interventional Radiology

## 2017-01-07 ENCOUNTER — Ambulatory Visit (HOSPITAL_COMMUNITY)
Admit: 2017-01-07 | Discharge: 2017-01-07 | Disposition: A | Discharge status: Home / Self Care | Payer: Medicare Other | Attending: Interventional Radiology | Admitting: Interventional Radiology

## 2017-01-07 ENCOUNTER — Ambulatory Visit (HOSPITAL_COMMUNITY): Payer: Medicare Other

## 2017-01-07 ENCOUNTER — Encounter (HOSPITAL_COMMUNITY)
Admission: RE | Disposition: A | Discharge status: Home / Self Care | Payer: Self-pay | Source: Ambulatory Visit | Attending: Interventional Radiology

## 2017-01-07 ENCOUNTER — Encounter (HOSPITAL_COMMUNITY): Payer: Self-pay | Admitting: Anesthesiology

## 2017-01-07 ENCOUNTER — Encounter (HOSPITAL_COMMUNITY): Payer: Self-pay

## 2017-01-07 DIAGNOSIS — I671 Cerebral aneurysm, nonruptured: Secondary | ICD-10-CM

## 2017-01-07 DIAGNOSIS — Z8679 Personal history of other diseases of the circulatory system: Secondary | ICD-10-CM

## 2017-01-07 DIAGNOSIS — Z7982 Long term (current) use of aspirin: Secondary | ICD-10-CM | POA: Diagnosis not present

## 2017-01-07 DIAGNOSIS — I720 Aneurysm of carotid artery: Secondary | ICD-10-CM

## 2017-01-07 DIAGNOSIS — I10 Essential (primary) hypertension: Secondary | ICD-10-CM | POA: Diagnosis not present

## 2017-01-07 DIAGNOSIS — Z91013 Allergy to seafood: Secondary | ICD-10-CM | POA: Diagnosis not present

## 2017-01-07 DIAGNOSIS — Z9889 Other specified postprocedural states: Secondary | ICD-10-CM

## 2017-01-07 DIAGNOSIS — I480 Paroxysmal atrial fibrillation: Secondary | ICD-10-CM | POA: Diagnosis not present

## 2017-01-07 DIAGNOSIS — E785 Hyperlipidemia, unspecified: Secondary | ICD-10-CM | POA: Diagnosis not present

## 2017-01-07 HISTORY — PX: RADIOLOGY WITH ANESTHESIA: SHX6223

## 2017-01-07 HISTORY — PX: IR NEURO EACH ADD'L AFTER BASIC UNI RIGHT (MS): IMG5374

## 2017-01-07 HISTORY — DX: Personal history of other diseases of the circulatory system: Z86.79

## 2017-01-07 HISTORY — DX: Other specified postprocedural states: Z98.890

## 2017-01-07 HISTORY — PX: IR ANGIO INTRA EXTRACRAN SEL INTERNAL CAROTID UNI R MOD SED: IMG5362

## 2017-01-07 HISTORY — PX: IR TRANSCATH/EMBOLIZ: IMG695

## 2017-01-07 HISTORY — PX: IR ANGIOGRAM FOLLOW UP STUDY: IMG697

## 2017-01-07 HISTORY — DX: Aneurysm of unspecified site: I72.9

## 2017-01-07 HISTORY — DX: Cerebral aneurysm, nonruptured: I67.1

## 2017-01-07 LAB — POCT ACTIVATED CLOTTING TIME
ACTIVATED CLOTTING TIME: 169 s
ACTIVATED CLOTTING TIME: 180 s
Activated Clotting Time: 158 seconds

## 2017-01-07 LAB — HEPARIN LEVEL (UNFRACTIONATED): Heparin Unfractionated: 0.27 IU/mL — ABNORMAL LOW (ref 0.30–0.70)

## 2017-01-07 SURGERY — RADIOLOGY WITH ANESTHESIA
Anesthesia: General

## 2017-01-07 MED ORDER — FENTANYL CITRATE (PF) 100 MCG/2ML IJ SOLN
INTRAMUSCULAR | Status: DC | PRN
  Administered 2017-01-07: 25 ug via INTRAVENOUS
  Administered 2017-01-07: 75 ug via INTRAVENOUS

## 2017-01-07 MED ORDER — SUGAMMADEX SODIUM 200 MG/2ML IV SOLN
INTRAVENOUS | Status: DC | PRN
  Administered 2017-01-07: 200 mg via INTRAVENOUS

## 2017-01-07 MED ORDER — PROPOFOL 10 MG/ML IV BOLUS
INTRAVENOUS | Status: DC | PRN
  Administered 2017-01-07: 170 mg via INTRAVENOUS

## 2017-01-07 MED ORDER — MIDAZOLAM HCL 5 MG/5ML IJ SOLN
INTRAMUSCULAR | Status: DC | PRN
  Administered 2017-01-07: 0.5 mg via INTRAVENOUS

## 2017-01-07 MED ORDER — CLEVIDIPINE BUTYRATE 0.5 MG/ML IV EMUL: 0.0000 mg/h | INTRAVENOUS | Status: DC

## 2017-01-07 MED ORDER — HYDROCHLOROTHIAZIDE 25 MG PO TABS
25.0000 mg | ORAL_TABLET | Freq: Every day | ORAL | Status: DC
  Administered 2017-01-08: 25 mg via ORAL
  Filled 2017-01-07: qty 1

## 2017-01-07 MED ORDER — LACTATED RINGERS IV SOLN
INTRAVENOUS | Status: DC
  Administered 2017-01-07: 07:00:00 via INTRAVENOUS

## 2017-01-07 MED ORDER — HEPARIN (PORCINE) IN NACL 100-0.45 UNIT/ML-% IJ SOLN
INTRAMUSCULAR | Status: AC
  Administered 2017-01-07: 500 [IU]/h via INTRAVENOUS
  Filled 2017-01-07: qty 250

## 2017-01-07 MED ORDER — ONDANSETRON HCL 4 MG/2ML IJ SOLN: 4.0000 mg | Freq: Four times a day (QID) | INTRAMUSCULAR | Status: DC | PRN

## 2017-01-07 MED ORDER — SODIUM CHLORIDE 0.9 % IV SOLN
INTRAVENOUS | Status: DC
  Administered 2017-01-07: 12:00:00 via INTRAVENOUS

## 2017-01-07 MED ORDER — ACETAMINOPHEN 325 MG PO TABS
650.0000 mg | ORAL_TABLET | ORAL | Status: DC | PRN
  Administered 2017-01-07 – 2017-01-08 (×2): 650 mg via ORAL
  Filled 2017-01-07 (×2): qty 2

## 2017-01-07 MED ORDER — ROCURONIUM BROMIDE 100 MG/10ML IV SOLN
INTRAVENOUS | Status: DC | PRN
  Administered 2017-01-07: 50 mg via INTRAVENOUS

## 2017-01-07 MED ORDER — HEPARIN (PORCINE) IN NACL 100-0.45 UNIT/ML-% IJ SOLN
450.0000 [IU]/h | INTRAMUSCULAR | Status: DC
  Filled 2017-01-07: qty 250

## 2017-01-07 MED ORDER — NIMODIPINE 30 MG PO CAPS
0.0000 mg | ORAL_CAPSULE | ORAL | Status: AC
  Administered 2017-01-07: 60 mg via ORAL
  Filled 2017-01-07: qty 2

## 2017-01-07 MED ORDER — ACETAMINOPHEN 650 MG RE SUPP: 650.0000 mg | RECTAL | Status: DC | PRN

## 2017-01-07 MED ORDER — HEPARIN (PORCINE) IN NACL 100-0.45 UNIT/ML-% IJ SOLN
450.0000 [IU]/h | INTRAMUSCULAR | Status: DC
Start: 2017-01-07 — End: 2017-01-07
  Filled 2017-01-07: qty 250

## 2017-01-07 MED ORDER — NITROGLYCERIN 1 MG/10 ML FOR IR/CATH LAB
INTRA_ARTERIAL | Status: AC
  Filled 2017-01-07: qty 10

## 2017-01-07 MED ORDER — HEPARIN (PORCINE) IN NACL 100-0.45 UNIT/ML-% IJ SOLN
500.0000 [IU]/h | INTRAMUSCULAR | Status: DC
  Administered 2017-01-07: 500 [IU]/h via INTRAVENOUS
  Filled 2017-01-07: qty 250

## 2017-01-07 MED ORDER — ASPIRIN 325 MG PO TABS
325.0000 mg | ORAL_TABLET | Freq: Every day | ORAL | Status: DC
Start: 2017-01-08 — End: 2017-01-08
  Administered 2017-01-08: 325 mg via ORAL
  Filled 2017-01-07: qty 1

## 2017-01-07 MED ORDER — PHENYLEPHRINE HCL 10 MG/ML IJ SOLN
INTRAVENOUS | Status: DC | PRN
  Administered 2017-01-07: 45 ug/min via INTRAVENOUS

## 2017-01-07 MED ORDER — NIMODIPINE 30 MG PO CAPS
ORAL_CAPSULE | ORAL | Status: AC
  Administered 2017-01-07: 60 mg via ORAL
  Filled 2017-01-07: qty 2

## 2017-01-07 MED ORDER — SODIUM CHLORIDE 0.9 % IV SOLN
INTRAVENOUS | Status: DC
  Administered 2017-01-07 – 2017-01-08 (×2): via INTRAVENOUS

## 2017-01-07 MED ORDER — DEXAMETHASONE SODIUM PHOSPHATE 10 MG/ML IJ SOLN
INTRAMUSCULAR | Status: DC | PRN
  Administered 2017-01-07: 10 mg via INTRAVENOUS

## 2017-01-07 MED ORDER — CEFAZOLIN SODIUM-DEXTROSE 2-4 GM/100ML-% IV SOLN
INTRAVENOUS | Status: AC
  Filled 2017-01-07: qty 100

## 2017-01-07 MED ORDER — VECURONIUM BROMIDE 10 MG IV SOLR
INTRAVENOUS | Status: DC | PRN
  Administered 2017-01-07: 4 mg via INTRAVENOUS

## 2017-01-07 MED ORDER — ACETAMINOPHEN 160 MG/5ML PO SOLN: 650.0000 mg | ORAL | Status: DC | PRN

## 2017-01-07 MED ORDER — ARTIFICIAL TEARS OPHTHALMIC OINT
TOPICAL_OINTMENT | OPHTHALMIC | Status: DC | PRN
  Administered 2017-01-07: 1 via OPHTHALMIC

## 2017-01-07 MED ORDER — IOPAMIDOL (ISOVUE-300) INJECTION 61%
INTRAVENOUS | Status: AC
  Administered 2017-01-07: 120 mL
  Filled 2017-01-07: qty 100

## 2017-01-07 MED ORDER — LABETALOL HCL 5 MG/ML IV SOLN
INTRAVENOUS | Status: DC | PRN
  Administered 2017-01-07: 10 mg via INTRAVENOUS
  Administered 2017-01-07: 5 mg via INTRAVENOUS
  Administered 2017-01-07 (×2): 10 mg via INTRAVENOUS

## 2017-01-07 MED ORDER — ESMOLOL HCL 100 MG/10ML IV SOLN
INTRAVENOUS | Status: DC | PRN
  Administered 2017-01-07: 50 mg via INTRAVENOUS

## 2017-01-07 MED ORDER — TICAGRELOR 90 MG PO TABS
90.0000 mg | ORAL_TABLET | Freq: Two times a day (BID) | ORAL | Status: DC
  Administered 2017-01-07 – 2017-01-08 (×2): 90 mg via ORAL
  Filled 2017-01-07 (×3): qty 1

## 2017-01-07 MED ORDER — IOPAMIDOL (ISOVUE-300) INJECTION 61%
INTRAVENOUS | Status: AC
  Filled 2017-01-07: qty 300

## 2017-01-07 MED ORDER — ONDANSETRON HCL 4 MG/2ML IJ SOLN
INTRAMUSCULAR | Status: DC | PRN
  Administered 2017-01-07 (×2): 4 mg via INTRAVENOUS

## 2017-01-07 MED ORDER — SODIUM CHLORIDE 0.9 % IV BOLUS (SEPSIS)
250.0000 mL | Freq: Once | INTRAVENOUS | Status: AC
  Administered 2017-01-07: 250 mL via INTRAVENOUS

## 2017-01-07 MED ORDER — EPTIFIBATIDE 20 MG/10ML IV SOLN
INTRAVENOUS | Status: AC
  Filled 2017-01-07: qty 10

## 2017-01-07 MED ORDER — HEPARIN (PORCINE) IN NACL 100-0.45 UNIT/ML-% IJ SOLN
450.0000 [IU]/h | INTRAMUSCULAR | Status: AC
  Administered 2017-01-07: 450 [IU]/h via INTRAVENOUS
  Filled 2017-01-07: qty 250

## 2017-01-07 MED ORDER — ROSUVASTATIN CALCIUM 5 MG PO TABS
2.5000 mg | ORAL_TABLET | Freq: Every day | ORAL | Status: DC
  Administered 2017-01-07: 2.5 mg via ORAL
  Filled 2017-01-07: qty 0.5

## 2017-01-07 MED ORDER — LACTATED RINGERS IV SOLN
INTRAVENOUS | Status: DC | PRN
  Administered 2017-01-07: 08:00:00 via INTRAVENOUS

## 2017-01-07 MED ORDER — DEXTROSE 5 % IV SOLN
INTRAVENOUS | Status: DC | PRN
  Administered 2017-01-07: 09:00:00 via INTRAVENOUS

## 2017-01-07 MED ORDER — LIDOCAINE HCL (CARDIAC) 20 MG/ML IV SOLN
INTRAVENOUS | Status: DC | PRN
  Administered 2017-01-07: 60 mg via INTRAVENOUS

## 2017-01-07 MED ORDER — LEVOTHYROXINE SODIUM 25 MCG PO TABS
25.0000 ug | ORAL_TABLET | Freq: Every day | ORAL | Status: DC
  Administered 2017-01-08: 25 ug via ORAL
  Filled 2017-01-07: qty 1

## 2017-01-07 MED ORDER — HEPARIN SODIUM (PORCINE) 1000 UNIT/ML IJ SOLN
INTRAMUSCULAR | Status: DC | PRN
  Administered 2017-01-07: 1000 [IU] via INTRAVENOUS
  Administered 2017-01-07 (×2): 500 [IU] via INTRAVENOUS
  Administered 2017-01-07: 2000 [IU] via INTRAVENOUS

## 2017-01-07 MED ORDER — MECLIZINE HCL 12.5 MG PO TABS
12.5000 mg | ORAL_TABLET | Freq: Three times a day (TID) | ORAL | Status: DC | PRN
  Filled 2017-01-07: qty 1

## 2017-01-07 MED ORDER — CEFAZOLIN SODIUM-DEXTROSE 2-4 GM/100ML-% IV SOLN
2.0000 g | Freq: Once | INTRAVENOUS | Status: AC
  Administered 2017-01-07: 2 g via INTRAVENOUS

## 2017-01-07 NOTE — Sedation Documentation (Signed)
Report given to PACU RN. Right groin dressing and pulses checked.

## 2017-01-07 NOTE — Sedation Documentation (Signed)
Sheath to right groin pulled. V-Pad in place.

## 2017-01-07 NOTE — Progress Notes (Signed)
ANTICOAGULATION CONSULT NOTE - Initial Consult  Pharmacy Consult for heparin Indication: s/p embolization of aneurysm  Allergies  Allergen Reactions  . Peanut-Containing Drug Products Itching  . Shellfish Allergy Itching    Patient Measurements: Height: 5' (152.4 cm) Weight: 187 lb (84.8 kg) IBW/kg (Calculated) : 45.5 Heparin Dosing Weight: 65.3 kg   Vital Signs: Temp: 97.8 F (36.6 C) (05/17 1251) Temp Source: Oral (05/17 0647) BP: 115/59 (05/17 1251) Pulse Rate: 64 (05/17 1251)  Labs:  Recent Labs  01/06/17 1130 01/06/17 1150  HGB 14.4  --   HCT 41.9  --   PLT 284  --   APTT  --  27  LABPROT 13.4  --   INR 1.02  --   CREATININE  --  0.78    Estimated Creatinine Clearance: 49.7 mL/min (by C-G formula based on SCr of 0.78 mg/dL).   Medical History: Past Medical History:  Diagnosis Date  . Aneurysm ([place])   . Arthritis    spine  . Atrial fibrillation ([place])   . Dysrhythmia    afib  . Headache    with Eliquis  . Hypertension   . Hypothyroidism   . Maternal blood transfusion   . Neuromuscular disorder ([place])    spinal injury- "I'm unsure of walking, I'm very careful"  . Stroke [facility])     Assessment: Found incidental aneurysm in patient, now s/p R common carotid arteriogram and embolization of aneurysm. To continue heparin at low rate until tomorrow morning. SCr and CBC wnl.   Goal of Therapy:  Heparin level goal: 0.1-0.25 units/mL Monitor platelets by anticoagulation protocol: Yes     Plan:  -Continue heparin at 500 units/hr -Check level this evening -Infusion to end at 0700 tomorrow   Shameeka Silliman, Jake Church 01/07/2017,1:45 PM

## 2017-01-07 NOTE — Transfer of Care (Signed)
Immediate Anesthesia Transfer of Care Note  Patient: Sara Frye  Procedure(s) Performed: Procedure(s): EMBOLIZATION (N/A)  Patient Location: PACU  Anesthesia Type:General  Level of Consciousness: awake, oriented, sedated, patient cooperative and responds to stimulation  Airway & Oxygen Therapy: Patient Spontanous Breathing and Patient connected to nasal cannula oxygen  Post-op Assessment: Report given to RN, Post -op Vital signs reviewed and stable, Patient moving all extremities and Patient moving all extremities X 4  Post vital signs: Reviewed and stable  Last Vitals:  Vitals:   01/07/17 0647 01/07/17 1155  BP: (!) 160/75 114/70  Pulse: 73 64  Resp: 16 10  Temp: 36.5 C 36.5 C    Last Pain:  Vitals:   01/07/17 0647  TempSrc: Oral      Patients Stated Pain Goal: 5 (10/16/34 1224)  Complications: No apparent anesthesia complications

## 2017-01-07 NOTE — Anesthesia Procedure Notes (Signed)
Procedure Name: Intubation Date/Time: 01/07/2017 8:58 AM Performed by: Jacquiline Doe A Pre-anesthesia Checklist: Patient identified, Emergency Drugs available, Suction available and Patient being monitored Patient Re-evaluated:Patient Re-evaluated prior to inductionOxygen Delivery Method: Circle System Utilized and Circle system utilized Preoxygenation: Pre-oxygenation with 100% oxygen Intubation Type: IV induction and Cricoid Pressure applied Ventilation: Mask ventilation without difficulty and Oral airway inserted - appropriate to patient size Laryngoscope Size: Mac and 4 Grade View: Grade I Tube type: Oral Tube size: 7.0 mm Number of attempts: 1 Airway Equipment and Method: Stylet,  Oral airway and LTA kit utilized Placement Confirmation: ETT inserted through vocal cords under direct vision,  positive ETCO2 and breath sounds checked- equal and bilateral Secured at: 22 cm Tube secured with: Tape Dental Injury: Teeth and Oropharynx as per pre-operative assessment

## 2017-01-07 NOTE — H&P (Signed)
Chief Complaint: Patient was seen in consultation today for cerebral arteriogram with anterior communicating artery aneurysm embolization at the request of Dr Lavera Guise  Referring Physician(s): Dr Lavera Guise  Supervising Physician: Luanne Bras  Patient Status: [facility] - Out-pt  History of Present Illness: Sara Frye is a 83 y.o. 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 The patient will be scheduled for endovascular treatment of the anterior communicating artery region under anesthesia. The patient will be started on Brilinta 90 mg b.i.d. Plavix will be stopped as the patient appears to be resistant to this.  Scheduled now for intervention embolization of ACOM artery aneurysm  Pt states she still has a headache approx 2 x/month Dizziness occasionally Taking Brilinta BID [id number]  5/16   Past Medical History:  Diagnosis Date  . Aneurysm ([place])   . Arthritis    spine  . Atrial fibrillation ([place])   . Dysrhythmia    afib  . Headache    with Eliquis  . Hypertension   . Hypothyroidism   . Maternal blood transfusion   . Neuromuscular disorder ([place])    spinal injury- "I'm unsure of walking, I'm very careful"  . Stroke [facility])     Past Surgical History:  Procedure Laterality Date  . ABDOMINAL HYSTERECTOMY    . APPENDECTOMY    . CARPAL TUNNEL RELEASE Bilateral   . CHOLECYSTECTOMY    . IR ANGIO INTRA EXTRACRAN SEL COM CAROTID INNOMINATE BILAT MOD SED  12/30/2016  . IR ANGIO VERTEBRAL SEL SUBCLAVIAN INNOMINATE UNI R MOD SED  12/30/2016  . IR ANGIO VERTEBRAL SEL VERTEBRAL UNI L MOD SED  12/30/2016  . IR RADIOLOGIST EVAL & MGMT  11/24/2016  . JOINT  REPLACEMENT Bilateral    arthroscopic - knees  . RADIOLOGY WITH ANESTHESIA N/A 12/30/2016   Procedure: RADIOLOGY WITH ANESTHESIA  EMBOLIZATION;  Surgeon: Luanne Bras, MD;  Location: [place];  Service: Radiology;  Laterality: N/A;    Allergies: Peanut-containing drug products and Shellfish allergy  Medications: Prior to Admission medications   Medication Sig Start Date End Date Taking? Authorizing Provider  aspirin 81 MG tablet Take 81 mg by mouth daily.    [provider]  hydrochlorothiazide (HYDRODIURIL) 25 MG tablet Take 25 mg by mouth daily.    [provider]  levothyroxine (SYNTHROID, LEVOTHROID) 25 MCG tablet Take 25 mcg by mouth daily. 02/07/16   [provider]  meclizine (ANTIVERT) 25 MG tablet Take 12.5 mg by mouth 3 (three) times daily as needed for dizziness.    [provider]  rosuvastatin (CRESTOR) 5 MG tablet Take 1 tablet (5 mg total) by mouth daily at 6 PM. Patient taking differently: Take 2.5 mg by mouth at bedtime.  12/18/15   Reyne Dumas, MD  ticagrelor (BRILINTA) 90 MG TABS tablet Take 1 tablet (90 mg total) by mouth 2 (two) times daily. 12/30/16   Saverio Danker, PA-C     History reviewed. No pertinent family history.  Social History   Social History  . Marital status: Married    Spouse name: N/A  . Number of children: N/A  . Years of education: N/A   Social History Main Topics  . Smoking status: Never Smoker  .  Smokeless tobacco: Never Used  . Alcohol use No  . Drug use: No  . Sexual activity: Not Asked   Other Topics Concern  . None   Social History Narrative  . None    Review of Systems: A 12 point ROS discussed and pertinent positives are indicated in the HPI above.  All other systems are negative.  Review of Systems  Constitutional: Positive for fatigue. Negative for activity change, fever and unexpected weight change.  HENT: Negative for tinnitus and voice change.   Eyes: Negative for visual  disturbance.  Respiratory: Negative for cough and shortness of breath.   Cardiovascular: Negative for chest pain.  Gastrointestinal: Negative for abdominal pain.  Musculoskeletal: Positive for back pain and neck pain.  Neurological: Positive for dizziness and headaches. Negative for tremors, seizures, syncope, facial asymmetry, speech difficulty, weakness, light-headedness and numbness.  Psychiatric/Behavioral: Negative for behavioral problems and confusion.    Vital Signs:  T: 97.7; BP: 160/75; P: 73; R: 16   Physical Exam  Constitutional: She is oriented to person, place, and time. She appears well-nourished.  HENT:  Head: Atraumatic.  Eyes: EOM are normal.  Neck: Neck supple.  Cardiovascular: Regular rhythm.   Irreg rate   Pulmonary/Chest: Effort normal and breath sounds normal. She has no wheezes.  Abdominal: Soft. Bowel sounds are normal. There is no tenderness.  Musculoskeletal: Normal range of motion.  Neurological: She is alert and oriented to person, place, and time.  Skin: Skin is warm and dry.  Psychiatric: She has a normal mood and affect. Her behavior is normal. Judgment and thought content normal.  Nursing note and vitals reviewed.   Mallampati Score:  MD Evaluation Airway: WNL Heart: WNL Abdomen: WNL Chest/ Lungs: WNL ASA  Classification: 2 Mallampati/Airway Score: One  Imaging: Ir Angio Intra Extracran Sel Com Carotid Innominate Bilat Mod Sed  Result Date: 01/01/2017 INDICATION: Right-sided headaches. Previous history of left cerebral hemispheric ischemic infarcts. Discovery of an unruptured anterior communicating artery region aneurysm on MRA examination of the brain. EXAM: BILATERAL COMMON CAROTID AND INNOMINATE ANGIOGRAPHY AND BILATERAL VERTEBRAL ARTERY ANGIOGRAMS MEDICATIONS: Heparin 1,500 units IV. No antibiotic was administered within 1 hour of the procedure. ANESTHESIA/SEDATION: As per general anesthesia who provided MAC anesthesia. FLUOROSCOPY TIME:   Fluoroscopy Time: 11 minutes 30 seconds (1457 mGy). COMPLICATIONS: None immediate. TECHNIQUE: Informed written consent was obtained from the patient after a thorough discussion of the procedural risks, benefits and alternatives. All questions were addressed. Maximal Sterile Barrier Technique was utilized including caps, mask, sterile gowns, sterile gloves, sterile drape, hand hygiene and skin antiseptic. A timeout was performed prior to the initiation of the procedure. PROCEDURE: The right groin was prepped and draped in the usual sterile fashion. Thereafter using modified Seldinger technique, transfemoral access into the right common femoral artery was obtained without difficulty. Over a 0.035 inch guidewire, a 5 French Pinnacle sheath was inserted. Through this, and also over 0.035 inch guidewire, a 5 French JB1 catheter was advanced to the aortic arch region and selectively positioned in the right common carotid artery, the right subclavian artery, the left common carotid artery and the left vertebral artery. FINDINGS: The right subclavian arteriogram demonstrates mild stenosis at the origin of the right vertebral artery. The vessel is, otherwise, seen to opacify to the cranial skull base were it supplies primarily the ipsilateral right posterior-inferior cerebellar artery. The left vertebral artery origin is from the aortic arch between the origins of the left common carotid artery and the left subclavian arteries.  Moderate tortuosity is seen of the proximal 1/3 of the left vertebral artery. The vessel, otherwise, is seen to opacify to the cranial skull base. Wide patency is seen of the left vertebrobasilar junction and the left the posterior inferior cerebellar artery. The basilar artery, the right posterior cerebral artery, the superior cerebellar arteries and the anterior-inferior cerebellar arteries are seen to opacify into the capillary and venous phases. Partial opacification of the left posterior  cerebral artery is probably related to inflow from the anterior circulation via the posterior communicating artery. There is no washout of contrast from within the basilar artery from the contralateral right vertebral artery. The right common carotid arteriogram demonstrates the right external carotid artery and its major branches to be widely patent. The right internal carotid artery at the bulb to the cranial skull base opacifies normally. The petrous, cavernous and supraclinoid segments demonstrate wide patency. A right posterior communicating artery is seen with flash filling of the right posterior cerebral artery. The right middle cerebral artery and the right anterior cerebral artery are seen to opacify into the capillary and venous phases. Arising in the anterior communicating artery region at the junction of right anterior cerebral A1 segment and A2 segment is a bilobed saccular aneurysm which measures approximately 4.5 mm x 4.2 mm. It fills primarily from the right internal carotid artery injection. The left common carotid arteriogram demonstrates the left external carotid artery and its major branches to be widely patent. The left internal carotid artery at the bulb to the cranial skull base demonstrates wide patency. The petrous, cavernous and the supraclinoid segments are widely patent. A left posterior communicating artery is seen opacifying the left posterior cerebral artery distribution. The left middle cerebral artery and the left anterior cerebral artery opacify into the capillary and venous phases. IMPRESSION: Approximately 4.5 mm x 4.2 mm bilobed slightly irregular anterior communicating artery region aneurysm at the level of the right A1 A2 junction. The angiographic findings were reviewed with the patient and the patient's family. The patient will be scheduled for endovascular treatment of the anterior communicating artery region under anesthesia. The patient will be started on Brilinta 90 mg  b.i.d. Plavix will be stopped as the patient appears to be resistant to this. This was explained to the patient and the patient's family. They are in agreement to proceed as above. Electronically Signed   By: Luanne Bras M.D.   On: 12/30/2016 12:20   Ir Angio Vertebral Sel Subclavian Innominate Uni R Mod Sed  Result Date: 01/01/2017 INDICATION: Right-sided headaches. Previous history of left cerebral hemispheric ischemic infarcts. Discovery of an unruptured anterior communicating artery region aneurysm on MRA examination of the brain. EXAM: BILATERAL COMMON CAROTID AND INNOMINATE ANGIOGRAPHY AND BILATERAL VERTEBRAL ARTERY ANGIOGRAMS MEDICATIONS: Heparin 1,500 units IV. No antibiotic was administered within 1 hour of the procedure. ANESTHESIA/SEDATION: As per general anesthesia who provided MAC anesthesia. FLUOROSCOPY TIME:  Fluoroscopy Time: 11 minutes 30 seconds (1457 mGy). COMPLICATIONS: None immediate. TECHNIQUE: Informed written consent was obtained from the patient after a thorough discussion of the procedural risks, benefits and alternatives. All questions were addressed. Maximal Sterile Barrier Technique was utilized including caps, mask, sterile gowns, sterile gloves, sterile drape, hand hygiene and skin antiseptic. A timeout was performed prior to the initiation of the procedure. PROCEDURE: The right groin was prepped and draped in the usual sterile fashion. Thereafter using modified Seldinger technique, transfemoral access into the right common femoral artery was obtained without difficulty. Over a 0.035 inch guidewire, a 5  French Pinnacle sheath was inserted. Through this, and also over 0.035 inch guidewire, a 5 French JB1 catheter was advanced to the aortic arch region and selectively positioned in the right common carotid artery, the right subclavian artery, the left common carotid artery and the left vertebral artery. FINDINGS: The right subclavian arteriogram demonstrates mild stenosis at  the origin of the right vertebral artery. The vessel is, otherwise, seen to opacify to the cranial skull base were it supplies primarily the ipsilateral right posterior-inferior cerebellar artery. The left vertebral artery origin is from the aortic arch between the origins of the left common carotid artery and the left subclavian arteries. Moderate tortuosity is seen of the proximal 1/3 of the left vertebral artery. The vessel, otherwise, is seen to opacify to the cranial skull base. Wide patency is seen of the left vertebrobasilar junction and the left the posterior inferior cerebellar artery. The basilar artery, the right posterior cerebral artery, the superior cerebellar arteries and the anterior-inferior cerebellar arteries are seen to opacify into the capillary and venous phases. Partial opacification of the left posterior cerebral artery is probably related to inflow from the anterior circulation via the posterior communicating artery. There is no washout of contrast from within the basilar artery from the contralateral right vertebral artery. The right common carotid arteriogram demonstrates the right external carotid artery and its major branches to be widely patent. The right internal carotid artery at the bulb to the cranial skull base opacifies normally. The petrous, cavernous and supraclinoid segments demonstrate wide patency. A right posterior communicating artery is seen with flash filling of the right posterior cerebral artery. The right middle cerebral artery and the right anterior cerebral artery are seen to opacify into the capillary and venous phases. Arising in the anterior communicating artery region at the junction of right anterior cerebral A1 segment and A2 segment is a bilobed saccular aneurysm which measures approximately 4.5 mm x 4.2 mm. It fills primarily from the right internal carotid artery injection. The left common carotid arteriogram demonstrates the left external carotid artery and  its major branches to be widely patent. The left internal carotid artery at the bulb to the cranial skull base demonstrates wide patency. The petrous, cavernous and the supraclinoid segments are widely patent. A left posterior communicating artery is seen opacifying the left posterior cerebral artery distribution. The left middle cerebral artery and the left anterior cerebral artery opacify into the capillary and venous phases. IMPRESSION: Approximately 4.5 mm x 4.2 mm bilobed slightly irregular anterior communicating artery region aneurysm at the level of the right A1 A2 junction. The angiographic findings were reviewed with the patient and the patient's family. The patient will be scheduled for endovascular treatment of the anterior communicating artery region under anesthesia. The patient will be started on Brilinta 90 mg b.i.d. Plavix will be stopped as the patient appears to be resistant to this. This was explained to the patient and the patient's family. They are in agreement to proceed as above. Electronically Signed   By: Luanne Bras M.D.   On: 12/30/2016 12:20   Ir Angio Vertebral Sel Vertebral Uni L Mod Sed  Result Date: 01/01/2017 INDICATION: Right-sided headaches. Previous history of left cerebral hemispheric ischemic infarcts. Discovery of an unruptured anterior communicating artery region aneurysm on MRA examination of the brain. EXAM: BILATERAL COMMON CAROTID AND INNOMINATE ANGIOGRAPHY AND BILATERAL VERTEBRAL ARTERY ANGIOGRAMS MEDICATIONS: Heparin 1,500 units IV. No antibiotic was administered within 1 hour of the procedure. ANESTHESIA/SEDATION: As per general anesthesia who  provided MAC anesthesia. FLUOROSCOPY TIME:  Fluoroscopy Time: 11 minutes 30 seconds (1457 mGy). COMPLICATIONS: None immediate. TECHNIQUE: Informed written consent was obtained from the patient after a thorough discussion of the procedural risks, benefits and alternatives. All questions were addressed. Maximal Sterile  Barrier Technique was utilized including caps, mask, sterile gowns, sterile gloves, sterile drape, hand hygiene and skin antiseptic. A timeout was performed prior to the initiation of the procedure. PROCEDURE: The right groin was prepped and draped in the usual sterile fashion. Thereafter using modified Seldinger technique, transfemoral access into the right common femoral artery was obtained without difficulty. Over a 0.035 inch guidewire, a 5 French Pinnacle sheath was inserted. Through this, and also over 0.035 inch guidewire, a 5 French JB1 catheter was advanced to the aortic arch region and selectively positioned in the right common carotid artery, the right subclavian artery, the left common carotid artery and the left vertebral artery. FINDINGS: The right subclavian arteriogram demonstrates mild stenosis at the origin of the right vertebral artery. The vessel is, otherwise, seen to opacify to the cranial skull base were it supplies primarily the ipsilateral right posterior-inferior cerebellar artery. The left vertebral artery origin is from the aortic arch between the origins of the left common carotid artery and the left subclavian arteries. Moderate tortuosity is seen of the proximal 1/3 of the left vertebral artery. The vessel, otherwise, is seen to opacify to the cranial skull base. Wide patency is seen of the left vertebrobasilar junction and the left the posterior inferior cerebellar artery. The basilar artery, the right posterior cerebral artery, the superior cerebellar arteries and the anterior-inferior cerebellar arteries are seen to opacify into the capillary and venous phases. Partial opacification of the left posterior cerebral artery is probably related to inflow from the anterior circulation via the posterior communicating artery. There is no washout of contrast from within the basilar artery from the contralateral right vertebral artery. The right common carotid arteriogram demonstrates the  right external carotid artery and its major branches to be widely patent. The right internal carotid artery at the bulb to the cranial skull base opacifies normally. The petrous, cavernous and supraclinoid segments demonstrate wide patency. A right posterior communicating artery is seen with flash filling of the right posterior cerebral artery. The right middle cerebral artery and the right anterior cerebral artery are seen to opacify into the capillary and venous phases. Arising in the anterior communicating artery region at the junction of right anterior cerebral A1 segment and A2 segment is a bilobed saccular aneurysm which measures approximately 4.5 mm x 4.2 mm. It fills primarily from the right internal carotid artery injection. The left common carotid arteriogram demonstrates the left external carotid artery and its major branches to be widely patent. The left internal carotid artery at the bulb to the cranial skull base demonstrates wide patency. The petrous, cavernous and the supraclinoid segments are widely patent. A left posterior communicating artery is seen opacifying the left posterior cerebral artery distribution. The left middle cerebral artery and the left anterior cerebral artery opacify into the capillary and venous phases. IMPRESSION: Approximately 4.5 mm x 4.2 mm bilobed slightly irregular anterior communicating artery region aneurysm at the level of the right A1 A2 junction. The angiographic findings were reviewed with the patient and the patient's family. The patient will be scheduled for endovascular treatment of the anterior communicating artery region under anesthesia. The patient will be started on Brilinta 90 mg b.i.d. Plavix will be stopped as the patient appears to be  resistant to this. This was explained to the patient and the patient's family. They are in agreement to proceed as above. Electronically Signed   By: Luanne Bras M.D.   On: 12/30/2016 12:20     Labs:  CBC:  Recent Labs  12/30/16 0632 01/06/17 1130  WBC 6.7 7.9  HGB 14.1 14.4  HCT 41.7 41.9  PLT 275 284    COAGS:  Recent Labs  12/30/16 0632 01/06/17 1130 01/06/17 1150  INR 1.05 1.02  --   APTT 27  --  27    BMP:  Recent Labs  12/30/16 0632 01/06/17 1150  NA 137 136  K 3.5 3.9  CL 103 102  CO2 23 24  GLUCOSE 105* 93  BUN 10 9  CALCIUM 9.2 9.4  CREATININE 0.77 0.78  GFRNONAA >60 >60  GFRAA >60 >60    LIVER FUNCTION TESTS:  Recent Labs  12/30/16 0632 01/06/17 1150  BILITOT 0.8 0.9  AST 23 26  ALT 18 20  ALKPHOS 60 61  PROT 7.5 7.3  ALBUMIN 4.2 4.3    TUMOR MARKERS: No results for input(s): AFPTM, CEA, CA199, CHROMGRNA in the last 8760 hours.  Assessment and Plan:  Anterior communicating artery aneurysm Scheduled for embolization in IR with Dr Estanislado Pandy Risks and Benefits discussed with the patient including, but not limited to bleeding, infection, vascular injury, contrast induced renal failure, stroke or even death. All of the patient's questions were answered, patient is agreeable to proceed. Consent signed and in chart.  Pt and family are aware she will be admitted into Neuro ICU overnight - if intervention is performed. Plan for discharge following day. Agreeable to plan.  Thank you for this interesting consult.  I greatly enjoyed meeting Sara Frye and look forward to participating in their care.  A copy of this report was sent to the requesting provider on this date.  Electronically Signed: Bedford Winsor A 01/07/2017, 7:51 AM   I spent a total of  30 Minutes   in face to face in clinical consultation, greater than 50% of which was counseling/coordinating care for [facility] aneurysm embolization

## 2017-01-07 NOTE — Anesthesia Postprocedure Evaluation (Signed)
Anesthesia Post Note  Patient: Sara Frye  Procedure(s) Performed: Procedure(s) (LRB): EMBOLIZATION (N/A)  Patient location during evaluation: PACU Anesthesia Type: General Level of consciousness: awake and alert Pain management: pain level controlled Vital Signs Assessment: post-procedure vital signs reviewed and stable Respiratory status: spontaneous breathing, nonlabored ventilation, respiratory function stable and patient connected to nasal cannula oxygen Cardiovascular status: blood pressure returned to baseline and stable Postop Assessment: no signs of nausea or vomiting Anesthetic complications: no       Last Vitals:  Vitals:   01/07/17 1445 01/07/17 1500  BP:    Pulse: (!) 59 (!) 34  Resp: 14 12  Temp:      Last Pain:  Vitals:   01/07/17 1251  TempSrc:   PainSc: Asleep                 Effie Berkshire

## 2017-01-07 NOTE — Progress Notes (Signed)
Patient assessed at bedside this afternoon with Dr. Estanislado Pandy.  Patient denies current headache, nausea, or vomiting.  She does have a sore throat from ETT.  Neuro exam stable and consistent with baseline.  Groin site intact.  Distal pulses present.  To advance diet as tolerated.  Will plan to reassess in the AM for possible discharge home.   Brynda Greathouse, MMS RDN PA-C 4:35 PM

## 2017-01-07 NOTE — Sedation Documentation (Signed)
Pt brought to IR #2 by anesthesia. Pt awake and alert. In no distress. Speech clear and appropriate.

## 2017-01-07 NOTE — Progress Notes (Signed)
ANTICOAGULATION CONSULT NOTE - Initial Consult  Pharmacy Consult for heparin Indication: s/p embolization of aneurysm  Allergies  Allergen Reactions  . Peanut-Containing Drug Products Itching  . Shellfish Allergy Itching   Patient Measurements: Height: 5' (152.4 cm) Weight: 187 lb (84.8 kg) IBW/kg (Calculated) : 45.5 Heparin Dosing Weight: 65.3 kg   Assessment: Found incidental aneurysm in patient, now s/p R common carotid arteriogram and embolization of aneurysm. To continue heparin at low rate until tomorrow morning. SCr and CBC wnl. First heparin level slightly above goal at 0.27.  Goal of Therapy:  Heparin level goal: 0.1-0.25 units/mL Monitor platelets by anticoagulation protocol: Yes  Plan:  Decrease heparin gtt to 450 units/hr Monitor CBC, s/s of bleed Stop heparin gtt at [place], PharmD, BCPS Clinical Pharmacist Pager [phone] 01/07/2017 8:47 PM

## 2017-01-07 NOTE — Anesthesia Preprocedure Evaluation (Addendum)
Anesthesia Evaluation  Patient identified by MRN, date of birth, ID band Patient awake    Reviewed: Allergy & Precautions, NPO status , Patient's Chart, lab work & pertinent test results  Airway Mallampati: II       Dental  (+) Upper Dentures, Dental Advisory Given   Pulmonary neg pulmonary ROS,    breath sounds clear to auscultation       Cardiovascular hypertension, Pt. on medications + Peripheral Vascular Disease  negative cardio ROS  + dysrhythmias Atrial Fibrillation  Rhythm:Regular     Neuro/Psych  Headaches,  Neuromuscular disease CVA negative psych ROS   GI/Hepatic negative GI ROS, Neg liver ROS,   Endo/Other  Hypothyroidism   Renal/GU negative Renal ROS  negative genitourinary   Musculoskeletal  (+) Arthritis ,   Abdominal   Peds negative pediatric ROS (+)  Hematology negative hematology ROS (+)   Anesthesia Other Findings Day of surgery medications reviewed with the patient.  Reproductive/Obstetrics negative OB ROS                           Lab Results  Component Value Date   WBC 7.9 01/06/2017   HGB 14.4 01/06/2017   HCT 41.9 01/06/2017   MCV 84.3 01/06/2017   PLT 284 01/06/2017   Lab Results  Component Value Date   CREATININE 0.78 01/06/2017   BUN 9 01/06/2017   NA 136 01/06/2017   K 3.9 01/06/2017   CL 102 01/06/2017   CO2 24 01/06/2017   Lab Results  Component Value Date   INR 1.02 01/06/2017   INR 1.05 12/30/2016   EKG: normal sinus rhythm, PAC's noted.  Echo: - Left ventricle: The cavity size was normal. Wall thickness was   increased in a pattern of moderate LVH. Systolic function was   normal. The estimated ejection fraction was in the range of 60%   to 65%. Wall motion was normal; there were no regional wall   motion abnormalities. Doppler parameters are consistent with   abnormal left ventricular relaxation (grade 1 diastolic   dysfunction). Doppler  parameters are consistent with high   ventricular filling pressure. - Mitral valve: Calcified annulus. - Left atrium: The atrium was severely dilated.    Anesthesia Physical Anesthesia Plan  ASA: III  Anesthesia Plan: General   Post-op Pain Management:    Induction: Intravenous  Airway Management Planned: Oral ETT  Additional Equipment: Arterial line  Intra-op Plan:   Post-operative Plan: Possible Post-op intubation/ventilation  Informed Consent: I have reviewed the patients History and Physical, chart, labs and discussed the procedure including the risks, benefits and alternatives for the proposed anesthesia with the patient or authorized representative who has indicated his/her understanding and acceptance.   Dental advisory given  Plan Discussed with: CRNA  Anesthesia Plan Comments:         Anesthesia Quick Evaluation

## 2017-01-07 NOTE — Procedures (Signed)
S/P RT common carotid arteriogram,followed endovascular obliteration of Acom aneurysm with stent assisted coiling.

## 2017-01-08 ENCOUNTER — Encounter (HOSPITAL_COMMUNITY): Payer: Self-pay | Admitting: Interventional Radiology

## 2017-01-08 DIAGNOSIS — I671 Cerebral aneurysm, nonruptured: Secondary | ICD-10-CM | POA: Diagnosis not present

## 2017-01-08 LAB — BASIC METABOLIC PANEL
ANION GAP: 5 (ref 5–15)
BUN: 7 mg/dL (ref 6–20)
CALCIUM: 8.3 mg/dL — AB (ref 8.9–10.3)
CO2: 23 mmol/L (ref 22–32)
Chloride: 109 mmol/L (ref 101–111)
Creatinine, Ser: 0.65 mg/dL (ref 0.44–1.00)
GFR calc non Af Amer: >60 mL/min (ref >60)
Glucose, Bld: 111 mg/dL — ABNORMAL HIGH (ref 65–99)
Potassium: 3.5 mmol/L (ref 3.5–5.1)
Sodium: 137 mmol/L (ref 135–145)

## 2017-01-08 LAB — CBC WITH DIFFERENTIAL/PLATELET
BASOS PCT: 0 %
Basophils Absolute: 0 10*3/uL (ref 0.0–0.1)
Eosinophils Absolute: 0 10*3/uL (ref 0.0–0.7)
Eosinophils Relative: 0 %
HEMATOCRIT: 32.4 % — AB (ref 36.0–46.0)
HEMOGLOBIN: 11.4 g/dL — AB (ref 12.0–15.0)
Lymphocytes Relative: 20 %
Lymphs Abs: 1.8 10*3/uL (ref 0.7–4.0)
MCH: 29.5 pg (ref 26.0–34.0)
MCHC: 35.2 g/dL (ref 30.0–36.0)
MCV: 83.9 fL (ref 78.0–100.0)
MONOS PCT: 6 %
Monocytes Absolute: 0.5 10*3/uL (ref 0.1–1.0)
NEUTROS ABS: 6.5 10*3/uL (ref 1.7–7.7)
NEUTROS PCT: 75 %
Platelets: 197 10*3/uL (ref 150–400)
RBC: 3.86 MIL/uL — AB (ref 3.87–5.11)
RDW: 14.2 % (ref 11.5–15.5)
WBC: 8.8 10*3/uL (ref 4.0–10.5)

## 2017-01-08 LAB — PLATELET INHIBITION P2Y12: PLATELET FUNCTION P2Y12: 105 [PRU] — AB (ref 194–418)

## 2017-01-08 LAB — MRSA PCR SCREENING: MRSA by PCR: NEGATIVE

## 2017-01-08 MED ORDER — SODIUM CHLORIDE 0.9 % IV BOLUS (SEPSIS)
250.0000 mL | Freq: Once | INTRAVENOUS | Status: AC
  Administered 2017-01-08: 250 mL via INTRAVENOUS

## 2017-01-08 NOTE — Progress Notes (Signed)
MD paged about BP not meeting parameters of SBP 120-140. New order of another 250 normal saline bolus and new goal of SBP>90. Will continue to monitor.

## 2017-01-08 NOTE — Discharge Summary (Signed)
Patient ID: Sara Frye MRN: [id number] DOB/AGE: 81-Aug-1930 81 y.o.  Admit date: 01/07/2017 Discharge date: 01/08/2017  Supervising Physician: Luanne Bras  Patient Status: [facility] - In-pt  Admission Diagnoses: anterior communicating artery aneurysm  Discharge Diagnoses:  Active Problems:   Brain aneurysm   Discharged Condition: stable  Hospital Course: Anterior communicating artery aneurysm. Embolization with cloiing and stent placement performed in IR with Dr Estanislado Pandy 01/07/17. Tolerated well and without complications. Admitted overnight to Neuro ICU- no events. Eating well; no N/V; denies neurologic symptoms- seeing and speaking well. Passing gas Dr Estanislado Pandy has seen and examine pt. Moving all4s equally. Plan for discharge to home Follow up appt 2 weeks   Consults: None  Significant Diagnostic Studies: Cerebral arteriogram   Treatments: S/P RT common carotid arteriogram,followed endovascular obliteration of Acom aneurysm with stent assisted coiling.  Discharge Exam: Blood pressure 124/76, pulse 63, temperature 97.9 F (36.6 C), temperature source Oral, resp. rate 16, height 5' (1.524 m), weight 187 lb (84.8 kg), SpO2 98 %.  PE: A/O Appropriate Face symmetrical Tongue midline Heart RRR Lungs CTA Abd soft +BS; NT Extr FROM up in chair; moving all 4s Good strength and sensation B Rt groin NT no bleeding No hematoma Rt foot +doppler pulses  UOP great- yellow  Results for orders placed or performed during the hospital encounter of 01/07/17  MRSA PCR Screening  Result Value Ref Range   MRSA by PCR NEGATIVE NEGATIVE  Heparin level (unfractionated)  Result Value Ref Range   Heparin Unfractionated 0.27 (L) 0.30 - 0.70 IU/mL  Basic metabolic panel  Result Value Ref Range   Sodium 137 135 - 145 mmol/L   Potassium 3.5 3.5 - 5.1 mmol/L   Chloride 109 101 - 111 mmol/L   CO2 23 22 - 32 mmol/L   Glucose, Bld 111 (H) 65 - 99 mg/dL   BUN 7 6 - 20  mg/dL   Creatinine, Ser 0.65 0.44 - 1.00 mg/dL   Calcium 8.3 (L) 8.9 - 10.3 mg/dL   GFR calc non Af Amer >60 >60 mL/min   GFR calc Af Amer >60 >60 mL/min   Anion gap 5 5 - 15  CBC WITH DIFFERENTIAL  Result Value Ref Range   WBC 8.8 4.0 - 10.5 K/uL   RBC 3.86 (L) 3.87 - 5.11 MIL/uL   Hemoglobin 11.4 (L) 12.0 - 15.0 g/dL   HCT 32.4 (L) 36.0 - 46.0 %   MCV 83.9 78.0 - 100.0 fL   MCH 29.5 26.0 - 34.0 pg   MCHC 35.2 30.0 - 36.0 g/dL   RDW 14.2 11.5 - 15.5 %   Platelets 197 150 - 400 K/uL   Neutrophils Relative % 75 %   Neutro Abs 6.5 1.7 - 7.7 K/uL   Lymphocytes Relative 20 %   Lymphs Abs 1.8 0.7 - 4.0 K/uL   Monocytes Relative 6 %   Monocytes Absolute 0.5 0.1 - 1.0 K/uL   Eosinophils Relative 0 %   Eosinophils Absolute 0.0 0.0 - 0.7 K/uL   Basophils Relative 0 %   Basophils Absolute 0.0 0.0 - 0.1 K/uL  Platelet inhibition p2y12 (not at [facility])  Result Value Ref Range   Platelet Function  P2Y12 105 (L) 194 - 418 PRU  POCT Activated clotting time  Result Value Ref Range   Activated Clotting Time 158 seconds  POCT Activated clotting time  Result Value Ref Range   Activated Clotting Time 169 seconds  POCT Activated clotting time  Result Value Ref Range  Activated Clotting Time 180 seconds     Disposition:  Anterior communicating artery aneurysm embolization in IR with Dr Estanislado Pandy 6/96/78 No complication Doing well Plan for discharge to home today Continue all home meds Daughter to discuss with Dr Audelia Acton Rx Continue ASA and Brilinta 90 mg BID To follow up with Dr Estanislado Pandy 2 weeks Pt and dtr have good understanding of discharge instructions   Discharge Instructions    Call MD for:  difficulty breathing, headache or visual disturbances    Complete by:  As directed    Call MD for:  extreme fatigue    Complete by:  As directed    Call MD for:  hives    Complete by:  As directed    Call MD for:  persistant dizziness or light-headedness    Complete by:  As  directed    Call MD for:  persistant nausea and vomiting    Complete by:  As directed    Call MD for:  redness, tenderness, or signs of infection (pain, swelling, redness, odor or green/yellow discharge around incision site)    Complete by:  As directed    Call MD for:  severe uncontrolled pain    Complete by:  As directed    Call MD for:  temperature >100.4    Complete by:  As directed    Diet - low sodium heart healthy    Complete by:  As directed    Discharge instructions    Complete by:  As directed    Resume all home meds; pt will hear from scheduler for 2 week follow up appt; call [phone] if questions or concerns   Discharge wound care:    Complete by:  As directed    May shower tomorrow; may change bandage at Rt groin to band aid after shower; keep clean band aid on Rt groin site daily x 1 week   Driving Restrictions    Complete by:  As directed    No driving x 2 weeks   Increase activity slowly    Complete by:  As directed    Lifting restrictions    Complete by:  As directed    No lifting over 10 lbs x 2 weeks     Allergies as of 01/08/2017      Reactions   Peanut-containing Drug Products Itching   Shellfish Allergy Itching      Medication List    TAKE these medications   aspirin 81 MG tablet Take 81 mg by mouth daily.   hydrochlorothiazide 25 MG tablet Commonly known as:  HYDRODIURIL Take 25 mg by mouth daily.   levothyroxine 25 MCG tablet Commonly known as:  SYNTHROID, LEVOTHROID Take 25 mcg by mouth daily.   meclizine 25 MG tablet Commonly known as:  ANTIVERT Take 12.5 mg by mouth 3 (three) times daily as needed for dizziness.   rosuvastatin 5 MG tablet Commonly known as:  CRESTOR Take 1 tablet (5 mg total) by mouth daily at 6 PM. What changed:  how much to take  when to take this   ticagrelor 90 MG Tabs tablet Commonly known as:  BRILINTA Take 1 tablet (90 mg total) by mouth 2 (two) times daily.      Follow-up Information     Luanne Bras, MD Follow up in 2 week(s).   Specialty:  Interventional Radiology Why:  scheduler will call pt with time and date of 2 week follow up appt; call [phone] if questions or concerns Contact  information: [address] [place] [address] [phone]            Electronically Signed: Monia Sabal A, PA-C 01/08/2017, 12:36 PM   I have spent Greater Than 30 Minutes discharging [place].

## 2017-01-13 ENCOUNTER — Encounter (HOSPITAL_COMMUNITY): Payer: Self-pay | Admitting: Interventional Radiology

## 2017-01-22 NOTE — Addendum Note (Signed)
Addendum  created 01/22/17 1222 by Effie Berkshire, MD   Sign clinical note

## 2017-01-22 NOTE — Anesthesia Postprocedure Evaluation (Signed)
Anesthesia Post Note  Patient: Raylea P Hurlbutt  Procedure(s) Performed: Procedure(s) (LRB): EMBOLIZATION (N/A)     Anesthesia Post Evaluation  Last Vitals:  Vitals:   01/08/17 1400 01/08/17 1418  BP:  (!) 111/99  Pulse: 67 66  Resp: 17 20  Temp:      Last Pain:  Vitals:   01/08/17 1212  TempSrc: Oral  PainSc:                  Effie Berkshire

## 2017-01-25 ENCOUNTER — Ambulatory Visit (HOSPITAL_COMMUNITY)
Admission: RE | Admit: 2017-01-25 | Discharge: 2017-01-25 | Disposition: A | Discharge status: Home / Self Care | Payer: Medicare Other | Source: Ambulatory Visit | Attending: Radiology | Admitting: Radiology

## 2017-01-25 DIAGNOSIS — R51 Headache: Secondary | ICD-10-CM | POA: Diagnosis not present

## 2017-01-25 DIAGNOSIS — R0602 Shortness of breath: Secondary | ICD-10-CM | POA: Diagnosis not present

## 2017-01-25 DIAGNOSIS — I671 Cerebral aneurysm, nonruptured: Secondary | ICD-10-CM

## 2017-01-25 HISTORY — PX: IR RADIOLOGIST EVAL & MGMT: IMG5224

## 2017-01-26 ENCOUNTER — Encounter (HOSPITAL_COMMUNITY): Payer: Self-pay | Admitting: Interventional Radiology

## 2017-02-01 DIAGNOSIS — I480 Paroxysmal atrial fibrillation: Secondary | ICD-10-CM | POA: Diagnosis not present

## 2017-02-01 DIAGNOSIS — I10 Essential (primary) hypertension: Secondary | ICD-10-CM | POA: Diagnosis not present

## 2017-02-01 DIAGNOSIS — I671 Cerebral aneurysm, nonruptured: Secondary | ICD-10-CM | POA: Diagnosis not present

## 2017-02-15 DIAGNOSIS — E7800 Pure hypercholesterolemia, unspecified: Secondary | ICD-10-CM | POA: Diagnosis not present

## 2017-02-15 DIAGNOSIS — I10 Essential (primary) hypertension: Secondary | ICD-10-CM | POA: Diagnosis not present

## 2017-02-15 DIAGNOSIS — I6310 Cerebral infarction due to embolism of unspecified precerebral artery: Secondary | ICD-10-CM | POA: Diagnosis not present

## 2017-02-15 DIAGNOSIS — I480 Paroxysmal atrial fibrillation: Secondary | ICD-10-CM | POA: Diagnosis not present

## 2017-03-02 DIAGNOSIS — I10 Essential (primary) hypertension: Secondary | ICD-10-CM | POA: Diagnosis not present

## 2017-03-02 DIAGNOSIS — I480 Paroxysmal atrial fibrillation: Secondary | ICD-10-CM | POA: Diagnosis not present

## 2017-03-02 DIAGNOSIS — I671 Cerebral aneurysm, nonruptured: Secondary | ICD-10-CM | POA: Diagnosis not present

## 2017-03-09 ENCOUNTER — Ambulatory Visit: Payer: Medicare Other | Admitting: Nurse Practitioner

## 2017-03-09 ENCOUNTER — Ambulatory Visit (INDEPENDENT_AMBULATORY_CARE_PROVIDER_SITE_OTHER): Payer: Medicare Other | Admitting: Nurse Practitioner

## 2017-03-09 ENCOUNTER — Encounter: Payer: Self-pay | Admitting: Nurse Practitioner

## 2017-03-09 VITALS — BP 128/68 | HR 56 | Wt 185.2 lb

## 2017-03-09 DIAGNOSIS — I10 Essential (primary) hypertension: Secondary | ICD-10-CM | POA: Diagnosis not present

## 2017-03-09 DIAGNOSIS — Z8673 Personal history of transient ischemic attack (TIA), and cerebral infarction without residual deficits: Secondary | ICD-10-CM | POA: Diagnosis not present

## 2017-03-09 DIAGNOSIS — E785 Hyperlipidemia, unspecified: Secondary | ICD-10-CM

## 2017-03-09 DIAGNOSIS — I480 Paroxysmal atrial fibrillation: Secondary | ICD-10-CM

## 2017-03-09 DIAGNOSIS — I6340 Cerebral infarction due to embolism of unspecified cerebral artery: Secondary | ICD-10-CM

## 2017-03-09 NOTE — Patient Instructions (Addendum)
Continue eliquis and crestor for stroke prevention Continue to follow up with Dr. Einar Gip for afib management Continue follow up with  Dr. Estanislado Pandy regarding aneurysm management following stent placement.   Continue to follow up with your primary care physician Dr. Shelia Media for stroke risk factor modification. Recommend maintain blood pressure goal <130/80, diabetes with hemoglobin A1c goal below 7.0% and lipids with LDL cholesterol goal below 70 mg/dL.  Will discharge from stroke clinic Stroke Prevention Some medical conditions and behaviors are associated with an increased chance of having a stroke. You may prevent a stroke by making healthy choices and managing medical conditions. How can I reduce my risk of having a stroke?  Stay physically active. Get at least 30 minutes of activity on most or all days.  Do not smoke. It may also be helpful to avoid exposure to secondhand smoke.  Limit alcohol use. Moderate alcohol use is considered to be: ? No more than 2 drinks per day for men. ? No more than 1 drink per day for nonpregnant women.  Eat healthy foods. This involves: ? Eating 5 or more servings of fruits and vegetables a day. ? Making dietary changes that address high blood pressure (hypertension), high cholesterol, diabetes, or obesity.  Manage your cholesterol levels. ? Making food choices that are high in fiber and low in saturated fat, trans fat, and cholesterol may control cholesterol levels. ? Take any prescribed medicines to control cholesterol as directed by your health care provider.  Manage your diabetes. ? Controlling your carbohydrate and sugar intake is recommended to manage diabetes. ? Take any prescribed medicines to control diabetes as directed by your health care provider.  Control your hypertension. ? Making food choices that are low in salt (sodium), saturated fat, trans fat, and cholesterol is recommended to manage hypertension. ? Ask your health care provider if  you need treatment to lower your blood pressure. Take any prescribed medicines to control hypertension as directed by your health care provider. ? If you are 7-45 years of age, have your blood pressure checked every 3-5 years. If you are 27 years of age or older, have your blood pressure checked every year.  Maintain a healthy weight. ? Reducing calorie intake and making food choices that are low in sodium, saturated fat, trans fat, and cholesterol are recommended to manage weight.  Stop drug abuse.  Avoid taking birth control pills. ? Talk to your health care provider about the risks of taking birth control pills if you are over 15 years old, smoke, get migraines, or have ever had a blood clot.  Get evaluated for sleep disorders (sleep apnea). ? Talk to your health care provider about getting a sleep evaluation if you snore a lot or have excessive sleepiness.  Take medicines only as directed by your health care provider. ? For some people, aspirin or blood thinners (anticoagulants) are helpful in reducing the risk of forming abnormal blood clots that can lead to stroke. If you have the irregular heart rhythm of atrial fibrillation, you should be on a blood thinner unless there is a good reason you cannot take them. ? Understand all your medicine instructions.  Make sure that other conditions (such as anemia or atherosclerosis) are addressed. Get help right away if:  You have sudden weakness or numbness of the face, arm, or leg, especially on one side of the body.  Your face or eyelid droops to one side.  You have sudden confusion.  You have trouble speaking (aphasia) or  understanding.  You have sudden trouble seeing in one or both eyes.  You have sudden trouble walking.  You have dizziness.  You have a loss of balance or coordination.  You have a sudden, severe headache with no known cause.  You have new chest pain or an irregular heartbeat. Any of these symptoms may  represent a serious problem that is an emergency. Do not wait to see if the symptoms will go away. Get medical help at once. Call your local emergency services (911 in [place] Do not drive yourself to the hospital. This information is not intended to replace advice given to you by your health care provider. Make sure you discuss any questions you have with your health care provider. Document Released: 09/17/2004 Document Revised: 01/16/2016 Document Reviewed: 02/10/2013 Elsevier Interactive Patient Education  2017 [employer].

## 2017-03-09 NOTE — Progress Notes (Signed)
[facility]  PATIENT: Sara Frye DOB: [birth year]   REASON FOR VISIT: Follow-up for history of stroke which occurred in April 2017 HISTORY FROM: Patient and son    HISTORY OF PRESENT ILLNESS:UPDATE 07/17/2018CM Sara Frye, 83 year old female returns for follow-up follow-up with history of atrial fibrillation and bilateral MCA infarcts in April 2017. She is currently on eliquis for atrial fibrillation and secondary stroke prevention. She has minimal bruising and no bleeding. She has not had further stroke or TIA symptoms. In addition she is on Crestor for hyperlipidemia without complaints of myalgias. She had stent placement for aneurysm by Dr. Estanislado Pandy on 12/30/2016. She is walking a mile a day. She has appointment with Dr. Shelia Media primary care to follow up for stroke risk factor management. She returns for reevaluation  09/10/16 Dr. Madie Reno.Sara Frye a 83 y.o.femalewith history of atrial fibrillation not on AC, hypertension and hyperlipidemia admitted on 12/16/15 for difficulty with speech output. Symptoms resolved quickly. However, MRI showed bilateral MCA and ACA multifocal punctate infarcts, consistent with cardioembolic likely due to afib not on AC. MRA showed incidental 4-5 mm ACOM aneurysm. CUS and TTE unremarkable. LDL 66 and A1C 6.1. She was on Xarelto in the past but d/c due to GIB. She was put on eliquis this time and continued on crestor 5mg . She was discharged home in good condition.   Follow up 04/15/16 - She is busy recently moving from [place] to here. She has a lot of stress and complains of neck pain stiffiness and tension HA, taking tylenol and helps. No stroke like symptoms. Worries about her aneurysm. BP 124/61. On eliquis, no GIB or other side effects.  Interval History During the interval time, the patient has been doing well. No complains, no bleeding. Pt complains occasional sharp pain in the brain. Has not been contacted by Dr.  Estanislado Pandy. And has not seen Dr. Einar Gip yet. Just finished relocation from [place]. BP at home stable 130/85. Today in clinic 146/75.     REVIEW OF SYSTEMS: Full 14 system review of systems performed and notable only for those listed, all others are neg:  Constitutional: neg  Cardiovascular: neg Ear/Nose/Throat: neg  Skin: neg Eyes: neg Respiratory: neg Gastroitestinal: neg  Hematology/Lymphatic: neg  Endocrine: neg Musculoskeletal:neg Allergy/Immunology: neg Neurological: neg Psychiatric: neg Sleep : neg   ALLERGIES: Allergies  Allergen Reactions  . Peanut-Containing Drug Products Itching  . Shellfish Allergy Itching    HOME MEDICATIONS: Outpatient Medications Prior to Visit  Medication Sig Dispense Refill  . hydrochlorothiazide (HYDRODIURIL) 25 MG tablet Take 25 mg by mouth daily.    Marland Kitchen levothyroxine (SYNTHROID, LEVOTHROID) 25 MCG tablet Take 25 mcg by mouth daily.  0  . meclizine (ANTIVERT) 25 MG tablet Take 12.5 mg by mouth 3 (three) times daily as needed for dizziness.    . rosuvastatin (CRESTOR) 5 MG tablet Take 1 tablet (5 mg total) by mouth daily at 6 PM. (Patient taking differently: Take 2.5 mg by mouth at bedtime. ) 30 tablet 5  . aspirin 81 MG tablet Take 81 mg by mouth daily.    . ticagrelor (BRILINTA) 90 MG TABS tablet Take 1 tablet (90 mg total) by mouth 2 (two) times daily. 60 tablet 1   No facility-administered medications prior to visit.     PAST MEDICAL HISTORY: Past Medical History:  Diagnosis Date  . Aneurysm ([place])   . Arthritis    spine  . Atrial fibrillation ([employer])   . Dysrhythmia  afib  . Headache    with Eliquis  . Hypertension   . Hypothyroidism   . Maternal blood transfusion   . Neuromuscular disorder ([place])    spinal injury- "I'm unsure of walking, I'm very careful"  . Stroke [facility]) 11/2015    PAST SURGICAL HISTORY: Past Surgical History:  Procedure Laterality Date  . ABDOMINAL HYSTERECTOMY    . APPENDECTOMY    . CARPAL TUNNEL  RELEASE Bilateral   . CHOLECYSTECTOMY    . IR ANGIO INTRA EXTRACRAN SEL COM CAROTID INNOMINATE BILAT MOD SED  12/30/2016  . IR ANGIO INTRA EXTRACRAN SEL INTERNAL CAROTID UNI R MOD SED  01/07/2017  . IR ANGIO VERTEBRAL SEL SUBCLAVIAN INNOMINATE UNI R MOD SED  12/30/2016  . IR ANGIO VERTEBRAL SEL VERTEBRAL UNI L MOD SED  12/30/2016  . IR ANGIOGRAM FOLLOW UP STUDY  01/07/2017  . IR ANGIOGRAM FOLLOW UP STUDY  01/07/2017  . IR ANGIOGRAM FOLLOW UP STUDY  01/07/2017  . IR NEURO EACH ADD'L AFTER BASIC UNI RIGHT (Sara)  01/07/2017  . IR RADIOLOGIST EVAL & MGMT  11/24/2016  . IR RADIOLOGIST EVAL & MGMT  01/25/2017  . IR TRANSCATH/EMBOLIZ  01/07/2017  . JOINT REPLACEMENT Bilateral    arthroscopic - knees  . RADIOLOGY WITH ANESTHESIA N/A 12/30/2016   Procedure: RADIOLOGY WITH ANESTHESIA  EMBOLIZATION;  Surgeon: Luanne Bras, MD;  Location: [facility];  Service: Radiology;  Laterality: N/A;  . RADIOLOGY WITH ANESTHESIA N/A 01/07/2017   Procedure: EMBOLIZATION;  Surgeon: Luanne Bras, MD;  Location: [facility];  Service: Radiology;  Laterality: N/A;    FAMILY HISTORY: Family History  Problem Relation Age of Onset  . Heart failure Brother        deceased age 81  . Cancer Brother     SOCIAL HISTORY: Social History   Social History  . Marital status: Married    Spouse name: N/A  . Number of children: 2  . Years of education: N/A   Occupational History  . Not on file.   Social History Main Topics  . Smoking status: Never Smoker  . Smokeless tobacco: Never Used  . Alcohol use No  . Drug use: No  . Sexual activity: Not on file   Other Topics Concern  . Not on file   Social History Narrative   Lives with sig other     PHYSICAL EXAM  Vitals:   03/09/17 0929  BP: 128/68  Pulse: (!) 56  Weight: 185 lb 3.2 oz (84 kg)   Body mass index is 36.17 kg/m.  Generalized: Well developed, in no acute distress  Head: normocephalic and atraumatic,. Oropharynx benign  Neck: Supple, no carotid bruits    Cardiac: Regular rate rhythm, no murmur  Musculoskeletal: No deformity   Neurological examination   Mentation: Alert oriented to time, place, history taking. Attention span and concentration appropriate. Recent and remote memory intact.  Follows all commands speech and language fluent.   Cranial nerve II-XII: Pupils were equal round reactive to light extraocular movements were full, visual field were full on confrontational test. Facial sensation and strength were normal. hearing was intact to finger rubbing bilaterally. Uvula tongue midline. head turning and shoulder shrug were normal and symmetric.Tongue protrusion into cheek strength was normal. Motor: normal bulk and tone, full strength in the BUE, BLE, fine finger movements normal, no pronator drift. No focal weakness Sensory: normal and symmetric to light touch, on the face arms and legs  Coordination: finger-nose-finger, heel-to-shin bilaterally, no dysmetria Reflexes: 1+  upper lower and symmetric, plantar responses were flexor bilaterally. Gait and Station: Rising up from seated position without assistance, normal stance,  moderate stride, good arm swing, smooth turning, able to perform tiptoe, and heel walking without difficulty. Tandem gait is unsteady. No assistive device  DIAGNOSTIC DATA (LABS, IMAGING, TESTING) - I reviewed patient records, labs, notes, testing and imaging myself where available.  Lab Results  Component Value Date   WBC 8.8 01/08/2017   HGB 11.4 (L) 01/08/2017   HCT 32.4 (L) 01/08/2017   MCV 83.9 01/08/2017   PLT 197 01/08/2017      Component Value Date/Time   NA 137 01/08/2017 0705   K 3.5 01/08/2017 0705   CL 109 01/08/2017 0705   CO2 23 01/08/2017 0705   GLUCOSE 111 (H) 01/08/2017 0705   BUN 7 01/08/2017 0705   CREATININE 0.65 01/08/2017 0705   CALCIUM 8.3 (L) 01/08/2017 0705   PROT 7.3 01/06/2017 1150   ALBUMIN 4.3 01/06/2017 1150   AST 26 01/06/2017 1150   ALT 20 01/06/2017 1150   ALKPHOS 61  01/06/2017 1150   BILITOT 0.9 01/06/2017 1150   GFRNONAA >60 01/08/2017 0705   GFRAA >60 01/08/2017 0705   Lab Results  Component Value Date   CHOL 136 12/17/2015   HDL 46 12/17/2015   LDLCALC 66 12/17/2015   TRIG 119 12/17/2015   CHOLHDL 3.0 12/17/2015   Lab Results  Component Value Date   HGBA1C 6.1 (H) 12/17/2015    ASSESSMENT AND PLAN 83 y.o. Caucasian female with PMH of atrial fibrillation not on AC, hypertension and hyperlipidemia admitted on 12/16/15 for bilateral MCA and ACA multifocal punctate infarcts, consistent with cardioembolic likely due to afib not on AC. MRA showed incidental 4-5 mm ACOM aneurysm. CUS and TTE unremarkable. LDL 66 and A1C 6.1. She was on Xarelto in the past but d/c due to GIB. She was put on eliquis this time and continued on crestor 5mg . So far no GIB or other side effects.Stent placement for aneurysm by Dr. Estanislado Pandy on 12/30/2016.  The patient  is a current patient of Dr. Erlinda Hong  who is out of the office today . This note is sent to the work in doctor.     PLANContinue eliquis and crestor for stroke prevention Continue to follow up with Dr. Einar Gip for afib management Continue follow up with  Dr. Estanislado Pandy regarding aneurysm management following stent placement.   Continue to follow up with your primary care physician Dr. Shelia Media for stroke risk factor modification. Recommend maintain blood pressure goal <130/80, diabetes with hemoglobin A1c goal below 7.0% and lipids with LDL cholesterol goal below 70 mg/dL.  Will discharge from stroke clinic  Dennie Bible, [facility], [facility], [facility] [address], [place], [place] [address] [phone]

## 2017-03-09 NOTE — Progress Notes (Signed)
I agree with the above plan 

## 2017-04-16 ENCOUNTER — Telehealth: Payer: Self-pay | Admitting: Neurology

## 2017-04-16 DIAGNOSIS — H8110 Benign paroxysmal vertigo, unspecified ear: Secondary | ICD-10-CM | POA: Insufficient documentation

## 2017-04-16 NOTE — Telephone Encounter (Signed)
Pt son in law did not return the phone call. I called pt back and got her daughter's number. Although her daughter did not pick up the phone but later she called back.   I talked with daughter lucy today over the phone and told her that I think pt is experiencing BPPV. And I encourage the daughter who is a Therapist, sports to study some BPPV maneuvers from internet or google and teach pt tonight, or lucy can ask around physical therapist in the hospital and learn those maneuver and teach her mom tonight. I told daughter the maneuver is much effective than meclizine. I will put in PT referral for pt so that she will get formal training soon. If pt started to have HA, N/V, vision changes, mental changes, weakness or numbness, she needs to go to nearest ER or call 911. Daughter expressed understanding and appreciation.   Rosalin Hawking, MD PhD Stroke Neurology 04/16/2017 2:43 PM  Orders Placed This Encounter  Procedures  . Ambulatory referral to Physical Therapy    Referral Priority:   Urgent    Referral Type:   Physical Medicine    Referral Reason:   Specialty Services Required    Requested Specialty:   Physical Therapy    Number of Visits Requested:   1

## 2017-04-16 NOTE — Addendum Note (Signed)
Addended by: Rosalin Hawking on: 04/16/2017 02:45 PM   Modules accepted: Orders

## 2017-04-16 NOTE — Telephone Encounter (Signed)
Pt called today for dizziness. She stated that she had aneurysm repair procedure with Dr. Estanislado Pandy in 12/2016 and she had some dizziness for 2 days after the procedure. Since then it was good. However, for the last 2 days, she started to have dizziness on lying down or getting up from bed. She stated that if last night she lay down, she felt dizziness, rooming spinning. Then she got up, took meclizine. Dizziness gradually getting better. At middle of night, she got up to bathroom and she again felt dizzy and not able to walk. This morning, she got up from bed, again dizzy, took medication better. Lunch time, she lay down to the couch and felt dizziness again. The dizziness was "not bad" but she worries about her aneurysm. She denies any HA, LOC, weakness or numbness, N/V. She is able to have normal activity in between the episodes.   I told her that her symptoms more likely BPPV instead of CNS issues. She has no access to see PT this afternoon. However, her daughter is Therapist, sports working in [place] and pt agrees that I can contact her daughter for some maneuver training and daughter will teach her tonight.   I called pt son-in-law, Luretha Rued, for pt daughter Roena Malady) number but Jenny Reichmann did not answer the call. I left message for Jenny Reichmann to call back.   Rosalin Hawking, MD PhD Stroke Neurology 04/16/2017 1:49 PM

## 2017-06-22 DIAGNOSIS — E559 Vitamin D deficiency, unspecified: Secondary | ICD-10-CM | POA: Diagnosis not present

## 2017-06-22 DIAGNOSIS — E7800 Pure hypercholesterolemia, unspecified: Secondary | ICD-10-CM | POA: Diagnosis not present

## 2017-06-28 ENCOUNTER — Encounter: Payer: Self-pay | Admitting: Physical Therapy

## 2017-06-28 ENCOUNTER — Ambulatory Visit: Payer: Medicare Other | Attending: Neurology | Admitting: Physical Therapy

## 2017-06-28 DIAGNOSIS — R2681 Unsteadiness on feet: Secondary | ICD-10-CM | POA: Diagnosis not present

## 2017-06-28 DIAGNOSIS — R42 Dizziness and giddiness: Secondary | ICD-10-CM | POA: Diagnosis not present

## 2017-06-28 NOTE — Patient Instructions (Signed)
AROM: Neck Flexion    Bend head forward. Hold __5__ seconds. Repeat __10__ times per set. Do __1__ sets per session. Do _2__ sessions per day.  http://orth.exer.us/299     Copyright  VHI. All rights reserved.  AROM: Neck Rotation    Turn head slowly to look over one shoulder, then the other. Hold each position __3AROM: Lateral Neck Flexion    Slowly tilt head toward one shoulder, then the other. Hold each position _3___ seconds. Repeat __10__ times per set. Do    1___ sets per session. Do __2__ sessions per day.  http://orth.exer.us/297   Copyright  VHI. All rights reserved. HIP: Flexion / KNEE: Extension, Heel Strike - Standing    Reach leg out to take step, knee straight. Land with heel on floor, toes up. _10__ reps per set, _1__ sets per day, _5__ days per week Hold onto a support.  Copyright  VHI. All rights reserved.  HIP: Abduction - Standing    Squeeze glutes. Raise leg out and slightly back. _10__ reps per set, _1__ sets per day, __5_ days per week Hold onto a support.  Copyright  VHI. All rights reserved.  Hip Extension (Standing)    Stand with support. Squeeze pelvic floor and hold. Move right leg backward with straight knee. Hold for _3__ seconds. Relax for _5__ seconds. Repeat __10_ times. Do _2__ times a day.    Copyright  VHI. All rights reserved.   http://orth.exer.us/295   Copyright  VHI. All rights reserved.

## 2017-06-29 ENCOUNTER — Encounter: Payer: Self-pay | Admitting: Physical Therapy

## 2017-06-29 DIAGNOSIS — E7800 Pure hypercholesterolemia, unspecified: Secondary | ICD-10-CM | POA: Diagnosis not present

## 2017-06-29 DIAGNOSIS — I10 Essential (primary) hypertension: Secondary | ICD-10-CM | POA: Diagnosis not present

## 2017-06-29 DIAGNOSIS — E559 Vitamin D deficiency, unspecified: Secondary | ICD-10-CM | POA: Diagnosis not present

## 2017-06-29 DIAGNOSIS — E039 Hypothyroidism, unspecified: Secondary | ICD-10-CM | POA: Diagnosis not present

## 2017-06-29 NOTE — Therapy (Signed)
[facility] [address] [place], [place], [address] Phone: [phone]   Fax:  [phone]  Physical Therapy Evaluation  Patient Details  Name: Sara Frye MRN: [id number] Date of Birth: [birth year] Referring Provider: Dr. Rosalin Hawking   Encounter Date: 06/28/2017  PT End of Session - 06/29/17 1931    Visit Number  1    Number of Visits  4    Date for PT Re-Evaluation  07/28/17    Authorization Type  Medicare    Authorization Time Period  06-28-17 - 08-27-17    PT Start Time  6962    PT Stop Time  0931    PT Time Calculation (min)  44 min       Past Medical History:  Diagnosis Date  . Aneurysm ([place])   . Arthritis    spine  . Atrial fibrillation ([place])   . Dysrhythmia    afib  . Headache    with Eliquis  . Hypertension   . Hypothyroidism   . Maternal blood transfusion   . Neuromuscular disorder ([place])    spinal injury- "I'm unsure of walking, I'm very careful"  . Stroke [facility]) 11/2015    Past Surgical History:  Procedure Laterality Date  . ABDOMINAL HYSTERECTOMY    . APPENDECTOMY    . CARPAL TUNNEL RELEASE Bilateral   . CHOLECYSTECTOMY    . IR ANGIO INTRA EXTRACRAN SEL COM CAROTID INNOMINATE BILAT MOD SED  12/30/2016  . IR ANGIO INTRA EXTRACRAN SEL INTERNAL CAROTID UNI R MOD SED  01/07/2017  . IR ANGIO VERTEBRAL SEL SUBCLAVIAN INNOMINATE UNI R MOD SED  12/30/2016  . IR ANGIO VERTEBRAL SEL VERTEBRAL UNI L MOD SED  12/30/2016  . IR ANGIOGRAM FOLLOW UP STUDY  01/07/2017  . IR ANGIOGRAM FOLLOW UP STUDY  01/07/2017  . IR ANGIOGRAM FOLLOW UP STUDY  01/07/2017  . IR NEURO EACH ADD'L AFTER BASIC UNI RIGHT (MS)  01/07/2017  . IR RADIOLOGIST EVAL & MGMT  11/24/2016  . IR RADIOLOGIST EVAL & MGMT  01/25/2017  . IR TRANSCATH/EMBOLIZ  01/07/2017  . JOINT REPLACEMENT Bilateral    arthroscopic - knees    There were no vitals filed for this visit.   Subjective Assessment - 06/29/17 1917    Subjective  Pt states she has had  dizziness since her brain surgery in May 2018: states she has been sleeping sitting up (has adjustable bed) and reports she has not tried lying down due to fear of getting dizzy    Pertinent History  Brain aneurysm with stent implant 01-07-17 - hospitalized until 01-08-17; h/o CVA;  HTN    Patient Stated Goals  resolve the dizziness    Currently in Pain?  Yes    Pain Score  5     Pain Location  Back    Pain Orientation  Lower    Pain Descriptors / Indicators  Aching    Pain Type  Chronic pain    Pain Onset  More than a month ago    Pain Frequency  Constant    Aggravating Factors   unknown    Pain Relieving Factors  Tylenol         [facility] PT Assessment - 06/29/17 0001      Assessment   Medical Diagnosis  BPPV    Referring Provider  Dr. Rosalin Hawking    Onset Date/Surgical Date  01/07/17    Prior Therapy  none      Precautions  Precautions  None      Restrictions   Weight Bearing Restrictions  No      Balance Screen   Has the patient fallen in the past 6 months  No    Has the patient had a decrease in activity level because of a fear of falling?   No    Is the patient reluctant to leave their home because of a fear of falling?   No      Home Environment   Living Environment  Private residence    Type of Home  Apartment      Prior Function   Level of [place]  Retired      Ambulation/Gait   Ambulation/Gait  Yes    Ambulation/Gait Assistance  6: Modified independent (Device/Increase time)    Ambulation Distance (Feet)  100 Feet    Assistive device  None    Gait Pattern  Within Functional Limits    Ambulation Surface  Level;Indoor         Vestibular Assessment - 06/29/17 0001      Symptom Behavior   Type of Dizziness  Spinning    Frequency of Dizziness  none within past 2 weeks    Duration of Dizziness  seconds    Aggravating Factors  Looking up to the ceiling;Forward bending;Rolling to right;Rolling to left;Lying supine    Relieving  Factors  Head stationary      Occulomotor Exam   Occulomotor Alignment  Normal      Positional Testing   Dix-Hallpike  Dix-Hallpike Right;Dix-Hallpike Left      Dix-Hallpike Right   Dix-Hallpike Right Duration  none    Dix-Hallpike Right Symptoms  No nystagmus      Dix-Hallpike Left   Dix-Hallpike Left Duration  none    Dix-Hallpike Left Symptoms  No nystagmus         Objective measurements completed on examination: See above findings.                   PT Long Term Goals - 06/29/17 1942      PT LONG TERM GOAL #1   Title  Pt will report no vertigo with any bed mobility or transitional movements.    Time  4    Period  Weeks    Status  New    Target Date  07/28/17      PT LONG TERM GOAL #2   Title  Pt will report ability to sleep lying down without c/o vertigo.    Time  4    Period  Weeks    Status  New    Target Date  07/28/17      PT LONG TERM GOAL #3   Title  Improve FOTO score from 50/100 to >/= 60/100 to demo improvement in status.    Time  4    Period  Weeks    Status  New    Target Date  07/28/17      PT LONG TERM GOAL #4   Title  Independent in HEP for balance exercises.    Time  4    Period  Weeks    Status  New    Target Date  07/28/17             Plan - 06/29/17 1933    Clinical Impression Statement  Pt is an 83 yr old lady with symptoms which appear to be consistent with BPPV that has resolved  as of this time.  No c/o spinning vertigo reported with any positional testing; Rt and Lt Dix-Hallpike tests (-) at this time.  Pt does c/o neck stiffness and discomfort with AROM and also c.o imbalance and unsteadiness at times with gait.      History and Personal Factors relevant to plan of care:  h/o CVA April 2017:  brain aneurysm with stent implant in May 2018; BPPV in May 2018    Clinical Presentation  Evolving    Clinical Presentation due to:  h/o BPPV;    Clinical Decision Making  Moderate    Rehab Potential  Good    PT  Frequency  1x / week    PT Treatment/Interventions  [place] Repostioning;ADLs/Self Care Home Management;Therapeutic exercise;Therapeutic activities;Gait training;Balance training;Neuromuscular re-education;Patient/family education;Vestibular    PT Next Visit Plan  check HEP - cervical exs and balance exercises; reassess for BPPV prn    Consulted and Agree with Plan of Care  Patient       Patient will benefit from skilled therapeutic intervention in order to improve the following deficits and impairments:  Decreased balance, Difficulty walking, Dizziness  Visit Diagnosis: Unsteadiness on feet - Plan: PT plan of care cert/re-cert  Dizziness and giddiness - Plan: PT plan of care cert/re-cert  G-Codes - 27/25/36 1949    Functional Assessment Tool Used (Outpatient Only)  c/o dizziness with looking up; c/o unsteady gait at times    Functional Limitation  Changing and maintaining body position    Changing and Maintaining Body Position Current Status (U4403)  At least 40 percent but less than 60 percent impaired, limited or restricted    Changing and Maintaining Body Position Goal Status (K7425)  At least 1 percent but less than 20 percent impaired, limited or restricted        Problem List Patient Active Problem List   Diagnosis Date Noted  . BPPV (benign paroxysmal positional vertigo) 04/16/2017  . History of stroke 03/09/2017  . Brain aneurysm 01/07/2017  . Anterior communicating artery aneurysm 08/31/2016  . Chronic anticoagulation 04/16/2016  . Speech disturbance 12/17/2015  . Paroxysmal atrial fibrillation ([place]) 12/17/2015  . Hypertension 12/17/2015  . Hyperlipidemia 12/17/2015  . Dizziness 12/17/2015  . Stroke (cerebrum) [facility]     Donshay Lupinski, [place], PT 06/29/2017, 7:54 PM  [facility] [address] [place], [place], [address] Phone: [phone]   Fax:  [phone]  Name: BRYANNE RIQUELME MRN:  [id number] Date of Birth: [birth year]

## 2017-07-01 DIAGNOSIS — I671 Cerebral aneurysm, nonruptured: Secondary | ICD-10-CM | POA: Diagnosis not present

## 2017-07-01 DIAGNOSIS — Z0189 Encounter for other specified special examinations: Secondary | ICD-10-CM | POA: Diagnosis not present

## 2017-07-01 DIAGNOSIS — I480 Paroxysmal atrial fibrillation: Secondary | ICD-10-CM | POA: Diagnosis not present

## 2017-07-01 DIAGNOSIS — I10 Essential (primary) hypertension: Secondary | ICD-10-CM | POA: Diagnosis not present

## 2017-07-30 ENCOUNTER — Other Ambulatory Visit (HOSPITAL_COMMUNITY): Payer: Self-pay | Admitting: Interventional Radiology

## 2017-07-30 DIAGNOSIS — I729 Aneurysm of unspecified site: Secondary | ICD-10-CM

## 2017-08-02 ENCOUNTER — Encounter: Payer: Medicare Other | Admitting: Physical Therapy

## 2017-09-06 ENCOUNTER — Ambulatory Visit: Payer: Medicare Other | Attending: Neurology | Admitting: Physical Therapy

## 2017-09-06 ENCOUNTER — Encounter: Payer: Self-pay | Admitting: Physical Therapy

## 2017-09-06 DIAGNOSIS — R2681 Unsteadiness on feet: Secondary | ICD-10-CM

## 2017-09-06 DIAGNOSIS — R42 Dizziness and giddiness: Secondary | ICD-10-CM | POA: Insufficient documentation

## 2017-09-06 NOTE — Patient Instructions (Signed)
NECK TENSION: Assisted Stretch    Reach right arm around head and hold slightly above ear. Gently bring right ear toward right shoulder. Hold position for ___ breaths. Repeat with other arm. Repeat 1-_2__ times to each side. Do __2_ times per day.  Place arm out to side - hold 20-30 secs  FUNCTIONAL MOBILITY: Marching - Standing    March in place by lifting left leg up, then right. Alternate. _10__ reps per set, _1__ sets per day, _5__ days per week Hold onto a support as needed - hold on with less support to make it more challenging.

## 2017-09-06 NOTE — Therapy (Signed)
[facility] [address] [place], [place], [address] Phone: [phone]   Fax:  [phone]  Physical Therapy Treatment  Patient Details  Name: Sara Frye MRN: [id number] Date of Birth: 06-24-1929 Referring Provider: Dr. Rosalin Hawking   Encounter Date: 09/06/2017  PT End of Session - 09/07/17 2027    Visit Number  2    Number of Visits  4    Date for PT Re-Evaluation  07/28/17    Authorization Type  Medicare    Authorization Time Period  06-28-17 - 08-27-17    PT Start Time  0847    PT Stop Time  0922    PT Time Calculation (min)  35 min       Past Medical History:  Diagnosis Date  . Aneurysm ([place])   . Arthritis    spine  . Atrial fibrillation ([place])   . Dysrhythmia    afib  . Headache    with Eliquis  . Hypertension   . Hypothyroidism   . Maternal blood transfusion   . Neuromuscular disorder ([place])    spinal injury- "I'm unsure of walking, I'm very careful"  . Stroke [facility]) 11/2015    Past Surgical History:  Procedure Laterality Date  . ABDOMINAL HYSTERECTOMY    . APPENDECTOMY    . CARPAL TUNNEL RELEASE Bilateral   . CHOLECYSTECTOMY    . IR ANGIO INTRA EXTRACRAN SEL COM CAROTID INNOMINATE BILAT MOD SED  12/30/2016  . IR ANGIO INTRA EXTRACRAN SEL INTERNAL CAROTID UNI R MOD SED  01/07/2017  . IR ANGIO VERTEBRAL SEL SUBCLAVIAN INNOMINATE UNI R MOD SED  12/30/2016  . IR ANGIO VERTEBRAL SEL VERTEBRAL UNI L MOD SED  12/30/2016  . IR ANGIOGRAM FOLLOW UP STUDY  01/07/2017  . IR ANGIOGRAM FOLLOW UP STUDY  01/07/2017  . IR ANGIOGRAM FOLLOW UP STUDY  01/07/2017  . IR NEURO EACH ADD'L AFTER BASIC UNI RIGHT (MS)  01/07/2017  . IR RADIOLOGIST EVAL & MGMT  11/24/2016  . IR RADIOLOGIST EVAL & MGMT  01/25/2017  . IR TRANSCATH/EMBOLIZ  01/07/2017  . JOINT REPLACEMENT Bilateral    arthroscopic - knees  . RADIOLOGY WITH ANESTHESIA N/A 12/30/2016   Procedure: RADIOLOGY WITH ANESTHESIA  EMBOLIZATION;  Surgeon: Luanne Bras,  MD;  Location: [facility];  Service: Radiology;  Laterality: N/A;  . RADIOLOGY WITH ANESTHESIA N/A 01/07/2017   Procedure: EMBOLIZATION;  Surgeon: Luanne Bras, MD;  Location: [facility];  Service: Radiology;  Laterality: N/A;    There were no vitals filed for this visit.  Subjective Assessment - 09/07/17 2025    Subjective  Pt reports she has only had 1 occurrence of dizziness since initial eval in Nov. - says she was folding clothes in Dec. (sometime before Christmas) and suddenly got dizzy - sat down for about an hour and took Meclizine and it went away after about an hour    Pertinent History  Brain aneurysm with stent implant 01-07-17 - hospitalized until 01-08-17; h/o CVA;  HTN    Patient Stated Goals  resolve the dizziness             TherEx;  Reviewed cervical ROM and stretches - modified lateral cervical stretch with overpressure from ipsilateral UE with  Opposite arm outstretched for incr. Stretch  Pt performed Mulligan's stretch with towel for incr. Cervical rotation ROM and stretch;  30 sec hold for Rt and Lt sides - 2 reps each  Pt performed marching and SLS at counter with  UE support for safety - these exs. Added to HEP   Reviewed cervical AROM and balance exercises given at initial eval in Nov. 2018            PT Education - 09/07/17 2026    Education provided  Yes    Education Details  added marching and SLS to HEP for balance:  added lateral cervical stretch and Mulligan's stretch for cervical rotation stretch to HEP    Person(s) Educated  Patient    Methods  Explanation;Demonstration;Handout    Comprehension  Verbalized understanding;Returned demonstration          PT Long Term Goals - 09/07/17 2029      PT LONG TERM GOAL #1   Title  Pt will report no vertigo with any bed mobility or transitional movements.    Baseline  met 09-06-17    Status  Achieved      PT LONG TERM GOAL #2   Title  Pt will report ability to sleep lying down without c/o  vertigo.    Baseline  pt states she is now sleeping lying down - has no vertigo  - met 09-06-17    Status  Achieved      PT LONG TERM GOAL #3   Title  Improve FOTO score from 50/100 to >/= 60/100 to demo improvement in status.    Baseline  score 57/100 on 09-06-17    Status  Partially Met      PT LONG TERM GOAL #4   Title  Independent in HEP for balance exercises.    Baseline  met 09-06-17    Status  Achieved              Patient will benefit from skilled therapeutic intervention in order to improve the following deficits and impairments:  Decreased balance, Difficulty walking, Dizziness  Visit Diagnosis: Unsteadiness on feet  Dizziness and giddiness     Problem List Patient Active Problem List   Diagnosis Date Noted  . BPPV (benign paroxysmal positional vertigo) 04/16/2017  . History of stroke 03/09/2017  . Brain aneurysm 01/07/2017  . Anterior communicating artery aneurysm 08/31/2016  . Chronic anticoagulation 04/16/2016  . Speech disturbance 12/17/2015  . Paroxysmal atrial fibrillation ([place]) 12/17/2015  . Hypertension 12/17/2015  . Hyperlipidemia 12/17/2015  . Dizziness 12/17/2015  . Stroke (cerebrum) ([place])     PHYSICAL THERAPY DISCHARGE SUMMARY  Visits from Start of Care:  2  Current functional level related to goals / functional outcomes: See above for progress towards goals - all goals met except the FOTO goal with pt's score improving from 50/100 to 57/100 but not to stated goal of 60/100   Pt reports no vertigo at this time  Remaining deficits: Continued decr. High level balance skills, I.e. SLS Cont. C/o cervical tightness and decr. Cervical AROM with c/o cervical discomfort   Education / Equipment: Pt has been instructed in a balance HEP and cervical ROM/stretches HEP; pt also reports walking approx. 1/2 - 3/4 mile/day with husband's arm for assistance as she declines use of cane at this time  Plan: Patient agrees to discharge.  Patient goals  were met. Patient is being discharged due to meeting the stated rehab goals.  ?????        Alda Lea, PT 09/07/2017, 8:33 PM  [facility] [address] [place], [place], [address] Phone: [phone]   Fax:  [phone]  Name: Sara Frye MRN: [id number] Date of Birth: 02-04-1929

## 2017-09-07 ENCOUNTER — Ambulatory Visit (HOSPITAL_COMMUNITY)
Admission: RE | Admit: 2017-09-07 | Discharge: 2017-09-07 | Disposition: A | Discharge status: Home / Self Care | Payer: Medicare Other | Source: Ambulatory Visit | Attending: Interventional Radiology | Admitting: Interventional Radiology

## 2017-09-07 ENCOUNTER — Encounter: Payer: Self-pay | Admitting: Physical Therapy

## 2017-09-07 ENCOUNTER — Encounter (HOSPITAL_COMMUNITY): Payer: Self-pay

## 2017-09-07 DIAGNOSIS — I6523 Occlusion and stenosis of bilateral carotid arteries: Secondary | ICD-10-CM | POA: Diagnosis not present

## 2017-09-07 DIAGNOSIS — I671 Cerebral aneurysm, nonruptured: Secondary | ICD-10-CM | POA: Insufficient documentation

## 2017-09-07 DIAGNOSIS — I729 Aneurysm of unspecified site: Secondary | ICD-10-CM

## 2017-09-07 DIAGNOSIS — I700 Atherosclerosis of aorta: Secondary | ICD-10-CM | POA: Insufficient documentation

## 2017-09-07 DIAGNOSIS — I6529 Occlusion and stenosis of unspecified carotid artery: Secondary | ICD-10-CM | POA: Diagnosis not present

## 2017-09-07 LAB — POCT I-STAT CREATININE: Creatinine, Ser: 0.9 mg/dL (ref 0.44–1.00)

## 2017-09-07 MED ORDER — IOPAMIDOL (ISOVUE-370) INJECTION 76%
50.0000 mL | Freq: Once | INTRAVENOUS | Status: AC | PRN
  Administered 2017-09-07: 50 mL via INTRAVENOUS

## 2017-09-13 ENCOUNTER — Telehealth (HOSPITAL_COMMUNITY): Payer: Self-pay

## 2017-09-13 NOTE — Telephone Encounter (Signed)
Pt agreed to f/u in 6 months with angiogram. AW 

## 2017-10-28 DIAGNOSIS — I10 Essential (primary) hypertension: Secondary | ICD-10-CM | POA: Diagnosis not present

## 2017-10-28 DIAGNOSIS — E7800 Pure hypercholesterolemia, unspecified: Secondary | ICD-10-CM | POA: Diagnosis not present

## 2017-10-28 DIAGNOSIS — I480 Paroxysmal atrial fibrillation: Secondary | ICD-10-CM | POA: Diagnosis not present

## 2017-10-28 DIAGNOSIS — I639 Cerebral infarction, unspecified: Secondary | ICD-10-CM | POA: Diagnosis not present

## 2017-10-30 DIAGNOSIS — E7800 Pure hypercholesterolemia, unspecified: Secondary | ICD-10-CM | POA: Diagnosis not present

## 2017-10-30 DIAGNOSIS — I10 Essential (primary) hypertension: Secondary | ICD-10-CM | POA: Diagnosis not present

## 2017-12-15 DIAGNOSIS — E7800 Pure hypercholesterolemia, unspecified: Secondary | ICD-10-CM | POA: Diagnosis not present

## 2017-12-15 DIAGNOSIS — E559 Vitamin D deficiency, unspecified: Secondary | ICD-10-CM | POA: Diagnosis not present

## 2017-12-15 DIAGNOSIS — E039 Hypothyroidism, unspecified: Secondary | ICD-10-CM | POA: Diagnosis not present

## 2017-12-15 DIAGNOSIS — I10 Essential (primary) hypertension: Secondary | ICD-10-CM | POA: Diagnosis not present

## 2017-12-15 DIAGNOSIS — N390 Urinary tract infection, site not specified: Secondary | ICD-10-CM | POA: Diagnosis not present

## 2017-12-22 DIAGNOSIS — Z0000 Encounter for general adult medical examination without abnormal findings: Secondary | ICD-10-CM | POA: Diagnosis not present

## 2017-12-22 DIAGNOSIS — E039 Hypothyroidism, unspecified: Secondary | ICD-10-CM | POA: Diagnosis not present

## 2017-12-22 DIAGNOSIS — I4891 Unspecified atrial fibrillation: Secondary | ICD-10-CM | POA: Diagnosis not present

## 2017-12-22 DIAGNOSIS — R3915 Urgency of urination: Secondary | ICD-10-CM | POA: Diagnosis not present

## 2017-12-22 DIAGNOSIS — Z91010 Allergy to peanuts: Secondary | ICD-10-CM | POA: Diagnosis not present

## 2017-12-22 DIAGNOSIS — H8110 Benign paroxysmal vertigo, unspecified ear: Secondary | ICD-10-CM | POA: Diagnosis not present

## 2017-12-22 DIAGNOSIS — E559 Vitamin D deficiency, unspecified: Secondary | ICD-10-CM | POA: Diagnosis not present

## 2017-12-22 DIAGNOSIS — E7800 Pure hypercholesterolemia, unspecified: Secondary | ICD-10-CM | POA: Diagnosis not present

## 2017-12-22 DIAGNOSIS — I10 Essential (primary) hypertension: Secondary | ICD-10-CM | POA: Diagnosis not present

## 2017-12-22 DIAGNOSIS — I671 Cerebral aneurysm, nonruptured: Secondary | ICD-10-CM | POA: Diagnosis not present

## 2017-12-22 DIAGNOSIS — Z6837 Body mass index (BMI) 37.0-37.9, adult: Secondary | ICD-10-CM | POA: Diagnosis not present

## 2017-12-27 ENCOUNTER — Ambulatory Visit: Payer: Medicare Other | Admitting: Physical Therapy

## 2017-12-27 DIAGNOSIS — R260 Ataxic gait: Secondary | ICD-10-CM | POA: Diagnosis not present

## 2017-12-27 DIAGNOSIS — M545 Low back pain: Secondary | ICD-10-CM | POA: Diagnosis not present

## 2017-12-27 DIAGNOSIS — R42 Dizziness and giddiness: Secondary | ICD-10-CM | POA: Diagnosis not present

## 2017-12-27 DIAGNOSIS — H8113 Benign paroxysmal vertigo, bilateral: Secondary | ICD-10-CM | POA: Diagnosis not present

## 2017-12-30 DIAGNOSIS — I10 Essential (primary) hypertension: Secondary | ICD-10-CM | POA: Diagnosis not present

## 2017-12-30 DIAGNOSIS — I671 Cerebral aneurysm, nonruptured: Secondary | ICD-10-CM | POA: Diagnosis not present

## 2017-12-30 DIAGNOSIS — Z0189 Encounter for other specified special examinations: Secondary | ICD-10-CM | POA: Diagnosis not present

## 2017-12-30 DIAGNOSIS — I480 Paroxysmal atrial fibrillation: Secondary | ICD-10-CM | POA: Diagnosis not present

## 2018-01-03 DIAGNOSIS — R42 Dizziness and giddiness: Secondary | ICD-10-CM | POA: Diagnosis not present

## 2018-01-03 DIAGNOSIS — H8113 Benign paroxysmal vertigo, bilateral: Secondary | ICD-10-CM | POA: Diagnosis not present

## 2018-01-03 DIAGNOSIS — R260 Ataxic gait: Secondary | ICD-10-CM | POA: Diagnosis not present

## 2018-01-03 DIAGNOSIS — M545 Low back pain: Secondary | ICD-10-CM | POA: Diagnosis not present

## 2018-01-04 DIAGNOSIS — I10 Essential (primary) hypertension: Secondary | ICD-10-CM | POA: Diagnosis not present

## 2018-01-04 DIAGNOSIS — I480 Paroxysmal atrial fibrillation: Secondary | ICD-10-CM | POA: Diagnosis not present

## 2018-01-04 DIAGNOSIS — I671 Cerebral aneurysm, nonruptured: Secondary | ICD-10-CM | POA: Diagnosis not present

## 2018-01-04 DIAGNOSIS — Z0189 Encounter for other specified special examinations: Secondary | ICD-10-CM | POA: Diagnosis not present

## 2018-01-07 DIAGNOSIS — H8110 Benign paroxysmal vertigo, unspecified ear: Secondary | ICD-10-CM | POA: Diagnosis not present

## 2018-01-07 DIAGNOSIS — R3915 Urgency of urination: Secondary | ICD-10-CM | POA: Diagnosis not present

## 2018-01-10 DIAGNOSIS — R260 Ataxic gait: Secondary | ICD-10-CM | POA: Diagnosis not present

## 2018-01-10 DIAGNOSIS — M545 Low back pain: Secondary | ICD-10-CM | POA: Diagnosis not present

## 2018-01-10 DIAGNOSIS — H8113 Benign paroxysmal vertigo, bilateral: Secondary | ICD-10-CM | POA: Diagnosis not present

## 2018-01-10 DIAGNOSIS — R42 Dizziness and giddiness: Secondary | ICD-10-CM | POA: Diagnosis not present

## 2018-02-17 DIAGNOSIS — E7800 Pure hypercholesterolemia, unspecified: Secondary | ICD-10-CM | POA: Diagnosis not present

## 2018-02-17 DIAGNOSIS — I10 Essential (primary) hypertension: Secondary | ICD-10-CM | POA: Diagnosis not present

## 2018-02-17 DIAGNOSIS — I6310 Cerebral infarction due to embolism of unspecified precerebral artery: Secondary | ICD-10-CM | POA: Diagnosis not present

## 2018-02-17 DIAGNOSIS — I480 Paroxysmal atrial fibrillation: Secondary | ICD-10-CM | POA: Diagnosis not present

## 2018-03-01 ENCOUNTER — Other Ambulatory Visit (HOSPITAL_COMMUNITY): Payer: Self-pay | Admitting: Interventional Radiology

## 2018-03-01 DIAGNOSIS — I729 Aneurysm of unspecified site: Secondary | ICD-10-CM

## 2018-03-07 ENCOUNTER — Other Ambulatory Visit: Payer: Self-pay | Admitting: Interventional Radiology

## 2018-03-07 ENCOUNTER — Encounter: Payer: Self-pay | Admitting: Interventional Radiology

## 2018-03-07 MED ORDER — DEXTROSE 5 % IV SOLN: 2.0000 g | Freq: Once | INTRAVENOUS | Status: AC

## 2018-03-08 ENCOUNTER — Ambulatory Visit (HOSPITAL_COMMUNITY)
Admission: RE | Admit: 2018-03-08 | Discharge: 2018-03-08 | Disposition: A | Discharge status: Home / Self Care | Payer: Medicare Other | Source: Ambulatory Visit | Attending: Interventional Radiology | Admitting: Interventional Radiology

## 2018-03-08 ENCOUNTER — Encounter (HOSPITAL_COMMUNITY): Payer: Self-pay

## 2018-03-08 ENCOUNTER — Other Ambulatory Visit (HOSPITAL_COMMUNITY): Payer: Self-pay | Admitting: Interventional Radiology

## 2018-03-08 DIAGNOSIS — Z9049 Acquired absence of other specified parts of digestive tract: Secondary | ICD-10-CM | POA: Diagnosis not present

## 2018-03-08 DIAGNOSIS — I4891 Unspecified atrial fibrillation: Secondary | ICD-10-CM | POA: Diagnosis not present

## 2018-03-08 DIAGNOSIS — Z96653 Presence of artificial knee joint, bilateral: Secondary | ICD-10-CM | POA: Insufficient documentation

## 2018-03-08 DIAGNOSIS — I729 Aneurysm of unspecified site: Secondary | ICD-10-CM

## 2018-03-08 DIAGNOSIS — Z91010 Allergy to peanuts: Secondary | ICD-10-CM | POA: Diagnosis not present

## 2018-03-08 DIAGNOSIS — Z8249 Family history of ischemic heart disease and other diseases of the circulatory system: Secondary | ICD-10-CM | POA: Diagnosis not present

## 2018-03-08 DIAGNOSIS — Z79890 Hormone replacement therapy (postmenopausal): Secondary | ICD-10-CM | POA: Diagnosis not present

## 2018-03-08 DIAGNOSIS — Z09 Encounter for follow-up examination after completed treatment for conditions other than malignant neoplasm: Secondary | ICD-10-CM | POA: Diagnosis not present

## 2018-03-08 DIAGNOSIS — I671 Cerebral aneurysm, nonruptured: Secondary | ICD-10-CM | POA: Insufficient documentation

## 2018-03-08 DIAGNOSIS — Z8673 Personal history of transient ischemic attack (TIA), and cerebral infarction without residual deficits: Secondary | ICD-10-CM | POA: Insufficient documentation

## 2018-03-08 DIAGNOSIS — Z95828 Presence of other vascular implants and grafts: Secondary | ICD-10-CM | POA: Insufficient documentation

## 2018-03-08 DIAGNOSIS — I10 Essential (primary) hypertension: Secondary | ICD-10-CM | POA: Insufficient documentation

## 2018-03-08 DIAGNOSIS — Z9889 Other specified postprocedural states: Secondary | ICD-10-CM | POA: Diagnosis not present

## 2018-03-08 DIAGNOSIS — M4690 Unspecified inflammatory spondylopathy, site unspecified: Secondary | ICD-10-CM | POA: Insufficient documentation

## 2018-03-08 DIAGNOSIS — Z79899 Other long term (current) drug therapy: Secondary | ICD-10-CM | POA: Insufficient documentation

## 2018-03-08 DIAGNOSIS — Z7901 Long term (current) use of anticoagulants: Secondary | ICD-10-CM | POA: Insufficient documentation

## 2018-03-08 DIAGNOSIS — E039 Hypothyroidism, unspecified: Secondary | ICD-10-CM | POA: Diagnosis not present

## 2018-03-08 DIAGNOSIS — Z91013 Allergy to seafood: Secondary | ICD-10-CM | POA: Insufficient documentation

## 2018-03-08 DIAGNOSIS — Z90710 Acquired absence of both cervix and uterus: Secondary | ICD-10-CM | POA: Diagnosis not present

## 2018-03-08 DIAGNOSIS — Z48812 Encounter for surgical aftercare following surgery on the circulatory system: Secondary | ICD-10-CM | POA: Diagnosis not present

## 2018-03-08 HISTORY — PX: IR ANGIO VERTEBRAL SEL VERTEBRAL BILAT MOD SED: IMG5369

## 2018-03-08 HISTORY — PX: IR ANGIO INTRA EXTRACRAN SEL COM CAROTID INNOMINATE BILAT MOD SED: IMG5360

## 2018-03-08 LAB — BASIC METABOLIC PANEL
ANION GAP: 9 (ref 5–15)
BUN: 10 mg/dL (ref 8–23)
CALCIUM: 9.4 mg/dL (ref 8.9–10.3)
CO2: 26 mmol/L (ref 22–32)
Chloride: 105 mmol/L (ref 98–111)
Creatinine, Ser: 0.83 mg/dL (ref 0.44–1.00)
GFR calc Af Amer: >60 mL/min (ref >60)
GFR calc non Af Amer: >60 mL/min (ref >60)
GLUCOSE: 100 mg/dL — AB (ref 70–99)
POTASSIUM: 3.6 mmol/L (ref 3.5–5.1)
Sodium: 140 mmol/L (ref 135–145)

## 2018-03-08 LAB — PROTIME-INR
INR: 1.08
Prothrombin Time: 13.9 seconds (ref 11.4–15.2)

## 2018-03-08 LAB — APTT: aPTT: 29 seconds (ref 24–36)

## 2018-03-08 LAB — CBC
HEMATOCRIT: 40.9 % (ref 36.0–46.0)
HEMOGLOBIN: 13.7 g/dL (ref 12.0–15.0)
MCH: 28.7 pg (ref 26.0–34.0)
MCHC: 33.5 g/dL (ref 30.0–36.0)
MCV: 85.7 fL (ref 78.0–100.0)
Platelets: 243 10*3/uL (ref 150–400)
RBC: 4.77 MIL/uL (ref 3.87–5.11)
RDW: 13.5 % (ref 11.5–15.5)
WBC: 6.3 10*3/uL (ref 4.0–10.5)

## 2018-03-08 LAB — POCT ACTIVATED CLOTTING TIME: Activated Clotting Time: 257 s

## 2018-03-08 MED ORDER — IOPAMIDOL (ISOVUE-300) INJECTION 61%
INTRAVENOUS | Status: AC
  Administered 2018-03-08: 15 mL
  Filled 2018-03-08: qty 50

## 2018-03-08 MED ORDER — MECLIZINE HCL 25 MG PO TABS
25.0000 mg | ORAL_TABLET | Freq: Once | ORAL | Status: AC
  Administered 2018-03-08: 25 mg via ORAL
  Filled 2018-03-08: qty 1

## 2018-03-08 MED ORDER — FENTANYL CITRATE (PF) 100 MCG/2ML IJ SOLN
INTRAMUSCULAR | Status: AC
  Filled 2018-03-08: qty 2

## 2018-03-08 MED ORDER — DIPHENHYDRAMINE HCL 50 MG/ML IJ SOLN
50.0000 mg | Freq: Once | INTRAMUSCULAR | Status: AC
  Administered 2018-03-08: 50 mg via INTRAVENOUS

## 2018-03-08 MED ORDER — HEPARIN SODIUM (PORCINE) 1000 UNIT/ML IJ SOLN
INTRAMUSCULAR | Status: AC
  Filled 2018-03-08: qty 1

## 2018-03-08 MED ORDER — FENTANYL CITRATE (PF) 100 MCG/2ML IJ SOLN
INTRAMUSCULAR | Status: AC | PRN
  Administered 2018-03-08: 25 ug via INTRAVENOUS

## 2018-03-08 MED ORDER — MECLIZINE HCL 25 MG PO TABS
25.0000 mg | ORAL_TABLET | Freq: Once | ORAL | Status: DC
  Filled 2018-03-08: qty 1

## 2018-03-08 MED ORDER — SODIUM CHLORIDE 0.9 % IV SOLN
INTRAVENOUS | Status: AC
  Administered 2018-03-08: 11:00:00 via INTRAVENOUS

## 2018-03-08 MED ORDER — MIDAZOLAM HCL 2 MG/2ML IJ SOLN
INTRAMUSCULAR | Status: AC
  Filled 2018-03-08: qty 2

## 2018-03-08 MED ORDER — LIDOCAINE HCL 1 % IJ SOLN
INTRAMUSCULAR | Status: AC | PRN
  Administered 2018-03-08: 15 mL

## 2018-03-08 MED ORDER — LIDOCAINE HCL 1 % IJ SOLN
INTRAMUSCULAR | Status: AC
  Filled 2018-03-08: qty 20

## 2018-03-08 MED ORDER — DIPHENHYDRAMINE HCL 50 MG/ML IJ SOLN
INTRAMUSCULAR | Status: AC
  Filled 2018-03-08: qty 1

## 2018-03-08 MED ORDER — HEPARIN SODIUM (PORCINE) 1000 UNIT/ML IJ SOLN
INTRAMUSCULAR | Status: AC | PRN
  Administered 2018-03-08: 500 [IU] via INTRAVENOUS
  Administered 2018-03-08: 1000 [IU] via INTRAVENOUS

## 2018-03-08 MED ORDER — SODIUM CHLORIDE 0.9 % IV SOLN
INTRAVENOUS | Status: DC
  Administered 2018-03-08: 08:00:00 via INTRAVENOUS

## 2018-03-08 MED ORDER — IOHEXOL 300 MG/ML  SOLN
150.0000 mL | Freq: Once | INTRAMUSCULAR | Status: AC | PRN
  Administered 2018-03-08: 75 mL via INTRA_ARTERIAL

## 2018-03-08 NOTE — Progress Notes (Signed)
Supervising Physician: Luanne Bras  Patient Status:  [facility] OP  Chief Complaint:  Dizziness   Subjective:  Follow up cerebral arteriogram to recheck ACOM aneurysm embolization Performed in IR this am No complications In [place] pt is now complaining of dizziness Started about noon today Meclizine called in per Dr Estanislado Pandy--- has helped some but dizziness still apparent Pt states this is common for her after laying flat for some time This occurred even after last arteriogram that she remembers and confirmed by daughter who is a Marine scientist. Pt sees a Physical Therapist for this complaint--- he puts her through some exercises and dizziness goes away. She denies N/V Denies headache Denies speech or vision changes Denies tingling; numbness She is Using all 4s with = strength  Allergies: Peanut-containing drug products and Shellfish allergy  Medications: Prior to Admission medications   Medication Sig Start Date End Date Taking? Authorizing Provider  apixaban (ELIQUIS) 5 MG TABS tablet Take 5 mg by mouth 2 (two) times daily.   Yes [provider]  Biotin 5000 MCG TABS Take 1 tablet by mouth daily.   Yes [provider]  cholecalciferol (VITAMIN D) 1000 units tablet Take 1,000 Units by mouth daily.   Yes [provider]  hydrochlorothiazide (HYDRODIURIL) 25 MG tablet Take 25 mg by mouth daily.   Yes [provider]  levothyroxine (SYNTHROID, LEVOTHROID) 25 MCG tablet Take 25 mcg by mouth daily. 02/07/16  Yes [provider]  metoprolol succinate (TOPROL-XL) 25 MG 24 hr tablet Take 25 mg by mouth daily.   Yes [provider]  mirabegron ER (MYRBETRIQ) 25 MG TB24 tablet Take 25 mg by mouth daily.   Yes [provider]  rosuvastatin (CRESTOR) 5 MG tablet Take 1 tablet (5 mg total) by mouth daily at 6 PM. Patient taking differently: Take 2.5 mg by mouth at bedtime.  12/18/15  Yes Reyne Dumas, MD  meclizine (ANTIVERT) 25 MG  tablet Take 12.5 mg by mouth 3 (three) times daily as needed for dizziness.    [provider]     Vital Signs: BP (!) 158/81   Pulse (!) 54   Temp 97.6 F (36.4 C) (Oral)   Resp 14   Ht 5' (1.524 m)   Wt 190 lb (86.2 kg)   SpO2 99%   BMI 37.11 kg/m   Physical Exam  Constitutional: She is oriented to person, place, and time.  Face is symmetrical Smile = Tongue midline  HENT:  Head: Atraumatic.  Eyes: EOM are normal.  Neck: Neck supple.  Cardiovascular: Normal rate and regular rhythm.  Pulmonary/Chest: Effort normal and breath sounds normal.  Abdominal: Soft. Bowel sounds are normal.  Musculoskeletal: Normal range of motion.  Moves all 4s = strength and sensation   Neurological: She is alert and oriented to person, place, and time.  Skin: Skin is warm and dry.  Rt groin site is clean and dry NT no bleeding No hematoma Rt foot pulses 2+  Psychiatric: She has a normal mood and affect. Her behavior is normal. Judgment and thought content normal.  Nursing note and vitals reviewed.   Imaging: No results found.  Labs:  CBC: Recent Labs    03/08/18 0710  WBC 6.3  HGB 13.7  HCT 40.9  PLT 243    COAGS: Recent Labs    03/08/18 0710  INR 1.08  APTT 29    BMP: Recent Labs    09/07/17 1514 03/08/18 0710  NA  --  140  K  --  3.6  CL  --  105  CO2  --  26  GLUCOSE  --  100*  BUN  --  10  CALCIUM  --  9.4  CREATININE 0.90 0.83  GFRNONAA  --  >60  GFRAA  --  >60    LIVER FUNCTION TESTS: No results for input(s): BILITOT, AST, ALT, ALKPHOS, PROT, ALBUMIN in the last 8760 hours.  Assessment and Plan:  Previous ACOM artery aneurysm embolization 12/2016 Recheck arteriogram today shows obliteration of aneurysm Pt is having some dizziness post procedure probably related to lying flat which is common for pt. Pt intends to go to Phys Therapist now for evaluation and exercises to help with same. Pt and daughter know to call IR if dizziness does  not improve or if new symptoms arise We will call pt in 24 - 48 hrs to check on her. Agreeable to plan DC home now  Electronically Signed: Nataley Bahri A, PA-C 03/08/2018, 3:00 PM   I spent a total of 25 Minutes at the the patient's bedside AND on the patient's hospital floor or unit, greater than 50% of which was counseling/coordinating care for cerebral arteriogram

## 2018-03-08 NOTE — Progress Notes (Signed)
Jannifer Franklin, PA in to see pt. No new orders noted.

## 2018-03-08 NOTE — Procedures (Signed)
S/P 4 vessel cerebral arteriogram. RT CFA approach. Findings. 1.Obliterated RT ACA ACOM region aneurysm

## 2018-03-08 NOTE — Sedation Documentation (Signed)
Per Estanislado Pandy, MD, no versed to be given at this time.

## 2018-03-08 NOTE — Progress Notes (Signed)
C/o dizziness Antivert given as ordered.

## 2018-03-08 NOTE — Progress Notes (Signed)
Sitting up in recliner continues to c/o dizziness. Daughter at bedside. Jannifer Franklin, PA called and informed states she will be over to see her.

## 2018-03-08 NOTE — Sedation Documentation (Signed)
MD aware of ACT level.

## 2018-03-08 NOTE — Progress Notes (Signed)
Assist up to ambulate c/o dizziness assist to chair to recline. Vs taken. States "I feel better sitting"

## 2018-03-08 NOTE — H&P (Signed)
Chief Complaint: Patient was seen in consultation today for cerebral arteriogram  Referring Physician(s): Dr Lavera Guise  Supervising Physician: Luanne Bras  Patient Status: [facility] - Out-pt  History of Present Illness: Sara Frye is a 84 y.o. female   Known to [place] Previous embolization anterior communicating artery aneurysm 12/2016 CTA follow 08/2017:  IMPRESSION: 1. Status post stent assisted coiling of anterior communicating artery aneurysm without residual aneurysm filling. 2. No intracranial arterial occlusion or high-grade stenosis. 3. Mild bilateral carotid bifurcation atherosclerotic calcification without hemodynamically significant stenosis. 4.  Aortic Atherosclerosis (ICD10-I70.0).  Pt denies headaches Denies N/V Denies vision or speech changes Here today for follow up arteriogram   Past Medical History:  Diagnosis Date  . Aneurysm ([place])   . Arthritis    spine  . Atrial fibrillation ([place])   . Dysrhythmia    afib  . Headache    with Eliquis  . Hypertension   . Hypothyroidism   . Maternal blood transfusion   . Neuromuscular disorder ([place])    spinal injury- "I'm unsure of walking, I'm very careful"  . Stroke [facility]) 11/2015    Past Surgical History:  Procedure Laterality Date  . ABDOMINAL HYSTERECTOMY    . APPENDECTOMY    . CARPAL TUNNEL RELEASE Bilateral   . CHOLECYSTECTOMY    . IR ANGIO INTRA EXTRACRAN SEL COM CAROTID INNOMINATE BILAT MOD SED  12/30/2016  . IR ANGIO INTRA EXTRACRAN SEL INTERNAL CAROTID UNI R MOD SED  01/07/2017  . IR ANGIO VERTEBRAL SEL SUBCLAVIAN INNOMINATE UNI R MOD SED  12/30/2016  . IR ANGIO VERTEBRAL SEL VERTEBRAL UNI L MOD SED  12/30/2016  . IR ANGIOGRAM FOLLOW UP STUDY  01/07/2017  . IR ANGIOGRAM FOLLOW UP STUDY  01/07/2017  . IR ANGIOGRAM FOLLOW UP STUDY  01/07/2017  . IR NEURO EACH ADD'L AFTER BASIC UNI RIGHT (MS)  01/07/2017  . IR RADIOLOGIST EVAL & MGMT  11/24/2016  . IR RADIOLOGIST EVAL & MGMT  01/25/2017  . IR  TRANSCATH/EMBOLIZ  01/07/2017  . JOINT REPLACEMENT Bilateral    arthroscopic - knees  . RADIOLOGY WITH ANESTHESIA N/A 12/30/2016   Procedure: RADIOLOGY WITH ANESTHESIA  EMBOLIZATION;  Surgeon: Luanne Bras, MD;  Location: [place];  Service: Radiology;  Laterality: N/A;  . RADIOLOGY WITH ANESTHESIA N/A 01/07/2017   Procedure: EMBOLIZATION;  Surgeon: Luanne Bras, MD;  Location: [place];  Service: Radiology;  Laterality: N/A;    Allergies: Peanut-containing drug products and Shellfish allergy  Medications: Prior to Admission medications   Medication Sig Start Date End Date Taking? Authorizing Provider  apixaban (ELIQUIS) 5 MG TABS tablet Take 5 mg by mouth 2 (two) times daily.   Yes [provider]  Biotin 5000 MCG TABS Take 1 tablet by mouth daily.   Yes [provider]  cholecalciferol (VITAMIN D) 1000 units tablet Take 1,000 Units by mouth daily.   Yes [provider]  hydrochlorothiazide (HYDRODIURIL) 25 MG tablet Take 25 mg by mouth daily.   Yes [provider]  levothyroxine (SYNTHROID, LEVOTHROID) 25 MCG tablet Take 25 mcg by mouth daily. 02/07/16  Yes [provider]  metoprolol succinate (TOPROL-XL) 25 MG 24 hr tablet Take 25 mg by mouth daily.   Yes [provider]  mirabegron ER (MYRBETRIQ) 25 MG TB24 tablet Take 25 mg by mouth daily.   Yes [provider]  rosuvastatin (CRESTOR) 5 MG tablet Take 1 tablet (5 mg total) by mouth daily at 6 PM. Patient taking differently: Take 2.5  mg by mouth at bedtime.  12/18/15  Yes Reyne Dumas, MD  meclizine (ANTIVERT) 25 MG tablet Take 12.5 mg by mouth 3 (three) times daily as needed for dizziness.    [provider]     Family History  Problem Relation Age of Onset  . Heart failure Brother        deceased age 79  . Cancer Brother     Social History   Socioeconomic History  . Marital status: Married    Spouse name: Not on file  . Number of children: 2  .  Years of education: Not on file  . Highest education level: Not on file  Occupational History  . Not on file  Social Needs  . Financial resource strain: Not on file  . Food insecurity:    Worry: Not on file    Inability: Not on file  . Transportation needs:    Medical: Not on file    Non-medical: Not on file  Tobacco Use  . Smoking status: Never Smoker  . Smokeless tobacco: Never Used  Substance and Sexual Activity  . Alcohol use: No  . Drug use: No  . Sexual activity: Not on file  Lifestyle  . Physical activity:    Days per week: Not on file    Minutes per session: Not on file  . Stress: Not on file  Relationships  . Social connections:    Talks on phone: Not on file    Gets together: Not on file    Attends religious service: Not on file    Active member of club or organization: Not on file    Attends meetings of clubs or organizations: Not on file    Relationship status: Not on file  Other Topics Concern  . Not on file  Social History Narrative   Lives with sig other    Review of Systems: A 12 point ROS discussed and pertinent positives are indicated in the HPI above.  All other systems are negative.  Review of Systems  Constitutional: Negative for activity change, fatigue and fever.  HENT: Negative for tinnitus.   Eyes: Negative for visual disturbance.  Respiratory: Negative for cough and shortness of breath.   Cardiovascular: Negative for chest pain.  Gastrointestinal: Negative for abdominal pain.  Musculoskeletal: Negative for back pain.       Unsteady at times-- uses cane on occasion  Neurological: Negative for dizziness, tremors, seizures, syncope, facial asymmetry, speech difficulty, weakness, light-headedness, numbness and headaches.  Psychiatric/Behavioral: Negative for behavioral problems and confusion.    Vital Signs: BP (!) 124/55   Pulse (!) 56   Temp 97.6 F (36.4 C) (Oral)   Resp 18   Ht 5' (1.524 m)   Wt 190 lb (86.2 kg)   SpO2 94%   BMI  37.11 kg/m   Physical Exam  Constitutional: She is oriented to person, place, and time. She appears well-nourished.  HENT:  Head: Atraumatic.  Eyes: EOM are normal.  Neck: Neck supple.  Cardiovascular: Normal rate, regular rhythm and normal heart sounds.  Pulmonary/Chest: Effort normal and breath sounds normal. She has no wheezes.  Abdominal: Soft. Bowel sounds are normal. There is no tenderness.  Musculoskeletal: Normal range of motion.  Neurological: She is alert and oriented to person, place, and time.  Skin: Skin is warm and dry.  Psychiatric: She has a normal mood and affect. Her behavior is normal. Judgment and thought content normal.  Nursing note and vitals reviewed.   Imaging:  No results found.  Labs:  CBC: Recent Labs    03/08/18 0710  WBC 6.3  HGB 13.7  HCT 40.9  PLT 243    COAGS: Recent Labs    03/08/18 0710  INR 1.08  APTT 29    BMP: Recent Labs    09/07/17 1514 03/08/18 0710  NA  --  140  K  --  3.6  CL  --  105  CO2  --  26  GLUCOSE  --  100*  BUN  --  10  CALCIUM  --  9.4  CREATININE 0.90 0.83  GFRNONAA  --  >60  GFRAA  --  >60    LIVER FUNCTION TESTS: No results for input(s): BILITOT, AST, ALT, ALKPHOS, PROT, ALBUMIN in the last 8760 hours.  TUMOR MARKERS: No results for input(s): AFPTM, CEA, CA199, CHROMGRNA in the last 8760 hours.  Assessment and Plan:  Previous anterior communicating artery aneurysm embolization 12/2016 Doing well No complaints Here for follow up arteriogram Risks and benefits of cerebral angiogram with intervention were discussed with the patient including, but not limited to bleeding, infection, vascular injury, contrast induced renal failure, stroke or even death.  This interventional procedure involves the use of X-rays and because of the nature of the planned procedure, it is possible that we will have prolonged use of X-ray fluoroscopy.  Potential radiation risks to you include (but are not limited  to) the following: - A slightly elevated risk for cancer  several years later in life. This risk is typically less than 0.5% percent. This risk is low in comparison to the normal incidence of human cancer, which is 33% for women and 50% for men according to the [employer]. - Radiation induced injury can include skin redness, resembling a rash, tissue breakdown / ulcers and hair loss (which can be temporary or permanent).   The likelihood of either of these occurring depends on the difficulty of the procedure and whether you are sensitive to radiation due to previous procedures, disease, or genetic conditions.   IF your procedure requires a prolonged use of radiation, you will be notified and given written instructions for further action.  It is your responsibility to monitor the irradiated area for the 2 weeks following the procedure and to notify your physician if you are concerned that you have suffered a radiation induced injury.    All of the patient's questions were answered, patient is agreeable to proceed.  Consent signed and in chart.  Thank you for this interesting consult.  I greatly enjoyed meeting Rosamary P Surprenant and look forward to participating in their care.  A copy of this report was sent to the requesting provider on this date.  Electronically Signed: Lavonia Drafts, PA-C 03/08/2018, 8:27 AM   I spent a total of  30 Minutes   in face to face in clinical consultation, greater than 50% of which was counseling/coordinating care for cerebral arteriogram

## 2018-03-08 NOTE — Sedation Documentation (Addendum)
Nasal cannula removal order per Estanislado Pandy, MD. MD understands patient cannot receive supplemental oxygen and ETCO2 can not be monitored without device.  ETCO2 will remain off for remainder of case.

## 2018-03-08 NOTE — Sedation Documentation (Signed)
Patient has discoloration and slight swelling in R foot. Patient also has no feeling in feet. Pulse not palpable in R foot(pedal only). Patient stated this is her normal.

## 2018-03-08 NOTE — Discharge Instructions (Addendum)
Cerebral Angiogram, Care After °Refer to this sheet in the next few weeks. These instructions provide you with information on caring for yourself after your procedure. Your health care provider may also give you more specific instructions. Your treatment has been planned according to current medical practices, but problems sometimes occur. Call your health care provider if you have any problems or questions after your procedure. °What can I expect after the procedure? °After your procedure, it is typical to have the following: °· Bruising at the catheter insertion site that usually fades within 1-2 weeks. °· Blood collecting in the tissue (hematoma) that may be painful to the touch. It should usually decrease in size and tenderness within 1-2 weeks. °· A mild headache. ° °Follow these instructions at home: °· Take medicines only as directed by your health care provider. °· You may shower 24-48 hours after the procedure or as directed by your health care provider. Remove the bandage (dressing) and gently wash the site with plain soap and water. Pat the area dry with a clean towel. Do not rub the site, because this may cause bleeding. °· Do not take baths, swim, or use a hot tub until your health care provider approves. °· Check your insertion site every day for redness, swelling, or drainage. °· Do not apply powder or lotion to the site. °· Do not lift over 10 lb (4.5 kg) for 5 days after your procedure or as directed by your health care provider. °· Ask your health care provider when it is okay to: °? Return to work or school. °? Resume usual physical activities or sports. °? Resume sexual activity. °· Do not drive home if you are discharged the same day as the procedure. Have someone else drive you. °· You may drive 24 hours after the procedure unless otherwise instructed by your health care provider. °· Do not operate machinery or power tools for 24 hours after the procedure or as directed by your health care  provider. °· If your procedure was done as an outpatient procedure, which means that you went home the same day as your procedure, a responsible adult should be with you for the first 24 hours after you arrive home. °· Keep all follow-up visits as directed by your health care provider. This is important. °Contact a health care provider if: °· You have a fever. °· You have chills. °· You have increased bleeding from the catheter insertion site. Hold pressure on the site. °Get help right away if: °· You have vision changes or loss of vision. °· You have numbness or weakness on one side of your body. °· You have difficulty talking, or you have slurred speech or cannot speak (aphasia). °· You feel confused or have difficulty remembering. °· You have unusual pain at the catheter insertion site. °· You have redness, warmth, or swelling at the catheter insertion site. °· You have drainage (other than a small amount of blood on the dressing) from the catheter insertion site. °· The catheter insertion site is bleeding, and the bleeding does not stop after 30 minutes of holding steady pressure on the site. °These symptoms may represent a serious problem that is an emergency. Do not wait to see if the symptoms will go away. Get medical help right away. Call your local emergency services (911 in [place] Do not drive yourself to the hospital. °This information is not intended to replace advice given to you by your health care provider. Make sure you discuss any questions   you have with your health care provider. °Document Released: 12/25/2013 Document Revised: 01/16/2016 Document Reviewed: 08/23/2013 °Elsevier Interactive Patient Education © 2017 [employer] °Moderate Conscious Sedation, Adult, Care After °These instructions provide you with information about caring for yourself after your procedure. Your health care provider may also give you more specific instructions. Your treatment has been planned according to current  medical practices, but problems sometimes occur. Call your health care provider if you have any problems or questions after your procedure. °What can I expect after the procedure? °After your procedure, it is common: °· To feel sleepy for several hours. °· To feel clumsy and have poor balance for several hours. °· To have poor judgment for several hours. °· To vomit if you eat too soon. ° °Follow these instructions at home: °For at least 24 hours after the procedure: ° °· Do not: °? Participate in activities where you could fall or become injured. °? Drive. °? Use heavy machinery. °? Drink alcohol. °? Take sleeping pills or medicines that cause drowsiness. °? Make important decisions or sign legal documents. °? Take care of children on your own. °· Rest. °Eating and drinking °· Follow the diet recommended by your health care provider. °· If you vomit: °? Drink water, juice, or soup when you can drink without vomiting. °? Make sure you have little or no nausea before eating solid foods. °General instructions °· Have a responsible adult stay with you until you are awake and alert. °· Take over-the-counter and prescription medicines only as told by your health care provider. °· If you smoke, do not smoke without supervision. °· Keep all follow-up visits as told by your health care provider. This is important. °Contact a health care provider if: °· You keep feeling nauseous or you keep vomiting. °· You feel light-headed. °· You develop a rash. °· You have a fever. °Get help right away if: °· You have trouble breathing. °This information is not intended to replace advice given to you by your health care provider. Make sure you discuss any questions you have with your health care provider. °Document Released: 05/31/2013 Document Revised: 01/13/2016 Document Reviewed: 11/30/2015 °Elsevier Interactive Patient Education © 2018 [employer] ° °

## 2018-03-09 ENCOUNTER — Telehealth: Payer: Self-pay | Admitting: Radiology

## 2018-03-09 ENCOUNTER — Encounter (HOSPITAL_COMMUNITY): Payer: Self-pay | Admitting: Interventional Radiology

## 2018-03-09 NOTE — Progress Notes (Signed)
Called pt today to check on her. She had complained of dizziness after procedure yesterday. Had no accompanying neurologic symptoms  She has NO dizziness this am She does have an appt with PT this afternoon which often time helps with these known intermittent dizzy spells  She appreciates our call.

## 2018-03-10 ENCOUNTER — Encounter (HOSPITAL_COMMUNITY): Payer: Self-pay

## 2018-04-28 ENCOUNTER — Encounter (HOSPITAL_COMMUNITY): Payer: Self-pay

## 2018-04-28 ENCOUNTER — Other Ambulatory Visit (HOSPITAL_COMMUNITY): Payer: Self-pay | Admitting: Interventional Radiology

## 2018-04-28 DIAGNOSIS — I729 Aneurysm of unspecified site: Secondary | ICD-10-CM

## 2018-04-28 NOTE — Addendum Note (Signed)
Encounter addended by: Riley Churches on: 04/28/2018 11:26 AM  Actions taken: Imaging Exam ended, Charge Capture section accepted

## 2018-04-28 NOTE — Addendum Note (Signed)
Encounter addended by: Riley Churches on: 04/28/2018 11:25 AM  Actions taken: Order list changed, Imaging Exam ended

## 2018-07-11 DIAGNOSIS — R600 Localized edema: Secondary | ICD-10-CM | POA: Diagnosis not present

## 2018-07-11 DIAGNOSIS — I480 Paroxysmal atrial fibrillation: Secondary | ICD-10-CM | POA: Diagnosis not present

## 2018-07-11 DIAGNOSIS — I10 Essential (primary) hypertension: Secondary | ICD-10-CM | POA: Diagnosis not present

## 2018-07-11 DIAGNOSIS — I671 Cerebral aneurysm, nonruptured: Secondary | ICD-10-CM | POA: Diagnosis not present

## 2018-07-19 DIAGNOSIS — R600 Localized edema: Secondary | ICD-10-CM | POA: Diagnosis not present

## 2018-07-19 DIAGNOSIS — M25571 Pain in right ankle and joints of right foot: Secondary | ICD-10-CM | POA: Diagnosis not present

## 2018-09-28 ENCOUNTER — Other Ambulatory Visit: Payer: Self-pay | Admitting: Cardiology

## 2018-09-28 DIAGNOSIS — E785 Hyperlipidemia, unspecified: Secondary | ICD-10-CM

## 2018-09-28 MED ORDER — ROSUVASTATIN CALCIUM 5 MG PO TABS: 2.5000 mg | ORAL_TABLET | Freq: Every day | ORAL | 1 refills | Status: AC

## 2018-09-28 NOTE — Progress Notes (Unsigned)
Patient requested Refill for 2.5 mg as she has not been able to cut 5 mg in half.  Prescriptions and.  Patient was here with her husband today for his visit.

## 2018-10-01 ENCOUNTER — Other Ambulatory Visit: Payer: Self-pay | Admitting: Cardiology

## 2018-10-11 ENCOUNTER — Ambulatory Visit (INDEPENDENT_AMBULATORY_CARE_PROVIDER_SITE_OTHER): Payer: Medicare Other | Admitting: Podiatry

## 2018-10-11 ENCOUNTER — Encounter: Payer: Self-pay | Admitting: Podiatry

## 2018-10-11 VITALS — BP 156/79 | HR 64

## 2018-10-11 DIAGNOSIS — D689 Coagulation defect, unspecified: Secondary | ICD-10-CM | POA: Diagnosis not present

## 2018-10-11 DIAGNOSIS — M79675 Pain in left toe(s): Secondary | ICD-10-CM

## 2018-10-11 DIAGNOSIS — B351 Tinea unguium: Secondary | ICD-10-CM | POA: Diagnosis not present

## 2018-10-11 DIAGNOSIS — M79674 Pain in right toe(s): Secondary | ICD-10-CM | POA: Diagnosis not present

## 2018-10-11 NOTE — Progress Notes (Signed)

## 2018-10-13 DIAGNOSIS — B029 Zoster without complications: Secondary | ICD-10-CM | POA: Diagnosis not present

## 2018-10-20 DIAGNOSIS — B029 Zoster without complications: Secondary | ICD-10-CM | POA: Diagnosis not present

## 2018-10-27 DIAGNOSIS — B029 Zoster without complications: Secondary | ICD-10-CM | POA: Diagnosis not present

## 2018-11-03 DIAGNOSIS — B029 Zoster without complications: Secondary | ICD-10-CM | POA: Diagnosis not present

## 2018-11-05 ENCOUNTER — Other Ambulatory Visit: Payer: Self-pay | Admitting: Cardiology

## 2018-11-07 ENCOUNTER — Other Ambulatory Visit: Payer: Self-pay | Admitting: Cardiology

## 2018-11-11 DIAGNOSIS — Z23 Encounter for immunization: Secondary | ICD-10-CM | POA: Diagnosis not present

## 2018-11-27 ENCOUNTER — Other Ambulatory Visit: Payer: Self-pay | Admitting: Cardiology

## 2018-12-22 DIAGNOSIS — E039 Hypothyroidism, unspecified: Secondary | ICD-10-CM | POA: Diagnosis not present

## 2018-12-22 DIAGNOSIS — N390 Urinary tract infection, site not specified: Secondary | ICD-10-CM | POA: Diagnosis not present

## 2018-12-22 DIAGNOSIS — I10 Essential (primary) hypertension: Secondary | ICD-10-CM | POA: Diagnosis not present

## 2018-12-27 DIAGNOSIS — Z23 Encounter for immunization: Secondary | ICD-10-CM | POA: Diagnosis not present

## 2018-12-27 DIAGNOSIS — R3915 Urgency of urination: Secondary | ICD-10-CM | POA: Diagnosis not present

## 2018-12-27 DIAGNOSIS — I4891 Unspecified atrial fibrillation: Secondary | ICD-10-CM | POA: Diagnosis not present

## 2018-12-27 DIAGNOSIS — I10 Essential (primary) hypertension: Secondary | ICD-10-CM | POA: Diagnosis not present

## 2018-12-27 DIAGNOSIS — E559 Vitamin D deficiency, unspecified: Secondary | ICD-10-CM | POA: Diagnosis not present

## 2018-12-27 DIAGNOSIS — E039 Hypothyroidism, unspecified: Secondary | ICD-10-CM | POA: Diagnosis not present

## 2018-12-27 DIAGNOSIS — B0229 Other postherpetic nervous system involvement: Secondary | ICD-10-CM | POA: Diagnosis not present

## 2018-12-27 DIAGNOSIS — Z8673 Personal history of transient ischemic attack (TIA), and cerebral infarction without residual deficits: Secondary | ICD-10-CM | POA: Diagnosis not present

## 2019-01-01 ENCOUNTER — Encounter: Payer: Self-pay | Admitting: Cardiology

## 2019-01-01 NOTE — Progress Notes (Signed)
Virtual Visit via Telephone Note: Patient unable to use video assisted device.  This visit type was conducted due to national recommendations for restrictions regarding the COVID-19 Pandemic (e.g. social distancing).  This format is felt to be most appropriate for this patient at this time.  All issues noted in this document were discussed and addressed.  No physical exam was performed.  The patient has consented to conduct a Telehealth visit and understands insurance will be billed.   I connected with@, on 01/02/19 at  by TELEPHONE and verified that I am speaking with the correct person using two identifiers.   I discussed the limitations of evaluation and management by telemedicine and the availability of in person appointments. The patient expressed understanding and agreed to proceed.   I have discussed with patient regarding the safety during COVID Pandemic and steps and precautions to be taken including social distancing, frequent hand wash and use of detergent soap, gels with the patient. I asked the patient to avoid touching mouth, nose, eyes, ears with the hands. I encouraged regular walking around the neighborhood and exercise and regular diet, as long as social distancing can be maintained.  Primary Physician/Referring:  Deland Pretty, MD  Patient ID: Sara Frye, female    DOB: [birth year], 85 y.o.   MRN: [id number]  Chief Complaint  Patient presents with  . Atrial Fibrillation    6 month f/u    HPI: Sara Frye  is a 85 y.o. female  with  paroxysmal Afib, bilateral MCA and ACA multifocal punctate infarcts, after which she was started on anticaogulation on 12/18/15. She was also found to have cerebral aneurysm for which she underwent endovascular obliteration of anterior communicating artery aneurysm with stent assisted coiling by Dr. Estanislado Pandy on 01/07/17. She is here for 6 month follow up today. PMH significant for hypertension, mild hyperlipidemia and hypothyroidism.   Denies chest pain, tightness, palpitations, or syncope.NO bleeding diathesis on Eliquis. No further leg edema since being on Maxzide. Still has Herpes rash on the back but improving. Had labs done at PCP and labs stable.    Past Medical History:  Diagnosis Date  . Arthritis    spine  . Atrial fibrillation ([place])   . Brain aneurysm 01/07/2017  . Dysrhythmia    afib  . H/O cerebral aneurysm repair ACA S/P Coiling by Dr. Estanislado Pandy 01/07/17 01/07/2017  . Headache    with Eliquis  . Hypertension   . Hypothyroidism   . Maternal blood transfusion   . Neuromuscular disorder ([place])    spinal injury- "I'm unsure of walking, I'm very careful"  . Shingles   . Stroke [facility]) 11/2015    Past Surgical History:  Procedure Laterality Date  . ABDOMINAL HYSTERECTOMY    . APPENDECTOMY    . CARPAL TUNNEL RELEASE Bilateral   . CHOLECYSTECTOMY    . IR ANGIO INTRA EXTRACRAN SEL COM CAROTID INNOMINATE BILAT MOD SED  12/30/2016  . IR ANGIO INTRA EXTRACRAN SEL COM CAROTID INNOMINATE BILAT MOD SED  03/08/2018  . IR ANGIO INTRA EXTRACRAN SEL INTERNAL CAROTID UNI R MOD SED  01/07/2017  . IR ANGIO VERTEBRAL SEL SUBCLAVIAN INNOMINATE UNI R MOD SED  12/30/2016  . IR ANGIO VERTEBRAL SEL VERTEBRAL BILAT MOD SED  03/08/2018  . IR ANGIO VERTEBRAL SEL VERTEBRAL UNI L MOD SED  12/30/2016  . IR ANGIOGRAM FOLLOW UP STUDY  01/07/2017  . IR ANGIOGRAM FOLLOW UP STUDY  01/07/2017  . IR ANGIOGRAM FOLLOW UP STUDY  01/07/2017  .  IR NEURO EACH ADD'L AFTER BASIC UNI RIGHT (MS)  01/07/2017  . IR RADIOLOGIST EVAL & MGMT  11/24/2016  . IR RADIOLOGIST EVAL & MGMT  01/25/2017  . IR TRANSCATH/EMBOLIZ  01/07/2017  . JOINT REPLACEMENT Bilateral    arthroscopic - knees  . RADIOLOGY WITH ANESTHESIA N/A 12/30/2016   Procedure: RADIOLOGY WITH ANESTHESIA  EMBOLIZATION;  Surgeon: Luanne Bras, MD;  Location: [place];  Service: Radiology;  Laterality: N/A;  . RADIOLOGY WITH ANESTHESIA N/A 01/07/2017   Procedure: EMBOLIZATION;  Surgeon: Luanne Bras,  MD;  Location: [place];  Service: Radiology;  Laterality: N/A;    Social History   Socioeconomic History  . Marital status: Married    Spouse name: Not on file  . Number of children: 2  . Years of education: Not on file  . Highest education level: Not on file  Occupational History  . Not on file  Social Needs  . Financial resource strain: Not on file  . Food insecurity:    Worry: Not on file    Inability: Not on file  . Transportation needs:    Medical: Not on file    Non-medical: Not on file  Tobacco Use  . Smoking status: Never Smoker  . Smokeless tobacco: Never Used  Substance and Sexual Activity  . Alcohol use: No  . Drug use: No  . Sexual activity: Not on file  Lifestyle  . Physical activity:    Days per week: Not on file    Minutes per session: Not on file  . Stress: Not on file  Relationships  . Social connections:    Talks on phone: Not on file    Gets together: Not on file    Attends religious service: Not on file    Active member of club or organization: Not on file    Attends meetings of clubs or organizations: Not on file    Relationship status: Not on file  . Intimate partner violence:    Fear of current or ex partner: Not on file    Emotionally abused: Not on file    Physically abused: Not on file    Forced sexual activity: Not on file  Other Topics Concern  . Not on file  Social History Narrative   Lives with sig other    Review of Systems  Constitution: Negative for chills, decreased appetite, malaise/fatigue and weight gain.  Cardiovascular: Positive for dyspnea on exertion (stable). Negative for leg swelling and syncope.  Endocrine: Negative for cold intolerance.  Hematologic/Lymphatic: Does not bruise/bleed easily.  Skin: Positive for rash (left back area shingles past 2 months, pain and rash getting better).  Musculoskeletal: Negative for joint swelling.  Gastrointestinal: Negative for abdominal pain, anorexia and change in bowel habit.   Neurological: Positive for vertigo. Negative for headaches and light-headedness.  Psychiatric/Behavioral: Negative for depression and substance abuse.  All other systems reviewed and are negative.     Objective  Blood pressure 137/78, pulse 69, height 5' (1.524 m), weight 193 lb (87.5 kg). Body mass index is 37.69 kg/m. Physical exam not performed or limited due to virtual visit.  Tel encounter.   Please see exam details from prior visit is as below.    Physical Exam  Constitutional: She appears well-developed. No distress.  Moderately obese  HENT:  Head: Atraumatic.  Eyes: Conjunctivae are normal.  Neck: Neck supple. No JVD present. No thyromegaly present.  Cardiovascular: Normal rate, regular rhythm, normal heart sounds and intact distal pulses. Exam reveals  no gallop.  No murmur heard. Pulmonary/Chest: Effort normal and breath sounds normal.  Abdominal: Soft. Bowel sounds are normal.  Musculoskeletal: Normal range of motion.  Neurological: She is alert.  Skin: Skin is warm and dry.  Psychiatric: She has a normal mood and affect.   Radiology: No results found.  Laboratory examination:   07/12/2018: Creatinine 0.84, EGFR 63/72, potassium 4.2, CMP normal.  CBC normal.  12/15/2017: CBC normal.  Creatinine 0.89, EGFR 59, potassium 4.1, CMP normal.  Cholesterol 133, triglycerides 75, HDL 61, LDL 57.  TSH 3.15.  Vitamin D 41.7.  CMP Latest Ref Rng & Units 03/08/2018 09/07/2017 01/08/2017  Glucose 70 - 99 mg/dL 100(H) - 111(H)  BUN 8 - 23 mg/dL 10 - 7  Creatinine 0.44 - 1.00 mg/dL 0.83 0.90 0.65  Sodium 135 - 145 mmol/L 140 - 137  Potassium 3.5 - 5.1 mmol/L 3.6 - 3.5  Chloride 98 - 111 mmol/L 105 - 109  CO2 22 - 32 mmol/L 26 - 23  Calcium 8.9 - 10.3 mg/dL 9.4 - 8.3(L)  Total Protein 6.5 - 8.1 g/dL - - -  Total Bilirubin 0.3 - 1.2 mg/dL - - -  Alkaline Phos 38 - 126 U/L - - -  AST 15 - 41 U/L - - -  ALT 14 - 54 U/L - - -   CBC Latest Ref Rng & Units 03/08/2018 01/08/2017  01/06/2017  WBC 4.0 - 10.5 K/uL 6.3 8.8 7.9  Hemoglobin 12.0 - 15.0 g/dL 13.7 11.4(L) 14.4  Hematocrit 36.0 - 46.0 % 40.9 32.4(L) 41.9  Platelets 150 - 400 K/uL 243 197 284   Lipid Panel     Component Value Date/Time   CHOL 136 12/17/2015 0436   TRIG 119 12/17/2015 0436   HDL 46 12/17/2015 0436   CHOLHDL 3.0 12/17/2015 0436   VLDL 24 12/17/2015 0436   LDLCALC 66 12/17/2015 0436   HEMOGLOBIN A1C Lab Results  Component Value Date   HGBA1C 6.1 (H) 12/17/2015   MPG 128 12/17/2015   TSH No results for input(s): TSH in the last 8760 hours.  PRN Meds:. Medications Discontinued During This Encounter  Medication Reason  . UNABLE TO FIND Error   Current Meds  Medication Sig  . Biotin 5000 MCG TABS Take 1 tablet by mouth daily.  . cholecalciferol (VITAMIN D) 1000 units tablet Take 1,000 Units by mouth daily.  Marland Kitchen ELIQUIS 5 MG TABS tablet TAKE 1 TABLET BY MOUTH TWICE DAILY  . furosemide (LASIX) 20 MG tablet TAKE 1 TABLET BY MOUTH DAILY AS NEEDED FOR LEG SWELLING (Patient taking differently: Take 20 mg by mouth daily. )  . gabapentin (NEURONTIN) 100 MG capsule Take 100 mg by mouth 2 (two) times daily.   . hydrochlorothiazide (HYDRODIURIL) 25 MG tablet Take 25 mg by mouth daily.  Marland Kitchen levothyroxine (SYNTHROID, LEVOTHROID) 25 MCG tablet Take 25 mcg by mouth daily.  . meclizine (ANTIVERT) 25 MG tablet Take 12.5 mg by mouth 3 (three) times daily as needed for dizziness.  . metoprolol succinate (TOPROL-XL) 25 MG 24 hr tablet Take 12.5 mg by mouth daily.   . mirabegron ER (MYRBETRIQ) 25 MG TB24 tablet Take 25 mg by mouth daily.  . potassium chloride (K-DUR,KLOR-CON) 10 MEQ tablet TAKE 2 TABLETS BY MOUTH DAILY WITH FUROSEMIDE AS NEEDED  . rosuvastatin (CRESTOR) 5 MG tablet Take 0.5 tablets (2.5 mg total) by mouth at bedtime.  . traMADol (ULTRAM) 50 MG tablet TK 1 T PO Q 6 HOURS PRN    Cardiac  Studies:   Echo 12/17/2015 - at [facility]- Moderate LVH, EF-60-65 percent.  Grade 1 diastolic  dysfunction with elevated LV filling pressure.  Mitral annulus calcification.  Severely dilated LA, trace MR.  Status post endovascular obliteration of anterior communicating artery aneurysm with stent assisted coiling by Dr. Estanislado Pandy on 01/07/2017.  Assessment   Paroxysmal atrial fibrillation (HCC) CHA2DS2-VASc score- 6, with yearly risk of stroke-9.8%.  Mild hyperlipidemia  Essential hypertension  Hypercholesteremia  History of CVA (cerebrovascular accident) 12/18/15. ACA aneurysm repair by coiling 01/07/17.  EKG 07/11/2018:  EKG normal sinus rhythm at rate of 59 bpm, leftward axis, diffuse inferior and lateral T wave inversion, LVH with repolarization, cannot exclude ischemia. No significant change from EKG 12/30/2017: Normal sinus rhythm at rate of 73 bpm.  Recommendations:   Mrs. Sara Frye is a 85 y/o Caucasian female with paroxysmal Afib, bilateral MCA and ACA multifocal punctate infarcts, after which she was started on anticaogulation on 12/18/15.  She was also found to have cerebral aneurysm for which she underwent endovascular obliteration of anterior communicating artery aneurysm with stent assisted coiling by Dr. Estanislado Pandy on 01/07/17. She is here for 6 month follow up today.    Has herpetic rash on her back which is improving, pain also improved on gabapentin.  Recent labs, CBC, renal function normal.  Blood pressure is well controlled.  Lipids are being managed by her PCP.  No changes were done by me today, she has had complete resolution of leg edema since being back on the diuretics for hypertension.  I would like to see her back in 3 months as this was a virtual telephone only visit.  Adrian Prows, MD, [facility] 01/02/2019, 10:32 AM [facility]. [place] Pager: [phone] Office: [phone] If no answer Cell [phone]

## 2019-01-02 ENCOUNTER — Ambulatory Visit (INDEPENDENT_AMBULATORY_CARE_PROVIDER_SITE_OTHER): Payer: Medicare Other | Admitting: Cardiology

## 2019-01-02 ENCOUNTER — Encounter: Payer: Self-pay | Admitting: Cardiology

## 2019-01-02 ENCOUNTER — Other Ambulatory Visit: Payer: Self-pay

## 2019-01-02 VITALS — BP 137/78 | HR 69 | Ht 60.0 in | Wt 193.0 lb

## 2019-01-02 DIAGNOSIS — Z8673 Personal history of transient ischemic attack (TIA), and cerebral infarction without residual deficits: Secondary | ICD-10-CM

## 2019-01-02 DIAGNOSIS — E785 Hyperlipidemia, unspecified: Secondary | ICD-10-CM | POA: Diagnosis not present

## 2019-01-02 DIAGNOSIS — I10 Essential (primary) hypertension: Secondary | ICD-10-CM | POA: Diagnosis not present

## 2019-01-02 DIAGNOSIS — E7800 Pure hypercholesterolemia, unspecified: Secondary | ICD-10-CM | POA: Diagnosis not present

## 2019-01-02 DIAGNOSIS — I480 Paroxysmal atrial fibrillation: Secondary | ICD-10-CM

## 2019-01-11 ENCOUNTER — Ambulatory Visit: Payer: Medicare Other | Admitting: Podiatry

## 2019-03-29 ENCOUNTER — Other Ambulatory Visit (HOSPITAL_COMMUNITY): Payer: Self-pay | Admitting: Interventional Radiology

## 2019-03-29 ENCOUNTER — Other Ambulatory Visit: Payer: Self-pay

## 2019-03-29 DIAGNOSIS — Z20822 Contact with and (suspected) exposure to covid-19: Secondary | ICD-10-CM

## 2019-03-29 DIAGNOSIS — I671 Cerebral aneurysm, nonruptured: Secondary | ICD-10-CM

## 2019-03-29 DIAGNOSIS — R6889 Other general symptoms and signs: Secondary | ICD-10-CM | POA: Diagnosis not present

## 2019-03-31 LAB — NOVEL CORONAVIRUS, NAA: SARS-CoV-2, NAA: NOT DETECTED

## 2019-04-07 ENCOUNTER — Encounter: Payer: Self-pay | Admitting: Cardiology

## 2019-04-07 ENCOUNTER — Other Ambulatory Visit: Payer: Self-pay

## 2019-04-07 ENCOUNTER — Ambulatory Visit (INDEPENDENT_AMBULATORY_CARE_PROVIDER_SITE_OTHER): Payer: Medicare Other | Admitting: Cardiology

## 2019-04-07 VITALS — BP 132/69 | HR 68 | Ht 60.0 in | Wt 185.5 lb

## 2019-04-07 DIAGNOSIS — I10 Essential (primary) hypertension: Secondary | ICD-10-CM

## 2019-04-07 DIAGNOSIS — E785 Hyperlipidemia, unspecified: Secondary | ICD-10-CM | POA: Diagnosis not present

## 2019-04-07 DIAGNOSIS — I480 Paroxysmal atrial fibrillation: Secondary | ICD-10-CM | POA: Diagnosis not present

## 2019-04-07 DIAGNOSIS — R252 Cramp and spasm: Secondary | ICD-10-CM

## 2019-04-07 NOTE — Progress Notes (Signed)
Primary Physician/Referring:  Deland Pretty, MD  Patient ID: Sara Frye, female    DOB: 1929/08/16, 85 y.o.   MRN: [id number]  Chief Complaint  Patient presents with  . Atrial Fibrillation    3 MONTH F/U    HPI: Sara Frye  is a 85 y.o. female  with  paroxysmal Afib, bilateral MCA and ACA multifocal punctate infarcts, after which she was started on anticaogulation on 12/18/15. She was also found to have cerebral aneurysm for which she underwent endovascular obliteration of anterior communicating artery aneurysm with stent assisted coiling by Dr. Estanislado Pandy on 01/07/17. She is here for 6 month follow up today. PMH significant for hypertension, mild hyperlipidemia and hypothyroidism.   Except for mild cramping in bilateral especially at night no other specific complaints today, continues to have post herpetic neuralgia in the left chest area and occasional hemorrhoidal bleed.  Past Medical History:  Diagnosis Date  . Arthritis    spine  . Atrial fibrillation ([place])   . Brain aneurysm 01/07/2017  . Dysrhythmia    afib  . H/O cerebral aneurysm repair ACA S/P Coiling by Dr. Estanislado Pandy 01/07/17 01/07/2017  . Headache    with Eliquis  . Hypertension   . Hypothyroidism   . Maternal blood transfusion   . Neuromuscular disorder ([place])    spinal injury- "I'm unsure of walking, I'm very careful"  . Shingles   . Stroke [facility]) 11/2015    Past Surgical History:  Procedure Laterality Date  . ABDOMINAL HYSTERECTOMY    . APPENDECTOMY    . CARPAL TUNNEL RELEASE Bilateral   . CHOLECYSTECTOMY    . IR ANGIO INTRA EXTRACRAN SEL COM CAROTID INNOMINATE BILAT MOD SED  12/30/2016  . IR ANGIO INTRA EXTRACRAN SEL COM CAROTID INNOMINATE BILAT MOD SED  03/08/2018  . IR ANGIO INTRA EXTRACRAN SEL INTERNAL CAROTID UNI R MOD SED  01/07/2017  . IR ANGIO VERTEBRAL SEL SUBCLAVIAN INNOMINATE UNI R MOD SED  12/30/2016  . IR ANGIO VERTEBRAL SEL VERTEBRAL BILAT MOD SED  03/08/2018  . IR ANGIO VERTEBRAL SEL  VERTEBRAL UNI L MOD SED  12/30/2016  . IR ANGIOGRAM FOLLOW UP STUDY  01/07/2017  . IR ANGIOGRAM FOLLOW UP STUDY  01/07/2017  . IR ANGIOGRAM FOLLOW UP STUDY  01/07/2017  . IR NEURO EACH ADD'L AFTER BASIC UNI RIGHT (MS)  01/07/2017  . IR RADIOLOGIST EVAL & MGMT  11/24/2016  . IR RADIOLOGIST EVAL & MGMT  01/25/2017  . IR TRANSCATH/EMBOLIZ  01/07/2017  . JOINT REPLACEMENT Bilateral    arthroscopic - knees  . RADIOLOGY WITH ANESTHESIA N/A 12/30/2016   Procedure: RADIOLOGY WITH ANESTHESIA  EMBOLIZATION;  Surgeon: Luanne Bras, MD;  Location: [place];  Service: Radiology;  Laterality: N/A;  . RADIOLOGY WITH ANESTHESIA N/A 01/07/2017   Procedure: EMBOLIZATION;  Surgeon: Luanne Bras, MD;  Location: [place];  Service: Radiology;  Laterality: N/A;    Social History   Socioeconomic History  . Marital status: Married    Spouse name: Not on file  . Number of children: 2  . Years of education: Not on file  . Highest education level: Not on file  Occupational History  . Not on file  Social Needs  . Financial resource strain: Not on file  . Food insecurity    Worry: Not on file    Inability: Not on file  . Transportation needs    Medical: Not on file    Non-medical: Not on file  Tobacco Use  .  Smoking status: Never Smoker  . Smokeless tobacco: Never Used  Substance and Sexual Activity  . Alcohol use: No  . Drug use: No  . Sexual activity: Not on file  Lifestyle  . Physical activity    Days per week: Not on file    Minutes per session: Not on file  . Stress: Not on file  Relationships  . Social Herbalist on phone: Not on file    Gets together: Not on file    Attends religious service: Not on file    Active member of club or organization: Not on file    Attends meetings of clubs or organizations: Not on file    Relationship status: Not on file  . Intimate partner violence    Fear of current or ex partner: Not on file    Emotionally abused: Not on file    Physically abused:  Not on file    Forced sexual activity: Not on file  Other Topics Concern  . Not on file  Social History Narrative   Lives with sig other    Review of Systems  Constitution: Negative for chills, decreased appetite, malaise/fatigue and weight gain.  Cardiovascular: Positive for dyspnea on exertion (stable). Negative for leg swelling and syncope.  Endocrine: Negative for cold intolerance.  Hematologic/Lymphatic: Does not bruise/bleed easily.  Skin: Negative for rash.  Musculoskeletal: Positive for joint pain and muscle cramps (bilateral calves). Negative for joint swelling.  Gastrointestinal: Positive for hemorrhoids. Negative for abdominal pain, anorexia and change in bowel habit.  Neurological: Positive for vertigo. Negative for headaches and light-headedness.  Psychiatric/Behavioral: Negative for depression and substance abuse.  All other systems reviewed and are negative.  Objective  Blood pressure 132/69, pulse 68, height 5' (1.524 m), weight 185 lb 8 oz (84.1 kg), SpO2 95 %. Body mass index is 36.23 kg/m.   Physical Exam  Constitutional: She appears well-developed. No distress.  Moderately obese  HENT:  Head: Atraumatic.  Eyes: Conjunctivae are normal.  Neck: Neck supple. No JVD present. No thyromegaly present.  Cardiovascular: Normal rate, regular rhythm and normal heart sounds. Exam reveals no gallop.  No murmur heard. Pulses:      Carotid pulses are 2+ on the right side and 2+ on the left side.      Femoral pulses are 2+ on the right side and 2+ on the left side.      Popliteal pulses are 1+ on the right side and 1+ on the left side.       Dorsalis pedis pulses are 1+ on the right side and 1+ on the left side.       Posterior tibial pulses are 0 on the right side and 0 on the left side.  Pulmonary/Chest: Effort normal and breath sounds normal.  Abdominal: Soft. Bowel sounds are normal.  Musculoskeletal: Normal range of motion.  Neurological: She is alert.  Skin: Skin  is warm and dry.  Psychiatric: She has a normal mood and affect.   Radiology: No results found.  Laboratory examination:   07/12/2018: Creatinine 0.84, EGFR 63/72, potassium 4.2, CMP normal.  CBC normal.  12/15/2017: CBC normal.  Creatinine 0.89, EGFR 59, potassium 4.1, CMP normal.  Cholesterol 133, triglycerides 75, HDL 61, LDL 57.  TSH 3.15.  Vitamin D 41.7.  CMP Latest Ref Rng & Units 03/08/2018 09/07/2017 01/08/2017  Glucose 70 - 99 mg/dL 100(H) - 111(H)  BUN 8 - 23 mg/dL 10 - 7  Creatinine 0.44 - 1.00  mg/dL 0.83 0.90 0.65  Sodium 135 - 145 mmol/L 140 - 137  Potassium 3.5 - 5.1 mmol/L 3.6 - 3.5  Chloride 98 - 111 mmol/L 105 - 109  CO2 22 - 32 mmol/L 26 - 23  Calcium 8.9 - 10.3 mg/dL 9.4 - 8.3(L)  Total Protein 6.5 - 8.1 g/dL - - -  Total Bilirubin 0.3 - 1.2 mg/dL - - -  Alkaline Phos 38 - 126 U/L - - -  AST 15 - 41 U/L - - -  ALT 14 - 54 U/L - - -   CBC Latest Ref Rng & Units 03/08/2018 01/08/2017 01/06/2017  WBC 4.0 - 10.5 K/uL 6.3 8.8 7.9  Hemoglobin 12.0 - 15.0 g/dL 13.7 11.4(L) 14.4  Hematocrit 36.0 - 46.0 % 40.9 32.4(L) 41.9  Platelets 150 - 400 K/uL 243 197 284   Lipid Panel     Component Value Date/Time   CHOL 136 12/17/2015 0436   TRIG 119 12/17/2015 0436   HDL 46 12/17/2015 0436   CHOLHDL 3.0 12/17/2015 0436   VLDL 24 12/17/2015 0436   LDLCALC 66 12/17/2015 0436   HEMOGLOBIN A1C Lab Results  Component Value Date   HGBA1C 6.1 (H) 12/17/2015   MPG 128 12/17/2015   TSH No results for input(s): TSH in the last 8760 hours.  PRN Meds:. There are no discontinued medications. Current Meds  Medication Sig  . Biotin 5000 MCG TABS Take 1 tablet by mouth daily.  . cholecalciferol (VITAMIN D) 1000 units tablet Take 1,000 Units by mouth daily.  Marland Kitchen ELIQUIS 5 MG TABS tablet TAKE 1 TABLET BY MOUTH TWICE DAILY  . furosemide (LASIX) 20 MG tablet TAKE 1 TABLET BY MOUTH DAILY AS NEEDED FOR LEG SWELLING (Patient taking differently: Take 20 mg by mouth daily. )  . gabapentin  (NEURONTIN) 100 MG capsule Take 100 mg by mouth 2 (two) times daily.   . hydrochlorothiazide (HYDRODIURIL) 25 MG tablet Take 25 mg by mouth daily.  Marland Kitchen levothyroxine (SYNTHROID, LEVOTHROID) 25 MCG tablet Take 25 mcg by mouth daily.  . meclizine (ANTIVERT) 25 MG tablet Take 12.5 mg by mouth 3 (three) times daily as needed for dizziness.  . metoprolol succinate (TOPROL-XL) 25 MG 24 hr tablet Take 12.5 mg by mouth daily.   . mirabegron ER (MYRBETRIQ) 25 MG TB24 tablet Take 25 mg by mouth daily.  . potassium chloride (K-DUR,KLOR-CON) 10 MEQ tablet TAKE 2 TABLETS BY MOUTH DAILY WITH FUROSEMIDE AS NEEDED  . rosuvastatin (CRESTOR) 5 MG tablet Take 0.5 tablets (2.5 mg total) by mouth at bedtime.  . traMADol (ULTRAM) 50 MG tablet TK 1 T PO Q 6 HOURS PRN    Cardiac Studies:   Echo 12/17/2015 - at [facility]- Moderate LVH, EF-60-65 percent.  Grade 1 diastolic dysfunction with elevated LV filling pressure.  Mitral annulus calcification.  Severely dilated LA, trace MR.  Status post endovascular obliteration of anterior communicating artery aneurysm with stent assisted coiling by Dr. Estanislado Pandy on 01/07/2017.  Assessment     ICD-10-CM   1. Paroxysmal atrial fibrillation (HCC) CHA2DS2-VASc score- 6, with yearly risk of stroke-9.8%.  I48.0 EKG 12-Lead  2. Essential hypertension  I10   3. Mild hyperlipidemia  E78.5   4. Leg cramps  R25.2     EKG 04/07/2019: Normal sinus rhythm at rate of 60 bpm, left axis deviation, left anterior fascicular block.  LVH with repolarization normality, cannot exclude high lateral ischemia.  No significant change from  EKG 07/11/2018  Recommendations:   Mrs. Mcguinness  Sara Frye is a 85 y/o Caucasian female with paroxysmal Afib, bilateral MCA and ACA multifocal punctate infarcts, after which she was started on anticaogulation on 12/18/15.  She was also found to have cerebral aneurysm for which she underwent endovascular obliteration of anterior communicating artery aneurysm with  stent assisted coiling by Dr. Estanislado Pandy on 01/07/17. She is here for 3 month follow up today.   Patient has now developed mild cramping in her bilateral calves, suspect she probably has peripheral artery disease.  Her pulses are mildly reduced.  Advised her if she has any lifestyle limiting cramping when she walks to let me know if I can further investigate this.  She continues to hemorrhoidal bleed, advised her to follow-up with GI that was referred to previously.  Otherwise I did not make any changes to her medications, blood pressure is well controlled, lipids are under excellent control I will see her back in 6 months.  She maintains sinus rhythm with regard to atrial fibrillation.  Continue anticoagulation.  Adrian Prows, MD, [facility] 04/07/2019, 8:58 AM [facility]. [place] Pager: [phone] Office: [phone] If no answer Cell [phone]

## 2019-04-18 ENCOUNTER — Ambulatory Visit (HOSPITAL_COMMUNITY)
Admission: RE | Admit: 2019-04-18 | Discharge: 2019-04-18 | Disposition: A | Discharge status: Home / Self Care | Payer: Medicare Other | Source: Ambulatory Visit | Attending: Interventional Radiology | Admitting: Interventional Radiology

## 2019-04-18 ENCOUNTER — Ambulatory Visit (HOSPITAL_COMMUNITY): Payer: Medicare Other

## 2019-04-18 ENCOUNTER — Other Ambulatory Visit: Payer: Self-pay

## 2019-04-18 DIAGNOSIS — Z8679 Personal history of other diseases of the circulatory system: Secondary | ICD-10-CM | POA: Diagnosis not present

## 2019-04-18 DIAGNOSIS — I671 Cerebral aneurysm, nonruptured: Secondary | ICD-10-CM | POA: Diagnosis not present

## 2019-04-18 DIAGNOSIS — Z9689 Presence of other specified functional implants: Secondary | ICD-10-CM | POA: Diagnosis not present

## 2019-04-18 DIAGNOSIS — I6782 Cerebral ischemia: Secondary | ICD-10-CM | POA: Diagnosis not present

## 2019-04-18 LAB — CREATININE, SERUM
Creatinine, Ser: 0.83 mg/dL (ref 0.44–1.00)
GFR calc Af Amer: >60 mL/min (ref >60)
GFR calc non Af Amer: >60 mL/min (ref >60)

## 2019-04-18 MED ORDER — GADOBUTROL 1 MMOL/ML IV SOLN
8.0000 mL | Freq: Once | INTRAVENOUS | Status: AC | PRN
  Administered 2019-04-18: 16:00:00 8 mL via INTRAVENOUS

## 2019-04-20 ENCOUNTER — Other Ambulatory Visit: Payer: Self-pay | Admitting: Cardiology

## 2019-04-20 ENCOUNTER — Telehealth (HOSPITAL_COMMUNITY): Payer: Self-pay

## 2019-04-20 NOTE — Telephone Encounter (Signed)
Pt agreed to f/u in 1 year with mri/mra. AW  

## 2019-04-26 DIAGNOSIS — Z1212 Encounter for screening for malignant neoplasm of rectum: Secondary | ICD-10-CM | POA: Diagnosis not present

## 2019-04-26 DIAGNOSIS — Z01419 Encounter for gynecological examination (general) (routine) without abnormal findings: Secondary | ICD-10-CM | POA: Diagnosis not present

## 2019-04-26 DIAGNOSIS — B0229 Other postherpetic nervous system involvement: Secondary | ICD-10-CM | POA: Diagnosis not present

## 2019-06-02 DIAGNOSIS — Z23 Encounter for immunization: Secondary | ICD-10-CM | POA: Diagnosis not present

## 2019-07-27 ENCOUNTER — Other Ambulatory Visit: Payer: Self-pay | Admitting: Internal Medicine

## 2019-07-27 DIAGNOSIS — Z1231 Encounter for screening mammogram for malignant neoplasm of breast: Secondary | ICD-10-CM

## 2019-09-06 ENCOUNTER — Ambulatory Visit: Payer: Self-pay

## 2019-09-06 ENCOUNTER — Other Ambulatory Visit: Payer: Self-pay

## 2019-09-06 ENCOUNTER — Ambulatory Visit (INDEPENDENT_AMBULATORY_CARE_PROVIDER_SITE_OTHER): Payer: Medicare Other | Admitting: Specialist

## 2019-09-06 ENCOUNTER — Encounter: Payer: Self-pay | Admitting: Specialist

## 2019-09-06 VITALS — BP 148/82 | HR 65 | Ht 60.0 in | Wt 186.0 lb

## 2019-09-06 DIAGNOSIS — M4807 Spinal stenosis, lumbosacral region: Secondary | ICD-10-CM

## 2019-09-06 DIAGNOSIS — M545 Low back pain, unspecified: Secondary | ICD-10-CM

## 2019-09-06 DIAGNOSIS — M48062 Spinal stenosis, lumbar region with neurogenic claudication: Secondary | ICD-10-CM

## 2019-09-06 DIAGNOSIS — M4316 Spondylolisthesis, lumbar region: Secondary | ICD-10-CM | POA: Diagnosis not present

## 2019-09-06 DIAGNOSIS — M5136 Other intervertebral disc degeneration, lumbar region: Secondary | ICD-10-CM | POA: Diagnosis not present

## 2019-09-06 MED ORDER — ALPRAZOLAM 0.25 MG PO TABS
ORAL_TABLET | ORAL | 0 refills | Status: DC
Start: 2019-09-06 — End: 2020-10-23

## 2019-09-06 NOTE — Progress Notes (Addendum)
Office Visit Note   Patient: Sara Frye           Date of Birth: [birth year]           MRN: [id number] Visit Date: 09/06/2019              Requested by: Deland Pretty, MD [address] [place],  [place] [address] PCP: Deland Pretty, MD   Assessment & Plan: Visit Diagnoses:  1. Low back pain without sciatica, unspecified back pain laterality, unspecified chronicity   2. Spinal stenosis of lumbar region with neurogenic claudication   3. Spondylolisthesis, lumbar region   4. Other intervertebral disc degeneration, lumbar region   5. Spinal stenosis of lumbosacral region   6. Spondylolisthesis of lumbar region   86 year old female with increasing tendency to stoop, increasing neurogenic claudication symptoms due to leg  Weakness and numbness. Also with increasing bowel and bladder incontinence. She has history of cerebral  Stents and is on eloquis for afibrillations. Will try PT for core strengthening, LE strengthening and balance and coordination exercises. MRI of the lumbar spine due to increasing incontinence or bowel and bladder. Keep  Current meds, I do not have others to add. Discussed the cause and the treatments available including therapy. Use of ESIs and surgery. I recommend use of a cane or walker. Handicap sticker is approved   Plan: Avoid bending, stooping and avoid lifting weights greater than 10 lbs. Avoid prolong standing and walking. Avoid frequent bending and stooping  No lifting greater than 10 lbs. May use ice or moist heat for pain. Weight loss is of benefit. Handicap license is approved. Physical therapy to improve leg strength and endurance and core strengthening. MRI due to incontinence of bowel and bladder. Keggel exercises for pelvic floor strengthening. Extra tylenol max 4 per day. Tramadol for more severe pain. Fall Prevention and Home Safety Falls cause injuries and can affect all age groups. It is possible to use preventive  measures to significantly decrease the likelihood of falls. There are many simple measures which can make your home safer and prevent falls. OUTDOORS  Repair cracks and edges of walkways and driveways.  Remove high doorway thresholds.  Trim shrubbery on the main path into your home.  Have good outside lighting.  Clear walkways of tools, rocks, debris, and clutter.  Check that handrails are not broken and are securely fastened. Both sides of steps should have handrails.  Have leaves, snow, and ice cleared regularly.  Use sand or salt on walkways during winter months.  In the garage, clean up grease or oil spills. BATHROOM  Install night lights.  Install grab bars by the toilet and in the tub and shower.  Use non-skid mats or decals in the tub or shower.  Place a plastic non-slip stool in the shower to sit on, if needed.  Keep floors dry and clean up all water on the floor immediately.  Remove soap buildup in the tub or shower on a regular basis.  Secure bath mats with non-slip, double-sided rug tape.  Remove throw rugs and tripping hazards from the floors. BEDROOMS  Install night lights.  Make sure a bedside light is easy to reach.  Do not use oversized bedding.  Keep a telephone by your bedside.  Have a firm chair with side arms to use for getting dressed.  Remove throw rugs and tripping hazards from the floor. KITCHEN  Keep handles on pots and pans turned toward the center of  the stove. Use back burners when possible.  Clean up spills quickly and allow time for drying.  Avoid walking on wet floors.  Avoid hot utensils and knives.  Position shelves so they are not too high or low.  Place commonly used objects within easy reach.  If necessary, use a sturdy step stool with a grab bar when reaching.  Keep electrical cables out of the way.  Do not use floor polish or wax that makes floors slippery. If you must use wax, use non-skid floor wax.  Remove  throw rugs and tripping hazards from the floor. STAIRWAYS  Never leave objects on stairs.  Place handrails on both sides of stairways and use them. Fix any loose handrails. Make sure handrails on both sides of the stairways are as long as the stairs.  Check carpeting to make sure it is firmly attached along stairs. Make repairs to worn or loose carpet promptly.  Avoid placing throw rugs at the top or bottom of stairways, or properly secure the rug with carpet tape to prevent slippage. Get rid of throw rugs, if possible.  Have an electrician put in a light switch at the top and bottom of the stairs. OTHER FALL PREVENTION TIPS  Wear low-heel or rubber-soled shoes that are supportive and fit well. Wear closed toe shoes.  When using a stepladder, make sure it is fully opened and both spreaders are firmly locked. Do not climb a closed stepladder.  Add color or contrast paint or tape to grab bars and handrails in your home. Place contrasting color strips on first and last steps.  Learn and use mobility aids as needed. Install an electrical emergency response system.  Turn on lights to avoid dark areas. Replace light bulbs that burn out immediately. Get light switches that glow.  Arrange furniture to create clear pathways. Keep furniture in the same place.  Firmly attach carpet with non-skid or double-sided tape.  Eliminate uneven floor surfaces.  Select a carpet pattern that does not visually hide the edge of steps.  Be aware of all pets. OTHER HOME SAFETY TIPS  Set the water temperature for 120 F (48.8 C).  Keep emergency numbers on or near the telephone.  Keep smoke detectors on every level of the home and near sleeping areas. Document Released: 07/31/2002 Document Revised: 02/09/2012 Document Reviewed: 10/30/2011 [facility] Patient Information 2014 [employer].  Follow-Up Instructions: No follow-ups on file.   Orders:  Orders Placed This Encounter  Procedures  . XR  Lumbar Spine 2-3 Views  . MR Lumbar Spine w/o contrast  . Ambulatory referral to Physical Therapy   Meds ordered this encounter  Medications  . ALPRAZolam (XANAX) 0.25 MG tablet    Sig: Take one tablet po at the time of the MRI and may repeat x 1 after 30 minutes.    Dispense:  2 tablet    Refill:  0      Procedures: No procedures performed   Clinical Data: No additional findings.   Subjective: Chief Complaint  Patient presents with  . Lower Back - Pain    86 year old female with history of back pain for years and this worsened post and episode of the shigles left lower left flank and left abdomen. She was seen by the primary care and she took a antiviral medication. She has been on tylenol extra strength and sometime tramadol for pain. Takes eliquis for afib and history of a stroke Due to an aneurysm that was stented and she  was tested every 6 months to assess the aneurysm. She has pain in her lower back and causes her to stoop. Has pain with bending and there is pain with prolong sitting. She is able to walk but need help and is concern she may fall due to increasing leg weakness with the longer she Is standing. She has incontinence of bowel and bladder and this is associated with urgency and accidents. She  Takes vesicare. Takes a muscle pill for pain, She bends over with walking. She is not sleeping well, gets 3 hours  Sleep at night and that's all she needs. Grocery shopping with him her partner, using the electric cart. She walks up and down stair one at a time and has difficulty reaching the right foot more than the left and the muscles right thigh area and there is pain in the groin area with first standing and she has to wait after standing to get started.    Review of Systems  Constitutional: Negative.  Negative for activity change, appetite change, chills, diaphoresis, fatigue, fever and unexpected weight change.  HENT: Negative for congestion, dental problem,  drooling, ear discharge, ear pain, facial swelling, hearing loss, mouth sores, nosebleeds, postnasal drip, rhinorrhea, sinus pressure, sinus pain, sneezing, sore throat, tinnitus, trouble swallowing and voice change.   Eyes: Negative.  Negative for photophobia, pain, discharge, redness and itching.  Respiratory: Negative.  Negative for apnea, cough, choking, chest tightness, shortness of breath, wheezing and stridor.   Cardiovascular: Negative.  Negative for chest pain, palpitations and leg swelling.  Gastrointestinal: Negative.  Negative for abdominal distention, abdominal pain, anal bleeding, blood in stool, constipation, diarrhea, nausea, rectal pain and vomiting.  Endocrine: Negative.  Negative for cold intolerance, heat intolerance, polydipsia, polyphagia and polyuria.  Genitourinary: Positive for enuresis and urgency. Negative for difficulty urinating, dyspareunia, dysuria, flank pain, frequency, genital sores and hematuria.  Musculoskeletal: Positive for back pain, gait problem and neck stiffness. Negative for arthralgias, joint swelling, myalgias and neck pain.  Skin: Negative.  Negative for color change, pallor, rash and wound.  Allergic/Immunologic: Negative for environmental allergies and food allergies.  Neurological: Positive for dizziness and weakness. Negative for tremors, seizures, syncope, facial asymmetry, speech difficulty, light-headedness, numbness and headaches.  Hematological: Negative.  Negative for adenopathy. Does not bruise/bleed easily.  Psychiatric/Behavioral: Negative for agitation, behavioral problems, confusion, decreased concentration, dysphoric mood, hallucinations, self-injury, sleep disturbance and suicidal ideas. The patient is not nervous/anxious and is not hyperactive.      Objective: Vital Signs: BP (!) 148/82 (BP Location: Left Arm, Patient Position: Sitting)   Pulse 65   Ht 5' (1.524 m)   Wt 186 lb (84.4 kg)   BMI 36.33 kg/m   Physical Exam  Ortho  Exam  Specialty Comments:  No specialty comments available.  Imaging: No results found.   PMFS History: Patient Active Problem List   Diagnosis Date Noted  . BPPV (benign paroxysmal positional vertigo) 04/16/2017  . History of stroke 03/09/2017  . H/O cerebral aneurysm repair ACA S/P Coiling by Dr. Estanislado Pandy 01/07/17 01/07/2017  . Chronic anticoagulation 04/16/2016  . Speech disturbance 12/17/2015  . Atrial fibrillation ([place]) 12/17/2015  . Hypertension 12/17/2015  . Hyperlipidemia 12/17/2015  . Dizziness 12/17/2015  . Stroke (cerebrum) [facility])    Past Medical History:  Diagnosis Date  . Arthritis    spine  . Atrial fibrillation ([place])   . Brain aneurysm 01/07/2017  . Dysrhythmia    afib  . H/O cerebral aneurysm repair ACA S/P Coiling by  Dr. Estanislado Pandy 01/07/17 01/07/2017  . Headache    with Eliquis  . Hypertension   . Hypothyroidism   . Maternal blood transfusion   . Neuromuscular disorder ([place])    spinal injury- "I'm unsure of walking, I'm very careful"  . Shingles   . Stroke [facility]) 11/2015    Family History  Problem Relation Age of Onset  . Heart failure Brother        deceased age 88  . Cancer Brother     Past Surgical History:  Procedure Laterality Date  . ABDOMINAL HYSTERECTOMY    . APPENDECTOMY    . CARPAL TUNNEL RELEASE Bilateral   . CHOLECYSTECTOMY    . IR ANGIO INTRA EXTRACRAN SEL COM CAROTID INNOMINATE BILAT MOD SED  12/30/2016  . IR ANGIO INTRA EXTRACRAN SEL COM CAROTID INNOMINATE BILAT MOD SED  03/08/2018  . IR ANGIO INTRA EXTRACRAN SEL INTERNAL CAROTID UNI R MOD SED  01/07/2017  . IR ANGIO VERTEBRAL SEL SUBCLAVIAN INNOMINATE UNI R MOD SED  12/30/2016  . IR ANGIO VERTEBRAL SEL VERTEBRAL BILAT MOD SED  03/08/2018  . IR ANGIO VERTEBRAL SEL VERTEBRAL UNI L MOD SED  12/30/2016  . IR ANGIOGRAM FOLLOW UP STUDY  01/07/2017  . IR ANGIOGRAM FOLLOW UP STUDY  01/07/2017  . IR ANGIOGRAM FOLLOW UP STUDY  01/07/2017  . IR NEURO EACH ADD'L AFTER BASIC UNI RIGHT (MS)  01/07/2017    . IR RADIOLOGIST EVAL & MGMT  11/24/2016  . IR RADIOLOGIST EVAL & MGMT  01/25/2017  . IR TRANSCATH/EMBOLIZ  01/07/2017  . JOINT REPLACEMENT Bilateral    arthroscopic - knees  . RADIOLOGY WITH ANESTHESIA N/A 12/30/2016   Procedure: RADIOLOGY WITH ANESTHESIA  EMBOLIZATION;  Surgeon: Luanne Bras, MD;  Location: [place];  Service: Radiology;  Laterality: N/A;  . RADIOLOGY WITH ANESTHESIA N/A 01/07/2017   Procedure: EMBOLIZATION;  Surgeon: Luanne Bras, MD;  Location: [place];  Service: Radiology;  Laterality: N/A;   Social History   Occupational History  . Not on file  Tobacco Use  . Smoking status: Never Smoker  . Smokeless tobacco: Never Used  Substance and Sexual Activity  . Alcohol use: No  . Drug use: No  . Sexual activity: Not on file

## 2019-09-06 NOTE — Patient Instructions (Addendum)
Avoid bending, stooping and avoid lifting weights greater than 10 lbs. Avoid prolong standing and walking. Avoid frequent bending and stooping  No lifting greater than 10 lbs. May use ice or moist heat for pain. Weight loss is of benefit. Handicap license is approved. Physical therapy to improve leg strength and endurance and core strengthening. MRI due to incontinence of bowel and bladder. Keggel exercises for pelvic floor strengthening. Extra tylenol max 4 per day. Tramadol for more severe pain. Fall Prevention and Home Safety Falls cause injuries and can affect all age groups. It is possible to use preventive measures to significantly decrease the likelihood of falls. There are many simple measures which can make your home safer and prevent falls. OUTDOORS  Repair cracks and edges of walkways and driveways.  Remove high doorway thresholds.  Trim shrubbery on the main path into your home.  Have good outside lighting.  Clear walkways of tools, rocks, debris, and clutter.  Check that handrails are not broken and are securely fastened. Both sides of steps should have handrails.  Have leaves, snow, and ice cleared regularly.  Use sand or salt on walkways during winter months.  In the garage, clean up grease or oil spills. BATHROOM  Install night lights.  Install grab bars by the toilet and in the tub and shower.  Use non-skid mats or decals in the tub or shower.  Place a plastic non-slip stool in the shower to sit on, if needed.  Keep floors dry and clean up all water on the floor immediately.  Remove soap buildup in the tub or shower on a regular basis.  Secure bath mats with non-slip, double-sided rug tape.  Remove throw rugs and tripping hazards from the floors. BEDROOMS  Install night lights.  Make sure a bedside light is easy to reach.  Do not use oversized bedding.  Keep a telephone by your bedside.  Have a firm chair with side arms to use for getting  dressed.  Remove throw rugs and tripping hazards from the floor. KITCHEN  Keep handles on pots and pans turned toward the center of the stove. Use back burners when possible.  Clean up spills quickly and allow time for drying.  Avoid walking on wet floors.  Avoid hot utensils and knives.  Position shelves so they are not too high or low.  Place commonly used objects within easy reach.  If necessary, use a sturdy step stool with a grab bar when reaching.  Keep electrical cables out of the way.  Do not use floor polish or wax that makes floors slippery. If you must use wax, use non-skid floor wax.  Remove throw rugs and tripping hazards from the floor. STAIRWAYS  Never leave objects on stairs.  Place handrails on both sides of stairways and use them. Fix any loose handrails. Make sure handrails on both sides of the stairways are as long as the stairs.  Check carpeting to make sure it is firmly attached along stairs. Make repairs to worn or loose carpet promptly.  Avoid placing throw rugs at the top or bottom of stairways, or properly secure the rug with carpet tape to prevent slippage. Get rid of throw rugs, if possible.  Have an electrician put in a light switch at the top and bottom of the stairs. OTHER FALL PREVENTION TIPS  Wear low-heel or rubber-soled shoes that are supportive and fit well. Wear closed toe shoes.  When using a stepladder, make sure it is fully opened and both spreaders  are firmly locked. Do not climb a closed stepladder.  Add color or contrast paint or tape to grab bars and handrails in your home. Place contrasting color strips on first and last steps.  Learn and use mobility aids as needed. Install an electrical emergency response system.  Turn on lights to avoid dark areas. Replace light bulbs that burn out immediately. Get light switches that glow.  Arrange furniture to create clear pathways. Keep furniture in the same place.  Firmly attach  carpet with non-skid or double-sided tape.  Eliminate uneven floor surfaces.  Select a carpet pattern that does not visually hide the edge of steps.  Be aware of all pets. OTHER HOME SAFETY TIPS  Set the water temperature for 120 F (48.8 C).  Keep emergency numbers on or near the telephone.  Keep smoke detectors on every level of the home and near sleeping areas. Document Released: 07/31/2002 Document Revised: 02/09/2012 Document Reviewed: 10/30/2011 [facility] Patient Information 2014 [place].

## 2019-09-07 ENCOUNTER — Ambulatory Visit: Payer: Medicare Other | Attending: Specialist

## 2019-09-07 ENCOUNTER — Other Ambulatory Visit: Payer: Self-pay

## 2019-09-07 DIAGNOSIS — G8929 Other chronic pain: Secondary | ICD-10-CM | POA: Diagnosis not present

## 2019-09-07 DIAGNOSIS — R2689 Other abnormalities of gait and mobility: Secondary | ICD-10-CM | POA: Diagnosis not present

## 2019-09-07 DIAGNOSIS — M6281 Muscle weakness (generalized): Secondary | ICD-10-CM | POA: Diagnosis not present

## 2019-09-07 DIAGNOSIS — M5440 Lumbago with sciatica, unspecified side: Secondary | ICD-10-CM | POA: Diagnosis not present

## 2019-09-07 NOTE — Therapy (Signed)
[facility] [address] [place], [place], [address] Phone: [phone]   Fax:  [phone]  Physical Therapy Evaluation  Patient Details  Name: Sara Frye MRN: [id number] Date of Birth: 07-17-1929 Referring Provider (PT): Basil Dess, MD   Encounter Date: 09/07/2019  PT End of Session - 09/07/19 1135    Visit Number  1    Date for PT Re-Evaluation  11/02/19    Authorization Type  Medicare BCBS    PT Start Time  1058    PT Stop Time  1138    PT Time Calculation (min)  40 min    Activity Tolerance  Patient tolerated treatment well    Behavior During Therapy  [facility] for tasks assessed/performed       Past Medical History:  Diagnosis Date  . Arthritis    spine  . Atrial fibrillation ([place])   . Brain aneurysm 01/07/2017  . Dysrhythmia    afib  . H/O cerebral aneurysm repair ACA S/P Coiling by Dr. Estanislado Pandy 01/07/17 01/07/2017  . Headache    with Eliquis  . Hypertension   . Hypothyroidism   . Maternal blood transfusion   . Neuromuscular disorder ([place])    spinal injury- "I'm unsure of walking, I'm very careful"  . Shingles   . Stroke [facility]) 11/2015    Past Surgical History:  Procedure Laterality Date  . ABDOMINAL HYSTERECTOMY    . APPENDECTOMY    . CARPAL TUNNEL RELEASE Bilateral   . CHOLECYSTECTOMY    . IR ANGIO INTRA EXTRACRAN SEL COM CAROTID INNOMINATE BILAT MOD SED  12/30/2016  . IR ANGIO INTRA EXTRACRAN SEL COM CAROTID INNOMINATE BILAT MOD SED  03/08/2018  . IR ANGIO INTRA EXTRACRAN SEL INTERNAL CAROTID UNI R MOD SED  01/07/2017  . IR ANGIO VERTEBRAL SEL SUBCLAVIAN INNOMINATE UNI R MOD SED  12/30/2016  . IR ANGIO VERTEBRAL SEL VERTEBRAL BILAT MOD SED  03/08/2018  . IR ANGIO VERTEBRAL SEL VERTEBRAL UNI L MOD SED  12/30/2016  . IR ANGIOGRAM FOLLOW UP STUDY  01/07/2017  . IR ANGIOGRAM FOLLOW UP STUDY  01/07/2017  . IR ANGIOGRAM FOLLOW UP STUDY  01/07/2017  . IR NEURO EACH ADD'L AFTER BASIC UNI RIGHT (MS)   01/07/2017  . IR RADIOLOGIST EVAL & MGMT  11/24/2016  . IR RADIOLOGIST EVAL & MGMT  01/25/2017  . IR TRANSCATH/EMBOLIZ  01/07/2017  . JOINT REPLACEMENT Bilateral    arthroscopic - knees  . RADIOLOGY WITH ANESTHESIA N/A 12/30/2016   Procedure: RADIOLOGY WITH ANESTHESIA  EMBOLIZATION;  Surgeon: Luanne Bras, MD;  Location: [place];  Service: Radiology;  Laterality: N/A;  . RADIOLOGY WITH ANESTHESIA N/A 01/07/2017   Procedure: EMBOLIZATION;  Surgeon: Luanne Bras, MD;  Location: [place];  Service: Radiology;  Laterality: N/A;    There were no vitals filed for this visit.   Subjective Assessment - 09/07/19 1059    Subjective  Pt presents to PT with complaints of LBP and bil leg pain of a chronic nature.  Pt had shingles a couple of months ago and pain worsened at that time.  MD would like pt to come to PT to work on strength, endurance and flexibility    Pertinent History  chronic LBP with stenosis, HTN, CVA (2017), bil knee scope    Limitations  Sitting;Standing;Walking    How long can you sit comfortably?  30 minutes    How long can you stand comfortably?  30 minutes    How long can  you walk comfortably?  household distances    Diagnostic tests  MRI scheduled soon    Patient Stated Goals  reduce LBP, improve endurance and strength    Currently in Pain?  Yes    Pain Score  6     Pain Location  Back    Pain Orientation  Right;Left;Lower    Pain Descriptors / Indicators  Aching;Burning    Pain Type  Chronic pain    Pain Radiating Towards  bil legs    Pain Onset  More than a month ago    Pain Frequency  Constant    Aggravating Factors   standing an walking, sitting    Pain Relieving Factors  change of position, Tylenol         [facility] PT Assessment - 09/07/19 0001      Assessment   Medical Diagnosis  spinal stenosis of lumbar region with neurogenic claudication, spondylolisthesis, lumbar region    Referring Provider (PT)  Basil Dess, MD    Onset Date/Surgical Date  07/08/19    chronic   Next MD Visit  after MRI    Prior Therapy  none      Precautions   Precautions  Fall      Restrictions   Weight Bearing Restrictions  No      Balance Screen   Has the patient fallen in the past 6 months  No    Has the patient had a decrease in activity level because of a fear of falling?   No    Is the patient reluctant to leave their home because of a fear of falling?   No      Home Environment   Living Environment  Private residence    Living Arrangements  Spouse/significant other    Type of [place] to enter    Entrance Stairs-Number of Steps  2    [place] to live on main level with bedroom/bathroom    Home Equipment  [place] - single point;Wheelchair - manual      Prior Function   Level of Independence  Independent with household mobility with device;Independent with basic ADLs    Vocation  Retired;On disability    Leisure  leg cycle at home      Cognition   Overall Cognitive Status  Within Functional Limits for tasks assessed      Observation/Other Assessments   Focus on Therapeutic Outcomes (FOTO)   50% limitation      Posture/Postural Control   Posture/Postural Control  Postural limitations    Postural Limitations  Forward head;Rounded Shoulders;Flexed trunk      ROM / Strength   AROM / PROM / Strength  AROM;PROM;Strength      AROM   Overall AROM   Deficits    Overall AROM Comments  hip flexibility limited by 50%, lumbar A/ROM not texted due to balance issues      PROM   Overall PROM   Deficits      Strength   Overall Strength  Deficits    Overall Strength Comments  bil hip strength 4/5, 4+/5 bil knee strength      Palpation   Palpation comment  palpable tenderness over bil lumbar paraspinals and gluteals       Transfers   Transfers  Stand to Sit;Sit to Stand    Sit to Stand  With upper extremity assist;6: Modified independent (Device/Increase time)    Five time sit  to stand comments   25.90 seconds     Stand to Sit  With upper extremity assist;6: Modified independent (Device/Increase time)      Ambulation/Gait   Ambulation/Gait  Yes    Gait Pattern  Step-through pattern;Decreased stride length;Antalgic;Trunk flexed;Wide base of support                Objective measurements completed on examination: See above findings.              PT Education - 09/07/19 1135    Education Details  Access Code: [email or web address]    Person(s) Educated  Patient    Methods  Explanation;Demonstration;Handout    Comprehension  Returned demonstration;Verbalized understanding       PT Short Term Goals - 09/07/19 1056      PT SHORT TERM GOAL #1   Title  be independent in independent HEP    Time  4    Period  Weeks    Status  New    Target Date  10/05/19      PT SHORT TERM GOAL #2   Title  report a 25% reduction in LBP and bil leg pain with standing and walking    Time  4    Period  Weeks    Status  New    Target Date  10/05/19      PT SHORT TERM GOAL #3   Title  perform 5x sit to stand with UE support in < or = to 22 seconds to improve safety    Time  4    Period  Weeks    Status  New    Target Date  10/05/19      PT SHORT TERM GOAL #4   Title  report postural corrections with standing erect with houshold tasks    Time  4    Status  New    Target Date  10/05/19        PT Long Term Goals - 09/07/19 1057      PT LONG TERM GOAL #1   Title  be independent in advanced HEP    Time  8    Period  Weeks    Status  New    Target Date  11/02/19      PT LONG TERM GOAL #2   Title  reduce FOTO to < or = to 50% limitation    Baseline  --    Time  8    Period  Weeks    Target Date  11/02/19      PT LONG TERM GOAL #3   Title  report a 50% reduction in LBP and leg pain with standing and walking    Baseline  --    Time  8    Period  Weeks    Status  New    Target Date  11/02/19      PT LONG TERM GOAL #4   Title  improve LE strength to perform sit to stand with minimal UE  support required    Baseline  --    Time  8    Period  Weeks    Status  New    Target Date  11/02/19      PT LONG TERM GOAL #5   Title  perform 5x sit to stand with UE support in < or = to 20 seconds to improve safety    Time  8    Period  Weeks    Status  New    Target Date  11/02/19             Plan - 09/07/19 1202    Clinical Impression Statement  Pt presents to PT with chronic LBP and LE pain that has worsened over the past 2 months after a bout of shingles.  Pt has diagnosis of spinal stenosis of neurogenic claudication.  Pt is limited in standing and walking tolerance due to pain and endurance deficits.  Pt stands and ambulates with flexed trunk posture and wide base of support.  Pt with reduced hip flexibility and strength bilaterally.  Pt is at a falls risks as demonstrated by max UE support required with sit to stand and 5x sit to stand was 25.90 seconds.  Pt will benefit from skilled PT to improve flexibility, strength, endurance and address LBP/LE pain as needed.    Personal Factors and Comorbidities  Comorbidity 2    Comorbidities  CVA, spinal stenosis    Examination-Activity Limitations  Lift;Carry;Locomotion Level;Stairs;Stand    Examination-Participation Restrictions  Cleaning;Community Activity;Meal Prep;Shop    Stability/Clinical Decision Making  Evolving/Moderate complexity    Clinical Decision Making  Moderate    Rehab Potential  Good    PT Frequency  2x / week    PT Duration  8 weeks    PT Treatment/Interventions  ADLs/Self Care Home Management;Cryotherapy;Electrical Stimulation;Moist Heat;Gait training;Stair training;Functional mobility training;Therapeutic activities;Therapeutic exercise;Balance training;Neuromuscular re-education;Patient/family education;Manual techniques;Passive range of motion;Dry needling;Taping    PT Next Visit Plan  work on strength and endurance, review HEP, NuStep    PT Home Exercise Plan  Access Code: [email or web address]    Consulted and Agree  with Plan of Care  Patient       Patient will benefit from skilled therapeutic intervention in order to improve the following deficits and impairments:  Abnormal gait, Decreased activity tolerance, Decreased balance, Decreased endurance, Decreased mobility, Decreased strength, Decreased range of motion, Postural dysfunction, Improper body mechanics, Pain, Increased muscle spasms, Difficulty walking, Impaired flexibility  Visit Diagnosis: Chronic bilateral low back pain with sciatica, sciatica laterality unspecified - Plan: PT plan of care cert/re-cert  Muscle weakness (generalized) - Plan: PT plan of care cert/re-cert  Other abnormalities of gait and mobility - Plan: PT plan of care cert/re-cert     Problem List Patient Active Problem List   Diagnosis Date Noted  . BPPV (benign paroxysmal positional vertigo) 04/16/2017  . History of stroke 03/09/2017  . H/O cerebral aneurysm repair ACA S/P Coiling by Dr. Estanislado Pandy 01/07/17 01/07/2017  . Chronic anticoagulation 04/16/2016  . Speech disturbance 12/17/2015  . Atrial fibrillation ([place]) 12/17/2015  . Hypertension 12/17/2015  . Hyperlipidemia 12/17/2015  . Dizziness 12/17/2015  . Stroke (cerebrum) [facility])     Sigurd Sos, PT 09/07/19 12:16 PM  [facility] [address] [place], [place], [address] Phone: [phone]   Fax:  [phone]  Name: Sara Frye MRN: [id number] Date of Birth: Apr 15, 1929

## 2019-09-07 NOTE — Patient Instructions (Signed)
Access Code: [email or web address]  URL: [email or web address]  Date: 09/07/2019  Prepared by: Sigurd Sos   Exercises Seated Long Arc Quad - 10 reps - 2 sets - 5 hold - 3x daily - 7x weekly Seated March - 10 reps - 3 sets - 3x daily - 7x weekly Seated Heel Toe Raises - 10 reps - 2 sets - 3x daily - 7x weekly Seated Hamstring Stretch - 3 reps - 1 sets - 20 hold - 3x daily - 7x weekly Correct Standing Posture - 10 reps - 3 sets - 1x daily - 7x weekly

## 2019-09-12 ENCOUNTER — Other Ambulatory Visit: Payer: Self-pay

## 2019-09-12 ENCOUNTER — Ambulatory Visit: Payer: Medicare Other

## 2019-09-12 DIAGNOSIS — M6281 Muscle weakness (generalized): Secondary | ICD-10-CM | POA: Diagnosis not present

## 2019-09-12 DIAGNOSIS — M5440 Lumbago with sciatica, unspecified side: Secondary | ICD-10-CM | POA: Diagnosis not present

## 2019-09-12 DIAGNOSIS — G8929 Other chronic pain: Secondary | ICD-10-CM

## 2019-09-12 DIAGNOSIS — R2689 Other abnormalities of gait and mobility: Secondary | ICD-10-CM

## 2019-09-12 NOTE — Therapy (Signed)
[facility] [address] [place], [place], [address] Phone: [phone]   Fax:  [phone]  Physical Therapy Treatment  Patient Details  Name: Sara Frye MRN: [id number] Date of Birth: 12-26-1928 Referring Provider (PT): Basil Dess, MD   Encounter Date: 09/12/2019  PT End of Session - 09/12/19 0841    Visit Number  2    Date for PT Re-Evaluation  11/02/19    Authorization Type  Medicare BCBS    PT Start Time  0801    PT Stop Time  0841    PT Time Calculation (min)  40 min    Activity Tolerance  Patient tolerated treatment well    Behavior During Therapy  [facility] for tasks assessed/performed       Past Medical History:  Diagnosis Date  . Arthritis    spine  . Atrial fibrillation ([place])   . Brain aneurysm 01/07/2017  . Dysrhythmia    afib  . H/O cerebral aneurysm repair ACA S/P Coiling by Dr. Estanislado Pandy 01/07/17 01/07/2017  . Headache    with Eliquis  . Hypertension   . Hypothyroidism   . Maternal blood transfusion   . Neuromuscular disorder ([place])    spinal injury- "I'm unsure of walking, I'm very careful"  . Shingles   . Stroke [facility]) 11/2015    Past Surgical History:  Procedure Laterality Date  . ABDOMINAL HYSTERECTOMY    . APPENDECTOMY    . CARPAL TUNNEL RELEASE Bilateral   . CHOLECYSTECTOMY    . IR ANGIO INTRA EXTRACRAN SEL COM CAROTID INNOMINATE BILAT MOD SED  12/30/2016  . IR ANGIO INTRA EXTRACRAN SEL COM CAROTID INNOMINATE BILAT MOD SED  03/08/2018  . IR ANGIO INTRA EXTRACRAN SEL INTERNAL CAROTID UNI R MOD SED  01/07/2017  . IR ANGIO VERTEBRAL SEL SUBCLAVIAN INNOMINATE UNI R MOD SED  12/30/2016  . IR ANGIO VERTEBRAL SEL VERTEBRAL BILAT MOD SED  03/08/2018  . IR ANGIO VERTEBRAL SEL VERTEBRAL UNI L MOD SED  12/30/2016  . IR ANGIOGRAM FOLLOW UP STUDY  01/07/2017  . IR ANGIOGRAM FOLLOW UP STUDY  01/07/2017  . IR ANGIOGRAM FOLLOW UP STUDY  01/07/2017  . IR NEURO EACH ADD'L AFTER BASIC UNI RIGHT (MS)   01/07/2017  . IR RADIOLOGIST EVAL & MGMT  11/24/2016  . IR RADIOLOGIST EVAL & MGMT  01/25/2017  . IR TRANSCATH/EMBOLIZ  01/07/2017  . JOINT REPLACEMENT Bilateral    arthroscopic - knees  . RADIOLOGY WITH ANESTHESIA N/A 12/30/2016   Procedure: RADIOLOGY WITH ANESTHESIA  EMBOLIZATION;  Surgeon: Luanne Bras, MD;  Location: [place];  Service: Radiology;  Laterality: N/A;  . RADIOLOGY WITH ANESTHESIA N/A 01/07/2017   Procedure: EMBOLIZATION;  Surgeon: Luanne Bras, MD;  Location: [place];  Service: Radiology;  Laterality: N/A;    There were no vitals filed for this visit.  Subjective Assessment - 09/12/19 0810    Subjective  I slept really well last night.  I am taking Tramadol for pain at night and that is really helping.    Currently in Pain?  No/denies                       [facility] Adult PT Treatment/Exercise - 09/12/19 0001      Exercises   Exercises  Lumbar;Knee/Hip      Lumbar Exercises: Stretches   Active Hamstring Stretch  Left;Right;3 reps;20 seconds      Lumbar Exercises: Aerobic   Nustep  Level 1 x  6 minutes    PT present for status assessment     Lumbar Exercises: Standing   Heel Raises  20 reps      Knee/Hip Exercises: Standing   Heel Raises  Both;2 sets;10 reps    Functional Squat  10 reps   mini squat     Knee/Hip Exercises: Seated   Long Arc Quad  Strengthening;Both;2 sets;10 reps    Marching  Strengthening;Both;2 sets;10 reps    Abduction/Adduction   Strengthening;Both;2 sets;10 reps    Abd/Adduction Limitations  ball squeeze and abduction with yellow band               PT Short Term Goals - 09/07/19 1056      PT SHORT TERM GOAL #1   Title  be independent in independent HEP    Time  4    Period  Weeks    Status  New    Target Date  10/05/19      PT SHORT TERM GOAL #2   Title  report a 25% reduction in LBP and bil leg pain with standing and walking    Time  4    Period  Weeks    Status  New    Target Date  10/05/19       PT SHORT TERM GOAL #3   Title  perform 5x sit to stand with UE support in < or = to 22 seconds to improve safety    Time  4    Period  Weeks    Status  New    Target Date  10/05/19      PT SHORT TERM GOAL #4   Title  report postural corrections with standing erect with houshold tasks    Time  4    Status  New    Target Date  10/05/19        PT Long Term Goals - 09/07/19 1057      PT LONG TERM GOAL #1   Title  be independent in advanced HEP    Time  8    Period  Weeks    Status  New    Target Date  11/02/19      PT LONG TERM GOAL #2   Title  reduce FOTO to < or = to 50% limitation    Baseline  --    Time  8    Period  Weeks    Target Date  11/02/19      PT LONG TERM GOAL #3   Title  report a 50% reduction in LBP and leg pain with standing and walking    Baseline  --    Time  8    Period  Weeks    Status  New    Target Date  11/02/19      PT LONG TERM GOAL #4   Title  improve LE strength to perform sit to stand with minimal UE support required    Baseline  --    Time  8    Period  Weeks    Status  New    Target Date  11/02/19      PT LONG TERM GOAL #5   Title  perform 5x sit to stand with UE support in < or = to 20 seconds to improve safety    Time  8    Period  Weeks    Status  New    Target Date  11/02/19  Plan - 09/12/19 0813    Clinical Impression Statement  Pt with first time follow-up after evaluation.  PT reviewed HEP with pt including technique, reps and how to perform during the day.  Pt reported dizziness with standing with erect posture.  Pt was looking up at the ceiling while practicing this and PT provided education for neutral posture without cervical extension.  Pt demonstrated and verbalized understanding.  Pt with overall improved standing postural awareness. Pt was monitored closely due to fatigue with activity today.  Pt with chronic posture, strength and endurance deficits and will continue to benefit from skilled PT for  strength, endurance and pain management as needed.    PT Frequency  2x / week    PT Duration  8 weeks    PT Treatment/Interventions  ADLs/Self Care Home Management;Cryotherapy;Electrical Stimulation;Moist Heat;Gait training;Stair training;Functional mobility training;Therapeutic activities;Therapeutic exercise;Balance training;Neuromuscular re-education;Patient/family education;Manual techniques;Passive range of motion;Dry needling;Taping    PT Next Visit Plan  work on strength and endurance,  NuStep    PT Home Exercise Plan  Access Code: [email or web address]    Recommended Other Services  initial cert is signed    Consulted and Agree with Plan of Care  Patient       Patient will benefit from skilled therapeutic intervention in order to improve the following deficits and impairments:  Abnormal gait, Decreased activity tolerance, Decreased balance, Decreased endurance, Decreased mobility, Decreased strength, Decreased range of motion, Postural dysfunction, Improper body mechanics, Pain, Increased muscle spasms, Difficulty walking, Impaired flexibility  Visit Diagnosis: Chronic bilateral low back pain with sciatica, sciatica laterality unspecified  Muscle weakness (generalized)  Other abnormalities of gait and mobility     Problem List Patient Active Problem List   Diagnosis Date Noted  . BPPV (benign paroxysmal positional vertigo) 04/16/2017  . History of stroke 03/09/2017  . H/O cerebral aneurysm repair ACA S/P Coiling by Dr. Estanislado Pandy 01/07/17 01/07/2017  . Chronic anticoagulation 04/16/2016  . Speech disturbance 12/17/2015  . Atrial fibrillation ([place]) 12/17/2015  . Hypertension 12/17/2015  . Hyperlipidemia 12/17/2015  . Dizziness 12/17/2015  . Stroke (cerebrum) [facility])      Sigurd Sos, PT 09/12/19 8:42 AM  [facility] [address] [place], [place], [address] Phone: [phone]   Fax:  [phone]  Name: Sara Frye MRN: [id number] Date of Birth: [birth year]

## 2019-09-15 ENCOUNTER — Encounter: Payer: Self-pay | Admitting: Physical Therapy

## 2019-09-15 ENCOUNTER — Ambulatory Visit: Payer: Medicare Other | Admitting: Physical Therapy

## 2019-09-15 ENCOUNTER — Other Ambulatory Visit: Payer: Self-pay

## 2019-09-15 DIAGNOSIS — M5440 Lumbago with sciatica, unspecified side: Secondary | ICD-10-CM

## 2019-09-15 DIAGNOSIS — R2689 Other abnormalities of gait and mobility: Secondary | ICD-10-CM | POA: Diagnosis not present

## 2019-09-15 DIAGNOSIS — M6281 Muscle weakness (generalized): Secondary | ICD-10-CM

## 2019-09-15 DIAGNOSIS — G8929 Other chronic pain: Secondary | ICD-10-CM

## 2019-09-15 NOTE — Therapy (Signed)
[facility] [address] [place], [place], [address] Phone: [phone]   Fax:  [phone]  Physical Therapy Treatment  Patient Details  Name: Sara Frye MRN: [id number] Date of Birth: 02-21-1929 Referring Provider (PT): Basil Dess, MD   Encounter Date: 09/15/2019  PT End of Session - 09/15/19 1143    Visit Number  3    Date for PT Re-Evaluation  11/02/19    Authorization Type  Medicare BCBS    PT Start Time  1103    PT Stop Time  1141    PT Time Calculation (min)  38 min    Activity Tolerance  Patient tolerated treatment well    Behavior During Therapy  [facility] for tasks assessed/performed       Past Medical History:  Diagnosis Date  . Arthritis    spine  . Atrial fibrillation ([place])   . Brain aneurysm 01/07/2017  . Dysrhythmia    afib  . H/O cerebral aneurysm repair ACA S/P Coiling by Dr. Estanislado Pandy 01/07/17 01/07/2017  . Headache    with Eliquis  . Hypertension   . Hypothyroidism   . Maternal blood transfusion   . Neuromuscular disorder ([place])    spinal injury- "I'm unsure of walking, I'm very careful"  . Shingles   . Stroke [facility]) 11/2015    Past Surgical History:  Procedure Laterality Date  . ABDOMINAL HYSTERECTOMY    . APPENDECTOMY    . CARPAL TUNNEL RELEASE Bilateral   . CHOLECYSTECTOMY    . IR ANGIO INTRA EXTRACRAN SEL COM CAROTID INNOMINATE BILAT MOD SED  12/30/2016  . IR ANGIO INTRA EXTRACRAN SEL COM CAROTID INNOMINATE BILAT MOD SED  03/08/2018  . IR ANGIO INTRA EXTRACRAN SEL INTERNAL CAROTID UNI R MOD SED  01/07/2017  . IR ANGIO VERTEBRAL SEL SUBCLAVIAN INNOMINATE UNI R MOD SED  12/30/2016  . IR ANGIO VERTEBRAL SEL VERTEBRAL BILAT MOD SED  03/08/2018  . IR ANGIO VERTEBRAL SEL VERTEBRAL UNI L MOD SED  12/30/2016  . IR ANGIOGRAM FOLLOW UP STUDY  01/07/2017  . IR ANGIOGRAM FOLLOW UP STUDY  01/07/2017  . IR ANGIOGRAM FOLLOW UP STUDY  01/07/2017  . IR NEURO EACH ADD'L AFTER BASIC UNI RIGHT (MS)   01/07/2017  . IR RADIOLOGIST EVAL & MGMT  11/24/2016  . IR RADIOLOGIST EVAL & MGMT  01/25/2017  . IR TRANSCATH/EMBOLIZ  01/07/2017  . JOINT REPLACEMENT Bilateral    arthroscopic - knees  . RADIOLOGY WITH ANESTHESIA N/A 12/30/2016   Procedure: RADIOLOGY WITH ANESTHESIA  EMBOLIZATION;  Surgeon: Luanne Bras, MD;  Location: [place];  Service: Radiology;  Laterality: N/A;  . RADIOLOGY WITH ANESTHESIA N/A 01/07/2017   Procedure: EMBOLIZATION;  Surgeon: Luanne Bras, MD;  Location: [place];  Service: Radiology;  Laterality: N/A;    There were no vitals filed for this visit.  Subjective Assessment - 09/15/19 1104    Subjective  I just took two Tylenol for pain.  It bothers me to sit in the car when it hurts like this.  I'm doing my exercises 2-3 times a day.  I don't do standing things b/c I get a little dizzy.    Pertinent History  chronic LBP with stenosis, HTN, CVA (2017), bil knee scope    Limitations  Sitting;Standing;Walking    How long can you sit comfortably?  30 minutes    How long can you stand comfortably?  30 minutes    How long can you walk comfortably?  household distances  Diagnostic tests  MRI scheduled soon    Patient Stated Goals  reduce LBP, improve endurance and strength                       [facility] Adult PT Treatment/Exercise - 09/15/19 0001      Lumbar Exercises: Stretches   Gastroc Stretch  Left;Right;1 rep;30 seconds    Gastroc Stretch Limitations  slant board      Lumbar Exercises: Aerobic   Nustep  L1 x 6' with towel under Rt heel at 3 min to help ankle pain, PT present to montitor and discuss progress with HEP      Lumbar Exercises: Standing   Heel Raises  20 reps    Heel Raises Limitations  holding edge of TM    Functional Squats Limitations  mini squats holding edge of TM 2x5      Lumbar Exercises: Seated   Long Arc Quad on Chair  Strengthening;Weights;1 set;10 reps;Both    LAQ on Chair Weights (lbs)  1      Knee/Hip Exercises: Seated    Marching  Strengthening;Both;20 reps;Weights    Marching Weights  1 lbs.    Abduction/Adduction   Strengthening;Both;2 sets;10 reps    Abd/Adduction Limitations  ball squeeze 5"x10 reps and abduction with yellow band             PT Education - 09/15/19 1133    Education Details  Access Code: [email or web address]    Person(s) Educated  Patient    Methods  Explanation;Demonstration;Handout    Comprehension  Verbalized understanding;Returned demonstration       PT Short Term Goals - 09/15/19 1146      PT SHORT TERM GOAL #1   Title  be independent in independent HEP    Baseline  doing 2-3 times a day    Status  Achieved        PT Long Term Goals - 09/07/19 1057      PT LONG TERM GOAL #1   Title  be independent in advanced HEP    Time  8    Period  Weeks    Status  New    Target Date  11/02/19      PT LONG TERM GOAL #2   Title  reduce FOTO to < or = to 50% limitation    Baseline  --    Time  8    Period  Weeks    Target Date  11/02/19      PT LONG TERM GOAL #3   Title  report a 50% reduction in LBP and leg pain with standing and walking    Baseline  --    Time  8    Period  Weeks    Status  New    Target Date  11/02/19      PT LONG TERM GOAL #4   Title  improve LE strength to perform sit to stand with minimal UE support required    Baseline  --    Time  8    Period  Weeks    Status  New    Target Date  11/02/19      PT LONG TERM GOAL #5   Title  perform 5x sit to stand with UE support in < or = to 20 seconds to improve safety    Time  8    Period  Weeks    Status  New    Target Date  11/02/19  Plan - 09/15/19 1143    Clinical Impression Statement  Pt reported dizziness with standing posture exercise when done at home.  PT noted she was extending cervical spine and looking up so corrected with VCs and Pt was able to do without dizziness.  PT progressed HEP to include standing heel raises at counter and hip abd with yellow band seated.  PT also  demo'd how Pt could use yellow band for seated marching.  PT used 1# ankle weights in clinic for LAQs and seated march and Pt was able to do 2x10 without difficulty or pain.  She will continue to benefit from skilled PT along current POC.    Comorbidities  CVA, spinal stenosis    PT Frequency  2x / week    PT Duration  8 weeks    PT Treatment/Interventions  ADLs/Self Care Home Management;Cryotherapy;Electrical Stimulation;Moist Heat;Gait training;Stair training;Functional mobility training;Therapeutic activities;Therapeutic exercise;Balance training;Neuromuscular re-education;Patient/family education;Manual techniques;Passive range of motion;Dry needling;Taping    PT Next Visit Plan  slant board gastroc stretch, standing heel raises and mini squats at counter, try abd/ext toe taps standing, progress seated ther ex resistance as tol, add UE row    Consulted and Agree with Plan of Care  Patient       Patient will benefit from skilled therapeutic intervention in order to improve the following deficits and impairments:     Visit Diagnosis: Chronic bilateral low back pain with sciatica, sciatica laterality unspecified  Muscle weakness (generalized)  Other abnormalities of gait and mobility     Problem List Patient Active Problem List   Diagnosis Date Noted  . BPPV (benign paroxysmal positional vertigo) 04/16/2017  . History of stroke 03/09/2017  . H/O cerebral aneurysm repair ACA S/P Coiling by Dr. Estanislado Pandy 01/07/17 01/07/2017  . Chronic anticoagulation 04/16/2016  . Speech disturbance 12/17/2015  . Atrial fibrillation ([place]) 12/17/2015  . Hypertension 12/17/2015  . Hyperlipidemia 12/17/2015  . Dizziness 12/17/2015  . Stroke (cerebrum) ([place])     Adriana Lina, PT 09/15/19 11:47 AM   [facility] [address] [place], [place], [address] Phone: [phone]   Fax:  [phone]  Name: Sara Frye MRN:  [id number] Date of Birth: 04-Jan-1929

## 2019-09-15 NOTE — Patient Instructions (Signed)
Access Code: [email or web address]  URL: [email or web address]  Date: 09/15/2019  Prepared by: Venetia Night Zimir Kittleson   Exercises  Seated Long Arc Quad - 10 reps - 2 sets - 5 hold - 3x daily - 7x weekly  Seated March - 10 reps - 3 sets - 3x daily - 7x weekly  Seated Heel Toe Raises - 10 reps - 2 sets - 3x daily - 7x weekly  Seated Hamstring Stretch - 3 reps - 1 sets - 20 hold - 3x daily - 7x weekly  Correct Standing Posture - 10 reps - 3 sets - 1x daily - 7x weekly  Seated Hip Abduction with Resistance - 10 reps - 2 sets - 1x daily - 7x weekly  Heel rises with counter support - 10 reps - 2 sets - 1x daily - 7x weekly

## 2019-09-18 ENCOUNTER — Ambulatory Visit: Payer: Medicare Other

## 2019-09-19 ENCOUNTER — Ambulatory Visit: Payer: Medicare Other | Admitting: Physical Therapy

## 2019-09-19 ENCOUNTER — Other Ambulatory Visit: Payer: Self-pay

## 2019-09-19 ENCOUNTER — Encounter: Payer: Self-pay | Admitting: Physical Therapy

## 2019-09-19 DIAGNOSIS — M6281 Muscle weakness (generalized): Secondary | ICD-10-CM

## 2019-09-19 DIAGNOSIS — G8929 Other chronic pain: Secondary | ICD-10-CM

## 2019-09-19 DIAGNOSIS — R2689 Other abnormalities of gait and mobility: Secondary | ICD-10-CM

## 2019-09-19 DIAGNOSIS — M5440 Lumbago with sciatica, unspecified side: Secondary | ICD-10-CM | POA: Diagnosis not present

## 2019-09-19 NOTE — Therapy (Signed)
[facility] [address], [place], [place], [address] Phone: [phone]   Fax:  [phone]  Physical Therapy Treatment  Patient Details  Name: Sara Frye MRN: [id number] Date of Birth: 06/05/1929 Referring Provider (PT): Basil Dess, MD   Encounter Date: 09/19/2019  PT End of Session - 09/19/19 0814    Visit Number  4    Date for PT Re-Evaluation  11/02/19    Authorization Type  Medicare BCBS    PT Start Time  0800    PT Stop Time  0839    PT Time Calculation (min)  39 min    Activity Tolerance  Patient tolerated treatment well    Behavior During Therapy  [facility] for tasks assessed/performed       Past Medical History:  Diagnosis Date  . Arthritis    spine  . Atrial fibrillation ([place])   . Brain aneurysm 01/07/2017  . Dysrhythmia    afib  . H/O cerebral aneurysm repair ACA S/P Coiling by Dr. Estanislado Pandy 01/07/17 01/07/2017  . Headache    with Eliquis  . Hypertension   . Hypothyroidism   . Maternal blood transfusion   . Neuromuscular disorder ([place])    spinal injury- "I'm unsure of walking, I'm very careful"  . Shingles   . Stroke [facility]) 11/2015    Past Surgical History:  Procedure Laterality Date  . ABDOMINAL HYSTERECTOMY    . APPENDECTOMY    . CARPAL TUNNEL RELEASE Bilateral   . CHOLECYSTECTOMY    . IR ANGIO INTRA EXTRACRAN SEL COM CAROTID INNOMINATE BILAT MOD SED  12/30/2016  . IR ANGIO INTRA EXTRACRAN SEL COM CAROTID INNOMINATE BILAT MOD SED  03/08/2018  . IR ANGIO INTRA EXTRACRAN SEL INTERNAL CAROTID UNI R MOD SED  01/07/2017  . IR ANGIO VERTEBRAL SEL SUBCLAVIAN INNOMINATE UNI R MOD SED  12/30/2016  . IR ANGIO VERTEBRAL SEL VERTEBRAL BILAT MOD SED  03/08/2018  . IR ANGIO VERTEBRAL SEL VERTEBRAL UNI L MOD SED  12/30/2016  . IR ANGIOGRAM FOLLOW UP STUDY  01/07/2017  . IR ANGIOGRAM FOLLOW UP STUDY  01/07/2017  . IR ANGIOGRAM FOLLOW UP STUDY  01/07/2017  . IR NEURO EACH ADD'L AFTER BASIC UNI RIGHT (MS)   01/07/2017  . IR RADIOLOGIST EVAL & MGMT  11/24/2016  . IR RADIOLOGIST EVAL & MGMT  01/25/2017  . IR TRANSCATH/EMBOLIZ  01/07/2017  . JOINT REPLACEMENT Bilateral    arthroscopic - knees  . RADIOLOGY WITH ANESTHESIA N/A 12/30/2016   Procedure: RADIOLOGY WITH ANESTHESIA  EMBOLIZATION;  Surgeon: Luanne Bras, MD;  Location: [place];  Service: Radiology;  Laterality: N/A;  . RADIOLOGY WITH ANESTHESIA N/A 01/07/2017   Procedure: EMBOLIZATION;  Surgeon: Luanne Bras, MD;  Location: [place];  Service: Radiology;  Laterality: N/A;    There were no vitals filed for this visit.  Subjective Assessment - 09/19/19 0804    Subjective  Pt states that things are going ok today.  She is working on her HEP.    Pertinent History  chronic LBP with stenosis, HTN, CVA (2017), bil knee scope    Limitations  Sitting;Standing;Walking    How long can you sit comfortably?  30 minutes    How long can you stand comfortably?  30 minutes    How long can you walk comfortably?  household distances    Diagnostic tests  MRI scheduled soon    Patient Stated Goals  reduce LBP, improve endurance and strength    Currently in  Pain?  No/denies                       [facility] Adult PT Treatment/Exercise - 09/19/19 0001      Lumbar Exercises: Aerobic   Nustep  L1 x3 min PT present to discuss session      Lumbar Exercises: Seated   Long Arc Quad on Chair  Strengthening;Both;2 sets;10 reps    LAQ on Chair Weights (lbs)  2      Knee/Hip Exercises: Clinical research associate  Both;2 reps;30 seconds    Gastroc Stretch Limitations  standing slant board      Knee/Hip Exercises: Standing   Heel Raises  Both;2 sets;10 reps    Hip Abduction  Both;2 sets;10 reps;Knee straight    Abduction Limitations  toe tap out to the side      Knee/Hip Exercises: Seated   Ball Squeeze  15x3 sec hold     Other Seated Knee/Hip Exercises  BUE rows red TB 2x15 reps     Marching  Both;Strengthening;2 sets;10 reps    Marching  Weights  2 lbs.             PT Education - 09/19/19 0839    Education Details  technique with therex    Person(s) Educated  Patient    Methods  Explanation;Verbal cues    Comprehension  Verbalized understanding;Returned demonstration       PT Short Term Goals - 09/19/19 0815      PT SHORT TERM GOAL #1   Title  be independent in independent HEP    Time  4    Period  Weeks    Status  Achieved    Target Date  10/05/19      PT SHORT TERM GOAL #2   Title  report a 25% reduction in LBP and bil leg pain with standing and walking    Time  4    Period  Weeks    Status  On-going    Target Date  10/05/19      PT SHORT TERM GOAL #3   Title  perform 5x sit to stand with UE support in < or = to 22 seconds to improve safety    Time  4    Period  Weeks    Status  On-going    Target Date  10/05/19      PT SHORT TERM GOAL #4   Title  report postural corrections with standing erect with houshold tasks    Time  4    Status  New    Target Date  10/05/19        PT Long Term Goals - 09/07/19 1057      PT LONG TERM GOAL #1   Title  be independent in advanced HEP    Time  8    Period  Weeks    Status  New    Target Date  11/02/19      PT LONG TERM GOAL #2   Title  reduce FOTO to < or = to 50% limitation    Baseline  --    Time  8    Period  Weeks    Target Date  11/02/19      PT LONG TERM GOAL #3   Title  report a 50% reduction in LBP and leg pain with standing and walking    Baseline  --    Time  8    Period  Weeks    Status  New    Target Date  11/02/19      PT LONG TERM GOAL #4   Title  improve LE strength to perform sit to stand with minimal UE support required    Baseline  --    Time  8    Period  Weeks    Status  New    Target Date  11/02/19      PT LONG TERM GOAL #5   Title  perform 5x sit to stand with UE support in < or = to 20 seconds to improve safety    Time  8    Period  Weeks    Status  New    Target Date  11/02/19            Plan -  09/19/19 0839    Clinical Impression Statement  Pt arrived without pain today. She continues to work on her HEP without complaints and her posture awareness throughout her day is improved. Session continued with combination of seated and standing therex to promote LE flexibility and strength. PT monitored pt response to standing therex, and there was no report of increased dizziness. Pt would continue to benefit from skilled PT to progress her functional strength and safety with activity.    Comorbidities  CVA, spinal stenosis    PT Frequency  2x / week    PT Duration  8 weeks    PT Treatment/Interventions  ADLs/Self Care Home Management;Cryotherapy;Electrical Stimulation;Moist Heat;Gait training;Stair training;Functional mobility training;Therapeutic activities;Therapeutic exercise;Balance training;Neuromuscular re-education;Patient/family education;Manual techniques;Passive range of motion;Dry needling;Taping    PT Next Visit Plan  progress LE strength and flexibility    Consulted and Agree with Plan of Care  Patient       Patient will benefit from skilled therapeutic intervention in order to improve the following deficits and impairments:     Visit Diagnosis: Chronic bilateral low back pain with sciatica, sciatica laterality unspecified  Muscle weakness (generalized)  Other abnormalities of gait and mobility     Problem List Patient Active Problem List   Diagnosis Date Noted  . BPPV (benign paroxysmal positional vertigo) 04/16/2017  . History of stroke 03/09/2017  . H/O cerebral aneurysm repair ACA S/P Coiling by Dr. Estanislado Pandy 01/07/17 01/07/2017  . Chronic anticoagulation 04/16/2016  . Speech disturbance 12/17/2015  . Atrial fibrillation ([place]) 12/17/2015  . Hypertension 12/17/2015  . Hyperlipidemia 12/17/2015  . Dizziness 12/17/2015  . Stroke (cerebrum) [facility])     8:43 AM,09/19/19 Sherol Dade PT, DPT [facility] [address] [place], [place], [address] Phone: [phone]   Fax:  [phone]  Name: Sara Frye MRN: [id number] Date of Birth: 01-03-1929

## 2019-09-19 NOTE — Patient Instructions (Signed)
Access Code: [email or web address]  URL: [email or web address]  Date: 09/19/2019  Prepared by: Sherol Dade    Exercises Seated Long Arc Quad- 10 reps- 2 sets- 5 hold- 3x daily- 7x weekly  Seated March- 10 reps- 3 sets- 3x daily- 7x weekly  Seated Hamstring Stretch- 3 reps- 1 sets- 20 hold- 3x daily- 7x weekly  Correct Standing Posture- 10 reps- 3 sets- 1x daily- 7x weekly  Seated Hip Abduction with Resistance- 10 reps- 2 sets- 1x daily- 7x weekly  Heel rises with counter support- 10 reps- 2 sets- 1x daily- 7x weekly    [facility] [address] [place], [place] [address] Phone # [phone] Fax [phone]

## 2019-09-21 ENCOUNTER — Other Ambulatory Visit: Payer: Self-pay

## 2019-09-21 ENCOUNTER — Ambulatory Visit: Payer: Medicare Other

## 2019-09-21 DIAGNOSIS — R2689 Other abnormalities of gait and mobility: Secondary | ICD-10-CM | POA: Diagnosis not present

## 2019-09-21 DIAGNOSIS — G8929 Other chronic pain: Secondary | ICD-10-CM

## 2019-09-21 DIAGNOSIS — M5440 Lumbago with sciatica, unspecified side: Secondary | ICD-10-CM | POA: Diagnosis not present

## 2019-09-21 DIAGNOSIS — M6281 Muscle weakness (generalized): Secondary | ICD-10-CM

## 2019-09-21 NOTE — Therapy (Signed)
[facility] [address], [place], [place], [address] Phone: [phone]   Fax:  [phone]  Physical Therapy Treatment  Patient Details  Name: Sara Frye MRN: [id number] Date of Birth: 07-24-1929 Referring Provider (PT): Basil Dess, MD   Encounter Date: 09/21/2019  PT End of Session - 09/21/19 1223    Visit Number  5    Date for PT Re-Evaluation  11/02/19    Authorization Type  Medicare BCBS    PT Start Time  1144    PT Stop Time  1225    PT Time Calculation (min)  41 min    Activity Tolerance  Patient tolerated treatment well    Behavior During Therapy  [facility] for tasks assessed/performed       Past Medical History:  Diagnosis Date  . Arthritis    spine  . Atrial fibrillation ([place])   . Brain aneurysm 01/07/2017  . Dysrhythmia    afib  . H/O cerebral aneurysm repair ACA S/P Coiling by Dr. Estanislado Pandy 01/07/17 01/07/2017  . Headache    with Eliquis  . Hypertension   . Hypothyroidism   . Maternal blood transfusion   . Neuromuscular disorder ([place])    spinal injury- "I'm unsure of walking, I'm very careful"  . Shingles   . Stroke [facility]) 11/2015    Past Surgical History:  Procedure Laterality Date  . ABDOMINAL HYSTERECTOMY    . APPENDECTOMY    . CARPAL TUNNEL RELEASE Bilateral   . CHOLECYSTECTOMY    . IR ANGIO INTRA EXTRACRAN SEL COM CAROTID INNOMINATE BILAT MOD SED  12/30/2016  . IR ANGIO INTRA EXTRACRAN SEL COM CAROTID INNOMINATE BILAT MOD SED  03/08/2018  . IR ANGIO INTRA EXTRACRAN SEL INTERNAL CAROTID UNI R MOD SED  01/07/2017  . IR ANGIO VERTEBRAL SEL SUBCLAVIAN INNOMINATE UNI R MOD SED  12/30/2016  . IR ANGIO VERTEBRAL SEL VERTEBRAL BILAT MOD SED  03/08/2018  . IR ANGIO VERTEBRAL SEL VERTEBRAL UNI L MOD SED  12/30/2016  . IR ANGIOGRAM FOLLOW UP STUDY  01/07/2017  . IR ANGIOGRAM FOLLOW UP STUDY  01/07/2017  . IR ANGIOGRAM FOLLOW UP STUDY  01/07/2017  . IR NEURO EACH ADD'L AFTER BASIC UNI RIGHT (MS)   01/07/2017  . IR RADIOLOGIST EVAL & MGMT  11/24/2016  . IR RADIOLOGIST EVAL & MGMT  01/25/2017  . IR TRANSCATH/EMBOLIZ  01/07/2017  . JOINT REPLACEMENT Bilateral    arthroscopic - knees  . RADIOLOGY WITH ANESTHESIA N/A 12/30/2016   Procedure: RADIOLOGY WITH ANESTHESIA  EMBOLIZATION;  Surgeon: Luanne Bras, MD;  Location: [place];  Service: Radiology;  Laterality: N/A;  . RADIOLOGY WITH ANESTHESIA N/A 01/07/2017   Procedure: EMBOLIZATION;  Surgeon: Luanne Bras, MD;  Location: [place];  Service: Radiology;  Laterality: N/A;    There were no vitals filed for this visit.  Subjective Assessment - 09/21/19 1147    Subjective  I'm doing pretty good and I'm doing my exercises every day.    Currently in Pain?  No/denies                       [facility] Adult PT Treatment/Exercise - 09/21/19 0001      Lumbar Exercises: Aerobic   Nustep  L2x 6'   PT present to monitor- no fatigue today     Lumbar Exercises: Standing   Row  Strengthening;Both;20 reps;Theraband    Theraband Level (Row)  Level 2 (Red)    Shoulder Extension  Both;Theraband    Theraband Level (Shoulder Extension)  Level 2 (Red)      Lumbar Exercises: Seated   Long Arc Quad on Chair  Strengthening;Both;2 sets;10 reps    LAQ on Chair Weights (lbs)  2    Other Seated Lumbar Exercises  press into foam roll 5" 2x10    Other Seated Lumbar Exercises  shoulder flexion 1# 2x10      Knee/Hip Exercises: Standing   Heel Raises  Both;2 sets;10 reps    Hip Abduction  Both;2 sets;10 reps;Knee straight    Abduction Limitations  2# added    Hip Extension  Stengthening;2 sets;10 reps    Extension Limitations  2# added      Knee/Hip Exercises: Seated   Ball Squeeze  5" hold x 20    Marching  Both;Strengthening;2 sets;10 reps    Marching Weights  2 lbs.               PT Short Term Goals - 09/21/19 1225      PT SHORT TERM GOAL #1   Title  be independent in independent HEP    Status  Achieved      PT SHORT TERM  GOAL #2   Title  report a 25% reduction in LBP and bil leg pain with standing and walking    Status  Achieved      PT SHORT TERM GOAL #4   Title  report postural corrections with standing erect with houshold tasks    Status  Achieved        PT Long Term Goals - 09/07/19 1057      PT LONG TERM GOAL #1   Title  be independent in advanced HEP    Time  8    Period  Weeks    Status  New    Target Date  11/02/19      PT LONG TERM GOAL #2   Title  reduce FOTO to < or = to 50% limitation    Baseline  --    Time  8    Period  Weeks    Target Date  11/02/19      PT LONG TERM GOAL #3   Title  report a 50% reduction in LBP and leg pain with standing and walking    Baseline  --    Time  8    Period  Weeks    Status  New    Target Date  11/02/19      PT LONG TERM GOAL #4   Title  improve LE strength to perform sit to stand with minimal UE support required    Baseline  --    Time  8    Period  Weeks    Status  New    Target Date  11/02/19      PT LONG TERM GOAL #5   Title  perform 5x sit to stand with UE support in < or = to 20 seconds to improve safety    Time  8    Period  Weeks    Status  New    Target Date  11/02/19            Plan - 09/21/19 1158    Clinical Impression Statement  Pt remains consistent and compliant with HEP.  Pt is working on erect posture and is using UE support to assist as she gets dizzy when she stands erect.  Pt was able to do NuStep for 6  minutes without need for rest of fatigue today.  Pt requires supervision and contact guard assist for safety and to monitor for pain, fatigue.  Pt with improved posture overall and is making postural corrections with exercise.  Pt with chronic pain and weakness associated with spinal stenosis and will continue to benefit from skilled PT for strength, flexibility and balance exercises.    PT Frequency  2x / week    PT Duration  8 weeks    PT Treatment/Interventions  ADLs/Self Care Home  Management;Cryotherapy;Electrical Stimulation;Moist Heat;Gait training;Stair training;Functional mobility training;Therapeutic activities;Therapeutic exercise;Balance training;Neuromuscular re-education;Patient/family education;Manual techniques;Passive range of motion;Dry needling;Taping    PT Next Visit Plan  progress LE strength and flexibility    PT Home Exercise Plan  Access Code: [email or web address]    Consulted and Agree with Plan of Care  Patient       Patient will benefit from skilled therapeutic intervention in order to improve the following deficits and impairments:  Abnormal gait, Decreased activity tolerance, Decreased balance, Decreased endurance, Decreased mobility, Decreased strength, Decreased range of motion, Postural dysfunction, Improper body mechanics, Pain, Increased muscle spasms, Difficulty walking, Impaired flexibility  Visit Diagnosis: Muscle weakness (generalized)  Chronic bilateral low back pain with sciatica, sciatica laterality unspecified  Other abnormalities of gait and mobility     Problem List Patient Active Problem List   Diagnosis Date Noted  . BPPV (benign paroxysmal positional vertigo) 04/16/2017  . History of stroke 03/09/2017  . H/O cerebral aneurysm repair ACA S/P Coiling by Dr. Estanislado Pandy 01/07/17 01/07/2017  . Chronic anticoagulation 04/16/2016  . Speech disturbance 12/17/2015  . Atrial fibrillation ([place]) 12/17/2015  . Hypertension 12/17/2015  . Hyperlipidemia 12/17/2015  . Dizziness 12/17/2015  . Stroke (cerebrum) [facility])    Sigurd Sos, PT 09/21/19 12:25 PM  [facility] [address] [place], [place], [address] Phone: [phone]   Fax:  [phone]  Name: TRISTYNN COUPAL MRN: [id number] Date of Birth: 29-Nov-1928

## 2019-09-26 ENCOUNTER — Other Ambulatory Visit: Payer: Self-pay

## 2019-09-26 ENCOUNTER — Ambulatory Visit: Payer: Medicare Other | Attending: Specialist

## 2019-09-26 DIAGNOSIS — M6281 Muscle weakness (generalized): Secondary | ICD-10-CM | POA: Diagnosis not present

## 2019-09-26 DIAGNOSIS — M5440 Lumbago with sciatica, unspecified side: Secondary | ICD-10-CM | POA: Insufficient documentation

## 2019-09-26 DIAGNOSIS — G8929 Other chronic pain: Secondary | ICD-10-CM | POA: Insufficient documentation

## 2019-09-26 DIAGNOSIS — R2689 Other abnormalities of gait and mobility: Secondary | ICD-10-CM

## 2019-09-26 NOTE — Therapy (Signed)
[facility] [address], [place], [place], [address] Phone: [phone]   Fax:  [phone]  Physical Therapy Treatment  Patient Details  Name: Sara Frye MRN: [id number] Date of Birth: 1929/08/05 Referring Provider (PT): Basil Dess, MD   Encounter Date: 09/26/2019  PT End of Session - 09/26/19 0928    Visit Number  6    Date for PT Re-Evaluation  11/02/19    Authorization Type  Medicare BCBS    PT Start Time  0845    PT Stop Time  0928    PT Time Calculation (min)  43 min    Activity Tolerance  Patient tolerated treatment well    Behavior During Therapy  [facility] for tasks assessed/performed       Past Medical History:  Diagnosis Date  . Arthritis    spine  . Atrial fibrillation ([place])   . Brain aneurysm 01/07/2017  . Dysrhythmia    afib  . H/O cerebral aneurysm repair ACA S/P Coiling by Dr. Estanislado Pandy 01/07/17 01/07/2017  . Headache    with Eliquis  . Hypertension   . Hypothyroidism   . Maternal blood transfusion   . Neuromuscular disorder ([place])    spinal injury- "I'm unsure of walking, I'm very careful"  . Shingles   . Stroke [facility]) 11/2015    Past Surgical History:  Procedure Laterality Date  . ABDOMINAL HYSTERECTOMY    . APPENDECTOMY    . CARPAL TUNNEL RELEASE Bilateral   . CHOLECYSTECTOMY    . IR ANGIO INTRA EXTRACRAN SEL COM CAROTID INNOMINATE BILAT MOD SED  12/30/2016  . IR ANGIO INTRA EXTRACRAN SEL COM CAROTID INNOMINATE BILAT MOD SED  03/08/2018  . IR ANGIO INTRA EXTRACRAN SEL INTERNAL CAROTID UNI R MOD SED  01/07/2017  . IR ANGIO VERTEBRAL SEL SUBCLAVIAN INNOMINATE UNI R MOD SED  12/30/2016  . IR ANGIO VERTEBRAL SEL VERTEBRAL BILAT MOD SED  03/08/2018  . IR ANGIO VERTEBRAL SEL VERTEBRAL UNI L MOD SED  12/30/2016  . IR ANGIOGRAM FOLLOW UP STUDY  01/07/2017  . IR ANGIOGRAM FOLLOW UP STUDY  01/07/2017  . IR ANGIOGRAM FOLLOW UP STUDY  01/07/2017  . IR NEURO EACH ADD'L AFTER BASIC UNI RIGHT (MS)   01/07/2017  . IR RADIOLOGIST EVAL & MGMT  11/24/2016  . IR RADIOLOGIST EVAL & MGMT  01/25/2017  . IR TRANSCATH/EMBOLIZ  01/07/2017  . JOINT REPLACEMENT Bilateral    arthroscopic - knees  . RADIOLOGY WITH ANESTHESIA N/A 12/30/2016   Procedure: RADIOLOGY WITH ANESTHESIA  EMBOLIZATION;  Surgeon: Luanne Bras, MD;  Location: [place];  Service: Radiology;  Laterality: N/A;  . RADIOLOGY WITH ANESTHESIA N/A 01/07/2017   Procedure: EMBOLIZATION;  Surgeon: Luanne Bras, MD;  Location: [place];  Service: Radiology;  Laterality: N/A;    There were no vitals filed for this visit.  Subjective Assessment - 09/26/19 0850    Subjective  I'm not sleeping good.  I am waking up with itching all over.  I think it is the pasta sauce that I am using.    Currently in Pain?  Yes    Pain Score  5     Pain Location  Buttocks    Pain Orientation  Left;Right    Pain Descriptors / Indicators  Aching    Pain Onset  More than a month ago    Pain Frequency  Constant    Aggravating Factors   not sleeping well, standing and walking, sitting    Pain  Relieving Factors  change of position         [facility] PT Assessment - 09/26/19 0001      Transfers   Five time sit to stand comments   41.76 seconds   feeling dizzy so slower today                  [facility] Adult PT Treatment/Exercise - 09/26/19 0001      Lumbar Exercises: Aerobic   Nustep  L2x 8'   PT present to monitor- no fatigue today     Lumbar Exercises: Standing   Row  Strengthening;Both;20 reps;Theraband    Theraband Level (Row)  Level 2 (Red)    Shoulder Extension  Both;Theraband    Theraband Level (Shoulder Extension)  Level 2 (Red)      Lumbar Exercises: Seated   Long Arc Quad on Chair  Strengthening;Both;2 sets;10 reps    LAQ on Chair Weights (lbs)  2    Other Seated Lumbar Exercises  shoulder flexion 1# 2x10      Knee/Hip Exercises: Standing   Heel Raises  Both;2 sets;10 reps    Hip Abduction  Both;2 sets;10 reps;Knee straight     Abduction Limitations  2# added    Hip Extension  Stengthening;2 sets;10 reps    Extension Limitations  2# added      Knee/Hip Exercises: Seated   Marching  Both;Strengthening;2 sets;10 reps    Marching Weights  2 lbs.               PT Short Term Goals - 09/26/19 0859      PT SHORT TERM GOAL #1   Title  be independent in independent HEP    Status  Achieved      PT SHORT TERM GOAL #4   Title  report postural corrections with standing erect with houshold tasks    Status  Achieved        PT Long Term Goals - 09/07/19 1057      PT LONG TERM GOAL #1   Title  be independent in advanced HEP    Time  8    Period  Weeks    Status  New    Target Date  11/02/19      PT LONG TERM GOAL #2   Title  reduce FOTO to < or = to 50% limitation    Baseline  --    Time  8    Period  Weeks    Target Date  11/02/19      PT LONG TERM GOAL #3   Title  report a 50% reduction in LBP and leg pain with standing and walking    Baseline  --    Time  8    Period  Weeks    Status  New    Target Date  11/02/19      PT LONG TERM GOAL #4   Title  improve LE strength to perform sit to stand with minimal UE support required    Baseline  --    Time  8    Period  Weeks    Status  New    Target Date  11/02/19      PT LONG TERM GOAL #5   Title  perform 5x sit to stand with UE support in < or = to 20 seconds to improve safety    Time  8    Period  Weeks    Status  New    Target  Date  11/02/19            Plan - 09/26/19 0921    Clinical Impression Statement  Pt remains consistent and compliant with HEP.  Pt is working on erect posture and is using UE support to assist.  Pt was able to do NuStep for 8 minutes without need for rest of fatigue today.  Pt requires supervision and contact guard assist for safety and to monitor for pain, fatigue.  Pt with improved posture overall and is making postural corrections with exercise. Pt has not been sleeping well and reports 5/10 gluteal pain  over the past few days due to this.  Pt requires close supervision to monitor for fatigue and dizziness. Pt with chronic pain and weakness associated with spinal stenosis and will continue to benefit from skilled PT for strength, flexibility and balance exercises.    PT Frequency  2x / week    PT Duration  8 weeks    PT Treatment/Interventions  ADLs/Self Care Home Management;Cryotherapy;Electrical Stimulation;Moist Heat;Gait training;Stair training;Functional mobility training;Therapeutic activities;Therapeutic exercise;Balance training;Neuromuscular re-education;Patient/family education;Manual techniques;Passive range of motion;Dry needling;Taping    PT Next Visit Plan  progress LE strength and flexibility    PT Home Exercise Plan  Access Code: [email or web address]    Consulted and Agree with Plan of Care  Patient       Patient will benefit from skilled therapeutic intervention in order to improve the following deficits and impairments:  Abnormal gait, Decreased activity tolerance, Decreased balance, Decreased endurance, Decreased mobility, Decreased strength, Decreased range of motion, Postural dysfunction, Improper body mechanics, Pain, Increased muscle spasms, Difficulty walking, Impaired flexibility  Visit Diagnosis: Muscle weakness (generalized)  Chronic bilateral low back pain with sciatica, sciatica laterality unspecified  Other abnormalities of gait and mobility     Problem List Patient Active Problem List   Diagnosis Date Noted  . BPPV (benign paroxysmal positional vertigo) 04/16/2017  . History of stroke 03/09/2017  . H/O cerebral aneurysm repair ACA S/P Coiling by Dr. Estanislado Pandy 01/07/17 01/07/2017  . Chronic anticoagulation 04/16/2016  . Speech disturbance 12/17/2015  . Atrial fibrillation ([place]) 12/17/2015  . Hypertension 12/17/2015  . Hyperlipidemia 12/17/2015  . Dizziness 12/17/2015  . Stroke (cerebrum) [facility])     Sigurd Sos, PT 09/26/19 9:34 AM  [place] [facility] [address], [place], [place], [address] Phone: [phone]   Fax:  [phone]  Name: KALIEAH BELLOMY MRN: [id number] Date of Birth: [birth year]

## 2019-09-28 ENCOUNTER — Ambulatory Visit: Payer: Medicare Other | Admitting: Physical Therapy

## 2019-10-03 ENCOUNTER — Ambulatory Visit: Payer: Medicare Other | Admitting: Physical Therapy

## 2019-10-03 ENCOUNTER — Encounter: Payer: Self-pay | Admitting: Physical Therapy

## 2019-10-03 ENCOUNTER — Other Ambulatory Visit: Payer: Self-pay

## 2019-10-03 DIAGNOSIS — M6281 Muscle weakness (generalized): Secondary | ICD-10-CM | POA: Diagnosis not present

## 2019-10-03 DIAGNOSIS — G8929 Other chronic pain: Secondary | ICD-10-CM | POA: Diagnosis not present

## 2019-10-03 DIAGNOSIS — R2689 Other abnormalities of gait and mobility: Secondary | ICD-10-CM

## 2019-10-03 DIAGNOSIS — M5440 Lumbago with sciatica, unspecified side: Secondary | ICD-10-CM | POA: Diagnosis not present

## 2019-10-03 NOTE — Therapy (Signed)
[facility] [address], [place], [place], [address] Phone: [phone]   Fax:  [phone]  Physical Therapy Treatment  Patient Details  Name: Sara Frye MRN: [id number] Date of Birth: November 14, 1928 Referring Provider (PT): Basil Dess, MD   Encounter Date: 10/03/2019  PT End of Session - 10/03/19 0842    Visit Number  7    Date for PT Re-Evaluation  11/02/19    Authorization Type  Medicare BCBS    PT Start Time  0843    PT Stop Time  0925    PT Time Calculation (min)  42 min    Activity Tolerance  Patient tolerated treatment well    Behavior During Therapy  [facility] for tasks assessed/performed       Past Medical History:  Diagnosis Date  . Arthritis    spine  . Atrial fibrillation ([place])   . Brain aneurysm 01/07/2017  . Dysrhythmia    afib  . H/O cerebral aneurysm repair ACA S/P Coiling by Dr. Estanislado Pandy 01/07/17 01/07/2017  . Headache    with Eliquis  . Hypertension   . Hypothyroidism   . Maternal blood transfusion   . Neuromuscular disorder ([place])    spinal injury- "I'm unsure of walking, I'm very careful"  . Shingles   . Stroke [facility]) 11/2015    Past Surgical History:  Procedure Laterality Date  . ABDOMINAL HYSTERECTOMY    . APPENDECTOMY    . CARPAL TUNNEL RELEASE Bilateral   . CHOLECYSTECTOMY    . IR ANGIO INTRA EXTRACRAN SEL COM CAROTID INNOMINATE BILAT MOD SED  12/30/2016  . IR ANGIO INTRA EXTRACRAN SEL COM CAROTID INNOMINATE BILAT MOD SED  03/08/2018  . IR ANGIO INTRA EXTRACRAN SEL INTERNAL CAROTID UNI R MOD SED  01/07/2017  . IR ANGIO VERTEBRAL SEL SUBCLAVIAN INNOMINATE UNI R MOD SED  12/30/2016  . IR ANGIO VERTEBRAL SEL VERTEBRAL BILAT MOD SED  03/08/2018  . IR ANGIO VERTEBRAL SEL VERTEBRAL UNI L MOD SED  12/30/2016  . IR ANGIOGRAM FOLLOW UP STUDY  01/07/2017  . IR ANGIOGRAM FOLLOW UP STUDY  01/07/2017  . IR ANGIOGRAM FOLLOW UP STUDY  01/07/2017  . IR NEURO EACH ADD'L AFTER BASIC UNI RIGHT (MS)   01/07/2017  . IR RADIOLOGIST EVAL & MGMT  11/24/2016  . IR RADIOLOGIST EVAL & MGMT  01/25/2017  . IR TRANSCATH/EMBOLIZ  01/07/2017  . JOINT REPLACEMENT Bilateral    arthroscopic - knees  . RADIOLOGY WITH ANESTHESIA N/A 12/30/2016   Procedure: RADIOLOGY WITH ANESTHESIA  EMBOLIZATION;  Surgeon: Luanne Bras, MD;  Location: [place];  Service: Radiology;  Laterality: N/A;  . RADIOLOGY WITH ANESTHESIA N/A 01/07/2017   Procedure: EMBOLIZATION;  Surgeon: Luanne Bras, MD;  Location: [place];  Service: Radiology;  Laterality: N/A;    There were no vitals filed for this visit.  Subjective Assessment - 10/03/19 0841    Subjective  Pt states that things are going well. Her back is doing pretty good but the legs are still giving her alot of trouble especially when sitting. She is taking less Tylenol.    Currently in Pain?  No/denies    Pain Onset  More than a month ago                       [facility] Adult PT Treatment/Exercise - 10/03/19 0001      Lumbar Exercises: Stretches   Other Lumbar Stretch Exercise  seated trunk flexion with UE walkout  on green physioball x15 reps forward, Lt/Rt       Lumbar Exercises: Aerobic   Nustep  L2 x8 min PT present to discuss progress       Lumbar Exercises: Seated   Long Arc Quad on Chair  Strengthening;Both;2 sets;10 reps    LAQ on Chair Weights (lbs)  3    Other Seated Lumbar Exercises  lateral flexion with UE reach overhead x3 reps each       Knee/Hip Exercises: Stretches   Sports administrator  2 reps;Both;20 seconds    Quad Stretch Limitations  standing with LE in chair    Other Knee/Hip Stretches  Lt/Rt hip adductor stretch 5x10 sec standing      Knee/Hip Exercises: Standing   Knee Flexion  Both;Strengthening;2 sets;10 reps    Knee Flexion Limitations  2# ankle weights     Hip Abduction  Both;2 sets;10 reps;Knee straight    Abduction Limitations  2# ankle weights     Hip Extension  Stengthening;2 sets;10 reps    Extension Limitations  2#  ankle weights       Knee/Hip Exercises: Seated   Clamshell with TheraBand  Red   2x10 reps             PT Education - 10/03/19 0928    Education Details  technique with therex    Person(s) Educated  Patient    Methods  Explanation;Demonstration;Tactile cues    Comprehension  Returned demonstration;Verbalized understanding       PT Short Term Goals - 09/26/19 0859      PT SHORT TERM GOAL #1   Title  be independent in independent HEP    Status  Achieved      PT SHORT TERM GOAL #4   Title  report postural corrections with standing erect with houshold tasks    Status  Achieved        PT Long Term Goals - 09/07/19 1057      PT LONG TERM GOAL #1   Title  be independent in advanced HEP    Time  8    Period  Weeks    Status  New    Target Date  11/02/19      PT LONG TERM GOAL #2   Title  reduce FOTO to < or = to 50% limitation    Baseline  --    Time  8    Period  Weeks    Target Date  11/02/19      PT LONG TERM GOAL #3   Title  report a 50% reduction in LBP and leg pain with standing and walking    Baseline  --    Time  8    Period  Weeks    Status  New    Target Date  11/02/19      PT LONG TERM GOAL #4   Title  improve LE strength to perform sit to stand with minimal UE support required    Baseline  --    Time  8    Period  Weeks    Status  New    Target Date  11/02/19      PT LONG TERM GOAL #5   Title  perform 5x sit to stand with UE support in < or = to 20 seconds to improve safety    Time  8    Period  Weeks    Status  New    Target Date  11/02/19  Plan - 10/03/19 0927    Clinical Impression Statement  Pt arrived without reports of low back/buttock pain. Session focused on therex to increase lumbar flexibility and LE strength. Pt was able to increase resistance with several exercises without noted increase in pain. Hip adductor stretch was introduced secondary to tightness felt with active hip abduction. Pt denied dizziness with  activity, but she was closely monitored by the PT during the standing portion of her session. Will continue with current POC.    PT Frequency  2x / week    PT Duration  8 weeks    PT Treatment/Interventions  ADLs/Self Care Home Management;Cryotherapy;Electrical Stimulation;Moist Heat;Gait training;Stair training;Functional mobility training;Therapeutic activities;Therapeutic exercise;Balance training;Neuromuscular re-education;Patient/family education;Manual techniques;Passive range of motion;Dry needling;Taping    PT Next Visit Plan  progress LE strength and flexibility; update HEP if able    PT Home Exercise Plan  Access Code: [email or web address]    Consulted and Agree with Plan of Care  Patient       Patient will benefit from skilled therapeutic intervention in order to improve the following deficits and impairments:  Abnormal gait, Decreased activity tolerance, Decreased balance, Decreased endurance, Decreased mobility, Decreased strength, Decreased range of motion, Postural dysfunction, Improper body mechanics, Pain, Increased muscle spasms, Difficulty walking, Impaired flexibility  Visit Diagnosis: Muscle weakness (generalized)  Chronic bilateral low back pain with sciatica, sciatica laterality unspecified  Other abnormalities of gait and mobility     Problem List Patient Active Problem List   Diagnosis Date Noted  . BPPV (benign paroxysmal positional vertigo) 04/16/2017  . History of stroke 03/09/2017  . H/O cerebral aneurysm repair ACA S/P Coiling by Dr. Estanislado Pandy 01/07/17 01/07/2017  . Chronic anticoagulation 04/16/2016  . Speech disturbance 12/17/2015  . Atrial fibrillation ([place]) 12/17/2015  . Hypertension 12/17/2015  . Hyperlipidemia 12/17/2015  . Dizziness 12/17/2015  . Stroke (cerebrum) [facility])     9:33 AM,10/03/19 Sherol Dade PT, DPT [place] [facility] [address], [place], [place], [address] Phone: [phone]   Fax:  [phone]  Name: TRINE STAPLEFORD MRN: [id number] Date of Birth: 01-Mar-1929

## 2019-10-04 ENCOUNTER — Ambulatory Visit: Payer: Medicare Other | Admitting: Specialist

## 2019-10-05 ENCOUNTER — Other Ambulatory Visit: Payer: Self-pay

## 2019-10-05 ENCOUNTER — Ambulatory Visit: Payer: Medicare Other

## 2019-10-05 DIAGNOSIS — G8929 Other chronic pain: Secondary | ICD-10-CM | POA: Diagnosis not present

## 2019-10-05 DIAGNOSIS — R2689 Other abnormalities of gait and mobility: Secondary | ICD-10-CM

## 2019-10-05 DIAGNOSIS — M6281 Muscle weakness (generalized): Secondary | ICD-10-CM | POA: Diagnosis not present

## 2019-10-05 DIAGNOSIS — M5440 Lumbago with sciatica, unspecified side: Secondary | ICD-10-CM | POA: Diagnosis not present

## 2019-10-05 NOTE — Therapy (Signed)
[facility] [address] [place], [place], [address] Phone: [phone]   Fax:  [phone]  Physical Therapy Treatment  Patient Details  Name: Sara Frye MRN: [id number] Date of Birth: Sep 23, 1928 Referring Provider (PT): Basil Dess, MD   Encounter Date: 10/05/2019  PT End of Session - 10/05/19 0922    Visit Number  8    Date for PT Re-Evaluation  11/02/19    Authorization Type  Medicare BCBS    PT Start Time  0845    PT Stop Time  0924    PT Time Calculation (min)  39 min    Activity Tolerance  Patient tolerated treatment well;Patient limited by fatigue    Behavior During Therapy  [employer] for tasks assessed/performed       Past Medical History:  Diagnosis Date  . Arthritis    spine  . Atrial fibrillation ([place])   . Brain aneurysm 01/07/2017  . Dysrhythmia    afib  . H/O cerebral aneurysm repair ACA S/P Coiling by Dr. Estanislado Pandy 01/07/17 01/07/2017  . Headache    with Eliquis  . Hypertension   . Hypothyroidism   . Maternal blood transfusion   . Neuromuscular disorder ([place])    spinal injury- "I'm unsure of walking, I'm very careful"  . Shingles   . Stroke [facility]) 11/2015    Past Surgical History:  Procedure Laterality Date  . ABDOMINAL HYSTERECTOMY    . APPENDECTOMY    . CARPAL TUNNEL RELEASE Bilateral   . CHOLECYSTECTOMY    . IR ANGIO INTRA EXTRACRAN SEL COM CAROTID INNOMINATE BILAT MOD SED  12/30/2016  . IR ANGIO INTRA EXTRACRAN SEL COM CAROTID INNOMINATE BILAT MOD SED  03/08/2018  . IR ANGIO INTRA EXTRACRAN SEL INTERNAL CAROTID UNI R MOD SED  01/07/2017  . IR ANGIO VERTEBRAL SEL SUBCLAVIAN INNOMINATE UNI R MOD SED  12/30/2016  . IR ANGIO VERTEBRAL SEL VERTEBRAL BILAT MOD SED  03/08/2018  . IR ANGIO VERTEBRAL SEL VERTEBRAL UNI L MOD SED  12/30/2016  . IR ANGIOGRAM FOLLOW UP STUDY  01/07/2017  . IR ANGIOGRAM FOLLOW UP STUDY  01/07/2017  . IR ANGIOGRAM FOLLOW UP STUDY  01/07/2017  . IR NEURO EACH ADD'L AFTER  BASIC UNI RIGHT (MS)  01/07/2017  . IR RADIOLOGIST EVAL & MGMT  11/24/2016  . IR RADIOLOGIST EVAL & MGMT  01/25/2017  . IR TRANSCATH/EMBOLIZ  01/07/2017  . JOINT REPLACEMENT Bilateral    arthroscopic - knees  . RADIOLOGY WITH ANESTHESIA N/A 12/30/2016   Procedure: RADIOLOGY WITH ANESTHESIA  EMBOLIZATION;  Surgeon: Luanne Bras, MD;  Location: [place];  Service: Radiology;  Laterality: N/A;  . RADIOLOGY WITH ANESTHESIA N/A 01/07/2017   Procedure: EMBOLIZATION;  Surgeon: Luanne Bras, MD;  Location: [place];  Service: Radiology;  Laterality: N/A;    There were no vitals filed for this visit.  Subjective Assessment - 10/05/19 0855    Subjective  I fell asleep yesterday when I had back pain and woke up feeling disoriented.  I am better now.    Currently in Pain?  No/denies                       [facility] Adult PT Treatment/Exercise - 10/05/19 0001      Lumbar Exercises: Stretches   Other Lumbar Stretch Exercise  seated trunk flexion with UE walkout on green physioball x15 reps forward, Lt/Rt       Lumbar Exercises: Aerobic   Nustep  L2 x6  min PT present to discuss progress    fatigue because done at end of session     Lumbar Exercises: Seated   Long Arc Quad on Chair  Strengthening;Both;2 sets;10 reps    LAQ on Chair Weights (lbs)  3    Other Seated Lumbar Exercises  lateral flexion with UE reach overhead 2x5 reps each       Knee/Hip Exercises: Standing   Knee Flexion  Both;Strengthening;2 sets;10 reps    Knee Flexion Limitations  2# ankle weights     Hip Abduction  Both;2 sets;10 reps;Knee straight    Abduction Limitations  2# ankle weights     Hip Extension  Stengthening;2 sets;10 reps    Extension Limitations  2# ankle weights       Knee/Hip Exercises: Seated   Clamshell with TheraBand  Red   2x10 reps               PT Short Term Goals - 09/26/19 0859      PT SHORT TERM GOAL #1   Title  be independent in independent HEP    Status  Achieved       PT SHORT TERM GOAL #4   Title  report postural corrections with standing erect with houshold tasks    Status  Achieved        PT Long Term Goals - 10/05/19 0856      PT LONG TERM GOAL #2   Title  reduce FOTO to < or = to 50% limitation    Time  8    Period  Weeks    Status  On-going            Plan - 10/05/19 0901    Clinical Impression Statement  Pt demonstrates continued gait instability with forward trunk posture upon standing.  Pt reports significant improvement in LBP since the start of care (>50%).  Pt is working to make postural corrections at home to improve balance and lumbar strength in the upright position.  Pt requires close CGA for safety and verbal cues for alignment and technique.  Pt will continue to benefit from skilled PT to address strength, balance and improve safety with gait.    PT Frequency  2x / week    PT Duration  8 weeks    PT Treatment/Interventions  ADLs/Self Care Home Management;Cryotherapy;Electrical Stimulation;Moist Heat;Gait training;Stair training;Functional mobility training;Therapeutic activities;Therapeutic exercise;Balance training;Neuromuscular re-education;Patient/family education;Manual techniques;Passive range of motion;Dry needling;Taping    PT Next Visit Plan  progress LE strength and flexibility; balance and gait    PT Home Exercise Plan  Access Code: [email or web address]    Consulted and Agree with Plan of Care  Patient       Patient will benefit from skilled therapeutic intervention in order to improve the following deficits and impairments:  Abnormal gait, Decreased activity tolerance, Decreased balance, Decreased endurance, Decreased mobility, Decreased strength, Decreased range of motion, Postural dysfunction, Improper body mechanics, Pain, Increased muscle spasms, Difficulty walking, Impaired flexibility  Visit Diagnosis: Chronic bilateral low back pain with sciatica, sciatica laterality unspecified  Muscle weakness (generalized)  Other  abnormalities of gait and mobility     Problem List Patient Active Problem List   Diagnosis Date Noted  . BPPV (benign paroxysmal positional vertigo) 04/16/2017  . History of stroke 03/09/2017  . H/O cerebral aneurysm repair ACA S/P Coiling by Dr. Estanislado Pandy 01/07/17 01/07/2017  . Chronic anticoagulation 04/16/2016  . Speech disturbance 12/17/2015  . Atrial fibrillation ([place]) 12/17/2015  .  Hypertension 12/17/2015  . Hyperlipidemia 12/17/2015  . Dizziness 12/17/2015  . Stroke (cerebrum) [facility])      Sigurd Sos, PT 10/05/19 9:34 AM  [facility] [address] [place], [place], [address] Phone: [phone]   Fax:  [phone]  Name: CHENEY PATMAN MRN: [id number] Date of Birth: October 08, 1928

## 2019-10-06 ENCOUNTER — Encounter: Payer: Self-pay | Admitting: Cardiology

## 2019-10-06 ENCOUNTER — Other Ambulatory Visit: Payer: Self-pay

## 2019-10-06 ENCOUNTER — Ambulatory Visit: Payer: Medicare Other | Admitting: Cardiology

## 2019-10-06 VITALS — BP 140/63 | HR 71 | Temp 97.2°F | Ht 60.0 in | Wt 183.0 lb

## 2019-10-06 DIAGNOSIS — I480 Paroxysmal atrial fibrillation: Secondary | ICD-10-CM

## 2019-10-06 DIAGNOSIS — E7800 Pure hypercholesterolemia, unspecified: Secondary | ICD-10-CM

## 2019-10-06 DIAGNOSIS — I10 Essential (primary) hypertension: Secondary | ICD-10-CM

## 2019-10-06 NOTE — Progress Notes (Signed)
Primary Physician/Referring:  Deland Pretty, MD  Patient ID: Sara Frye, female    DOB: May 11, 1929, 86 y.o.   MRN: [id number]  Chief Complaint  Patient presents with  . Atrial Fibrillation  . Follow-up    6 month    HPI: Sara Frye  is a 86 y.o. female  with hypertension, hyperlipidemia,  paroxysmal Afib, bilateral MCA and ACA multifocal punctate infarcts, after which she was started on anticaogulation on 12/18/15. She was also found to have cerebral aneurysm for which she underwent endovascular obliteration of anterior communicating artery aneurysm with stent assisted coiling by Dr. Estanislado Pandy on 01/07/17. She is here for 6 month follow up today.    Except for mild cramping in bilateral especially at night, has chronic back pain, lately has had worsening pain and has seen orthopedics. She has occasional hemorrhoidal bleed.  Past Medical History:  Diagnosis Date  . Arthritis    spine  . Atrial fibrillation ([place])   . Brain aneurysm 01/07/2017  . Dysrhythmia    afib  . H/O cerebral aneurysm repair ACA S/P Coiling by Dr. Estanislado Pandy 01/07/17 01/07/2017  . Headache    with Eliquis  . Hypertension   . Hypothyroidism   . Maternal blood transfusion   . Neuromuscular disorder ([place])    spinal injury- "I'm unsure of walking, I'm very careful"  . Shingles   . Stroke [facility]) 11/2015    Past Surgical History:  Procedure Laterality Date  . ABDOMINAL HYSTERECTOMY    . APPENDECTOMY    . CARPAL TUNNEL RELEASE Bilateral   . CHOLECYSTECTOMY    . IR ANGIO INTRA EXTRACRAN SEL COM CAROTID INNOMINATE BILAT MOD SED  12/30/2016  . IR ANGIO INTRA EXTRACRAN SEL COM CAROTID INNOMINATE BILAT MOD SED  03/08/2018  . IR ANGIO INTRA EXTRACRAN SEL INTERNAL CAROTID UNI R MOD SED  01/07/2017  . IR ANGIO VERTEBRAL SEL SUBCLAVIAN INNOMINATE UNI R MOD SED  12/30/2016  . IR ANGIO VERTEBRAL SEL VERTEBRAL BILAT MOD SED  03/08/2018  . IR ANGIO VERTEBRAL SEL VERTEBRAL UNI L MOD SED  12/30/2016  . IR ANGIOGRAM  FOLLOW UP STUDY  01/07/2017  . IR ANGIOGRAM FOLLOW UP STUDY  01/07/2017  . IR ANGIOGRAM FOLLOW UP STUDY  01/07/2017  . IR NEURO EACH ADD'L AFTER BASIC UNI RIGHT (MS)  01/07/2017  . IR RADIOLOGIST EVAL & MGMT  11/24/2016  . IR RADIOLOGIST EVAL & MGMT  01/25/2017  . IR TRANSCATH/EMBOLIZ  01/07/2017  . JOINT REPLACEMENT Bilateral    arthroscopic - knees  . RADIOLOGY WITH ANESTHESIA N/A 12/30/2016   Procedure: RADIOLOGY WITH ANESTHESIA  EMBOLIZATION;  Surgeon: Luanne Bras, MD;  Location: [place];  Service: Radiology;  Laterality: N/A;  . RADIOLOGY WITH ANESTHESIA N/A 01/07/2017   Procedure: EMBOLIZATION;  Surgeon: Luanne Bras, MD;  Location: [place];  Service: Radiology;  Laterality: N/A;   Social History   Tobacco Use  . Smoking status: Never Smoker  . Smokeless tobacco: Never Used  Substance Use Topics  . Alcohol use: No    Review of Systems  Cardiovascular: Positive for claudication and dyspnea on exertion (chronic). Negative for leg swelling.  Musculoskeletal: Positive for back pain, joint pain and muscle cramps.  Gastrointestinal: Positive for hemorrhoids (no recent bleed). Negative for melena.   Objective  Blood pressure 140/63, pulse 71, temperature (!) 97.2 F (36.2 C), height 5' (1.524 m), weight 183 lb (83 kg), SpO2 99 %. Body mass index is 35.74 kg/m. Vitals with BMI 10/06/2019 09/06/2019  04/07/2019  Height 5' 0"  5' 0"  5' 0"   Weight 183 lbs 186 lbs 185 lbs 8 oz  BMI 35.74 67.59 16.38  Systolic 466 599 357  Diastolic 63 82 69  Pulse 71 65 68     Physical Exam  Constitutional:  Moderately obese  Neck: No JVD present.  Cardiovascular: Normal rate, regular rhythm and normal heart sounds. Exam reveals no gallop.  No murmur heard. Pulses:      Carotid pulses are 2+ on the right side and 2+ on the left side.      Femoral pulses are 2+ on the right side and 2+ on the left side.      Popliteal pulses are 1+ on the right side and 1+ on the left side.       Dorsalis pedis  pulses are 1+ on the right side and 1+ on the left side.       Posterior tibial pulses are 0 on the right side and 0 on the left side.  Pulmonary/Chest: Effort normal and breath sounds normal.  Abdominal: Soft. Bowel sounds are normal.  Obese   Radiology: No results found.  Laboratory examination:   CMP Latest Ref Rng & Units 04/18/2019 03/08/2018 09/07/2017  Glucose 70 - 99 mg/dL - 100(H) -  BUN 8 - 23 mg/dL - 10 -  Creatinine 0.44 - 1.00 mg/dL 0.83 0.83 0.90  Sodium 135 - 145 mmol/L - 140 -  Potassium 3.5 - 5.1 mmol/L - 3.6 -  Chloride 98 - 111 mmol/L - 105 -  CO2 22 - 32 mmol/L - 26 -  Calcium 8.9 - 10.3 mg/dL - 9.4 -  Total Protein 6.5 - 8.1 g/dL - - -  Total Bilirubin 0.3 - 1.2 mg/dL - - -  Alkaline Phos 38 - 126 U/L - - -  AST 15 - 41 U/L - - -  ALT 14 - 54 U/L - - -   CBC Latest Ref Rng & Units 03/08/2018 01/08/2017 01/06/2017  WBC 4.0 - 10.5 K/uL 6.3 8.8 7.9  Hemoglobin 12.0 - 15.0 g/dL 13.7 11.4(L) 14.4  Hematocrit 36.0 - 46.0 % 40.9 32.4(L) 41.9  Platelets 150 - 400 K/uL 243 197 284   Lipid Panel     Component Value Date/Time   CHOL 136 12/17/2015 0436   TRIG 119 12/17/2015 0436   HDL 46 12/17/2015 0436   CHOLHDL 3.0 12/17/2015 0436   VLDL 24 12/17/2015 0436   LDLCALC 66 12/17/2015 0436   HEMOGLOBIN A1C Lab Results  Component Value Date   HGBA1C 6.1 (H) 12/17/2015   MPG 128 12/17/2015   TSH No results for input(s): TSH in the last 8760 hours.   External labs 07/11/2018:  Cholesterol, total 133.000 12/15/2017 HDL 61.000 12/15/2017 LDL 57.000 12/15/2017 Triglycerides 111.000 12/22/2018  A1C N/D Hemoglobin 13.700 G/ 07/11/2018, INR 1.080 03/08/2018 Platelets 280.000 X 07/11/2018  Creatinine, Serum 0.840 MG/ 07/11/2018 Potassium 3.600 03/08/2018 ALT (SGPT) 15.000 IU/ 07/11/2018 TSH 2.470 12/22/2018  PR219 Meds:. Medications Discontinued During This Encounter  Medication Reason  . amoxicillin (AMOXIL) 500 MG capsule Error   Current Meds  Medication Sig   . ALPRAZolam (XANAX) 0.25 MG tablet Take one tablet po at the time of the MRI and may repeat x 1 after 30 minutes.  . Biotin 5000 MCG TABS Take 1 tablet by mouth daily.  . cholecalciferol (VITAMIN D) 1000 units tablet Take 1,000 Units by mouth daily.  Marland Kitchen ELIQUIS 5 MG TABS tablet TAKE 1 TABLET BY MOUTH TWICE  DAILY  . furosemide (LASIX) 20 MG tablet TAKE 1 TABLET BY MOUTH DAILY AS NEEDED FOR LEG SWELLING  . gabapentin (NEURONTIN) 100 MG capsule Take 100 mg by mouth 2 (two) times daily.   . hydrochlorothiazide (HYDRODIURIL) 25 MG tablet Take 25 mg by mouth daily.  Marland Kitchen levothyroxine (SYNTHROID, LEVOTHROID) 25 MCG tablet Take 25 mcg by mouth daily.  . meclizine (ANTIVERT) 25 MG tablet Take 12.5 mg by mouth 3 (three) times daily as needed for dizziness.  . metoprolol succinate (TOPROL-XL) 25 MG 24 hr tablet Take 12.5 mg by mouth daily.   . mirabegron ER (MYRBETRIQ) 25 MG TB24 tablet Take 25 mg by mouth daily.  . potassium chloride (K-DUR,KLOR-CON) 10 MEQ tablet TAKE 2 TABLETS BY MOUTH DAILY WITH FUROSEMIDE AS NEEDED  . rosuvastatin (CRESTOR) 5 MG tablet Take 0.5 tablets (2.5 mg total) by mouth at bedtime.  . traMADol (ULTRAM) 50 MG tablet TK 1 T PO Q 6 HOURS PRN  . valACYclovir (VALTREX) 1000 MG tablet TK 1 T PO TID FOR 7 DAYS    Cardiac Studies:   Echo 12/17/2015 - at [facility]- Moderate LVH, EF-60-65 percent.  Grade 1 diastolic dysfunction with elevated LV filling pressure.  Mitral annulus calcification.  Severely dilated LA, trace MR.  Status post endovascular obliteration of anterior communicating artery aneurysm with stent assisted coiling by Dr. Estanislado Pandy on 01/07/2017.  Assessment     ICD-10-CM   1. Paroxysmal atrial fibrillation ([place]). CHA2DS2-VASc Score is 5.  Yearly risk of stroke: 6.7% (A, F, HTN, Vasc Dz)  I48.0 EKG 12-Lead  2. Essential hypertension  I10 CMP14+EGFR    TSH    CBC  3. Hypercholesteremia  E78.00 Lipid Panel With LDL/HDL Ratio    EKG 10/06/2019: Normal sinus rhythm  with rate of 61 bpm, leftward enlargement, left axis deviation, left anterior fascicular block.  Poor R wave progression, probably normal variant.  T wave abnormality, LVH with repolarization, cannot exclude lateral ischemia.    No significant change from 04/07/2019.     Recommendations:   Sara Frye  is a 86 y.o. female  with hypertension, hyperlipidemia,  paroxysmal Afib, bilateral MCA and ACA multifocal punctate infarcts, after which she was started on anticaogulation on 12/18/15. She was also found to have cerebral aneurysm for which she underwent endovascular obliteration of anterior communicating artery aneurysm with stent assisted coiling by Dr. Estanislado Pandy on 01/07/17. She is here for 6 month follow up today.    She has chronic stable claudication, but otherwise doing well, has developed severe back pain and is presently undergoing physical therapy for the same.  She continues to remain active and is tolerating anticoagulation with Eliquis well.  She has not had any recent labs, I will send a copy to Dr. Shelia Media.  Otherwise he did not make any changes to her medication, blood pressures well controlled, will follow up on the lipid profile and I will see her back in 6 months for follow-up.  Adrian Prows, MD, [facility] 10/07/2019, 1:50 PM [facility]

## 2019-10-09 DIAGNOSIS — E7800 Pure hypercholesterolemia, unspecified: Secondary | ICD-10-CM | POA: Diagnosis not present

## 2019-10-09 DIAGNOSIS — I10 Essential (primary) hypertension: Secondary | ICD-10-CM | POA: Diagnosis not present

## 2019-10-10 ENCOUNTER — Ambulatory Visit: Payer: Medicare Other | Admitting: Physical Therapy

## 2019-10-10 ENCOUNTER — Other Ambulatory Visit: Payer: Self-pay

## 2019-10-10 DIAGNOSIS — R2689 Other abnormalities of gait and mobility: Secondary | ICD-10-CM | POA: Diagnosis not present

## 2019-10-10 DIAGNOSIS — M6281 Muscle weakness (generalized): Secondary | ICD-10-CM | POA: Diagnosis not present

## 2019-10-10 DIAGNOSIS — G8929 Other chronic pain: Secondary | ICD-10-CM

## 2019-10-10 DIAGNOSIS — M5440 Lumbago with sciatica, unspecified side: Secondary | ICD-10-CM

## 2019-10-10 LAB — TSH: TSH: 2.99 u[IU]/mL (ref 0.450–4.500)

## 2019-10-10 LAB — LIPID PANEL WITH LDL/HDL RATIO
Cholesterol, Total: 136 mg/dL (ref 100–199)
HDL: 59 mg/dL (ref >39)
LDL Chol Calc (NIH): 62 mg/dL (ref 0–99)
LDL/HDL Ratio: 1.1 ratio (ref 0.0–3.2)
Triglycerides: 75 mg/dL (ref 0–149)
VLDL Cholesterol Cal: 15 mg/dL (ref 5–40)

## 2019-10-10 LAB — CBC
Hematocrit: 36.6 % (ref 34.0–46.6)
Hemoglobin: 12.2 g/dL (ref 11.1–15.9)
MCH: 29.2 pg (ref 26.6–33.0)
MCHC: 33.3 g/dL (ref 31.5–35.7)
MCV: 88 fL (ref 79–97)
Platelets: 262 10*3/uL (ref 150–450)
RBC: 4.18 x10E6/uL (ref 3.77–5.28)
RDW: 13.6 % (ref 11.7–15.4)
WBC: 5.7 10*3/uL (ref 3.4–10.8)

## 2019-10-10 LAB — CMP14+EGFR
ALT: 15 IU/L (ref 0–32)
AST: 17 IU/L (ref 0–40)
Albumin/Globulin Ratio: 1.7 (ref 1.2–2.2)
Albumin: 4.5 g/dL (ref 3.6–4.6)
Alkaline Phosphatase: 61 IU/L (ref 39–117)
BUN/Creatinine Ratio: 21 (ref 12–28)
BUN: 18 mg/dL (ref 8–27)
Bilirubin Total: 0.3 mg/dL (ref 0.0–1.2)
CO2: 25 mmol/L (ref 20–29)
Calcium: 10.1 mg/dL (ref 8.7–10.3)
Chloride: 106 mmol/L (ref 96–106)
Creatinine, Ser: 0.84 mg/dL (ref 0.57–1.00)
GFR calc Af Amer: 72 mL/min/{1.73_m2} (ref >59)
GFR calc non Af Amer: 62 mL/min/{1.73_m2} (ref >59)
Globulin, Total: 2.7 g/dL (ref 1.5–4.5)
Glucose: 85 mg/dL (ref 65–99)
Potassium: 5.5 mmol/L — ABNORMAL HIGH (ref 3.5–5.2)
Sodium: 144 mmol/L (ref 134–144)
Total Protein: 7.2 g/dL (ref 6.0–8.5)

## 2019-10-10 NOTE — Therapy (Signed)
[facility] [address], [place], [place], [address] Phone: [phone]   Fax:  [phone]  Physical Therapy Treatment  Patient Details  Name: Sara Frye MRN: [id number] Date of Birth: July 03, 1929 Referring Provider (PT): Basil Dess, MD   Encounter Date: 10/10/2019  PT End of Session - 10/10/19 0914    Visit Number  9    Date for PT Re-Evaluation  11/02/19    Authorization Type  Medicare BCBS    PT Start Time  0845    PT Stop Time  0924    PT Time Calculation (min)  39 min    Activity Tolerance  Patient tolerated treatment well;No increased pain    Behavior During Therapy  WFL for tasks assessed/performed       Past Medical History:  Diagnosis Date  . Arthritis    spine  . Atrial fibrillation ([place])   . Brain aneurysm 01/07/2017  . Dysrhythmia    afib  . H/O cerebral aneurysm repair ACA S/P Coiling by Dr. Estanislado Pandy 01/07/17 01/07/2017  . Headache    with Eliquis  . Hypertension   . Hypothyroidism   . Maternal blood transfusion   . Neuromuscular disorder ([place])    spinal injury- "I'm unsure of walking, I'm very careful"  . Shingles   . Stroke [facility]) 11/2015    Past Surgical History:  Procedure Laterality Date  . ABDOMINAL HYSTERECTOMY    . APPENDECTOMY    . CARPAL TUNNEL RELEASE Bilateral   . CHOLECYSTECTOMY    . IR ANGIO INTRA EXTRACRAN SEL COM CAROTID INNOMINATE BILAT MOD SED  12/30/2016  . IR ANGIO INTRA EXTRACRAN SEL COM CAROTID INNOMINATE BILAT MOD SED  03/08/2018  . IR ANGIO INTRA EXTRACRAN SEL INTERNAL CAROTID UNI R MOD SED  01/07/2017  . IR ANGIO VERTEBRAL SEL SUBCLAVIAN INNOMINATE UNI R MOD SED  12/30/2016  . IR ANGIO VERTEBRAL SEL VERTEBRAL BILAT MOD SED  03/08/2018  . IR ANGIO VERTEBRAL SEL VERTEBRAL UNI L MOD SED  12/30/2016  . IR ANGIOGRAM FOLLOW UP STUDY  01/07/2017  . IR ANGIOGRAM FOLLOW UP STUDY  01/07/2017  . IR ANGIOGRAM FOLLOW UP STUDY  01/07/2017  . IR NEURO EACH ADD'L AFTER BASIC  UNI RIGHT (MS)  01/07/2017  . IR RADIOLOGIST EVAL & MGMT  11/24/2016  . IR RADIOLOGIST EVAL & MGMT  01/25/2017  . IR TRANSCATH/EMBOLIZ  01/07/2017  . JOINT REPLACEMENT Bilateral    arthroscopic - knees  . RADIOLOGY WITH ANESTHESIA N/A 12/30/2016   Procedure: RADIOLOGY WITH ANESTHESIA  EMBOLIZATION;  Surgeon: Luanne Bras, MD;  Location: [place];  Service: Radiology;  Laterality: N/A;  . RADIOLOGY WITH ANESTHESIA N/A 01/07/2017   Procedure: EMBOLIZATION;  Surgeon: Luanne Bras, MD;  Location: [place];  Service: Radiology;  Laterality: N/A;    There were no vitals filed for this visit.  Subjective Assessment - 10/10/19 0848    Subjective  Pt states things are going ok. He HEP is going well.    Currently in Pain?  No/denies                       [facility] Adult PT Treatment/Exercise - 10/10/19 0001      Lumbar Exercises: Aerobic   Nustep  L2 x10 min PT present to discuss HEP/progress      Lumbar Exercises: Standing   Row  Strengthening;Both;15 reps    Theraband Level (Row)  Level 3 (Green)    Row Limitations  x2 sets     Shoulder Extension  Both;Theraband;10 reps    Theraband Level (Shoulder Extension)  Level 1 (Yellow)    Shoulder Extension Limitations  x2 sets     Other Standing Lumbar Exercises  hip flexion standing on foam pad with 2 finger support 2x10 reps each side       Lumbar Exercises: Seated   Long Arc Quad on Chair  Strengthening;Both;2 sets;10 reps    LAQ on Chair Weights (lbs)  3      Knee/Hip Exercises: Standing   Knee Flexion  Both;Strengthening;2 sets;10 reps    Knee Flexion Limitations  3# ankle weights     Hip Abduction  Both;2 sets;10 reps;Knee straight    Abduction Limitations  3# ankle weights       Knee/Hip Exercises: Seated   Clamshell with TheraBand  Green   2x10 reps             PT Education - 10/10/19 0924    Education Details  increase to green TB resistance for clamshell    Person(s) Educated  Patient    Methods   Explanation;Verbal cues    Comprehension  Verbalized understanding;Returned demonstration       PT Short Term Goals - 09/26/19 0859      PT SHORT TERM GOAL #1   Title  be independent in independent HEP    Status  Achieved      PT SHORT TERM GOAL #4   Title  report postural corrections with standing erect with houshold tasks    Status  Achieved        PT Long Term Goals - 10/05/19 0856      PT LONG TERM GOAL #2   Title  reduce FOTO to < or = to 50% limitation    Time  8    Period  Weeks    Status  On-going            Plan - 10/10/19 0914    Clinical Impression Statement  Pt was discouraged by her fatigue at the end of her last session, only able to complete 6 minutes on the Nustep. PT was able to provide education to the pt regarding expected muscle fatigue following exercise and noted that this was good considering she was only able to complete 6 minutes at level 1 resistance towards the beginning of her POC. Pt was able to complete exercises for the LE with increased resistance. Pt required PT supervision during standing exercises on foam pad to prevent LOB. Ended with Nustep and pt was able to increase time to 10 minutes without difficulty.    PT Frequency  2x / week    PT Duration  8 weeks    PT Treatment/Interventions  ADLs/Self Care Home Management;Cryotherapy;Electrical Stimulation;Moist Heat;Gait training;Stair training;Functional mobility training;Therapeutic activities;Therapeutic exercise;Balance training;Neuromuscular re-education;Patient/family education;Manual techniques;Passive range of motion;Dry needling;Taping    PT Next Visit Plan  progress LE strength and flexibility; balance and gait    PT Home Exercise Plan  Access Code: [email or web address]    Consulted and Agree with Plan of Care  Patient       Patient will benefit from skilled therapeutic intervention in order to improve the following deficits and impairments:  Abnormal gait, Decreased activity tolerance,  Decreased balance, Decreased endurance, Decreased mobility, Decreased strength, Decreased range of motion, Postural dysfunction, Improper body mechanics, Pain, Increased muscle spasms, Difficulty walking, Impaired flexibility  Visit Diagnosis: Chronic bilateral low back pain with sciatica, sciatica laterality unspecified  Muscle  weakness (generalized)  Other abnormalities of gait and mobility     Problem List Patient Active Problem List   Diagnosis Date Noted  . BPPV (benign paroxysmal positional vertigo) 04/16/2017  . History of stroke 03/09/2017  . H/O cerebral aneurysm repair ACA S/P Coiling by Dr. Estanislado Pandy 01/07/17 01/07/2017  . Chronic anticoagulation 04/16/2016  . Speech disturbance 12/17/2015  . Atrial fibrillation ([place]) 12/17/2015  . Hypertension 12/17/2015  . Hyperlipidemia 12/17/2015  . Dizziness 12/17/2015  . Stroke (cerebrum) [facility])    9:27 AM,10/10/19 Sherol Dade PT, DPT [place] [facility] [address] [place], [place], [address] Phone: [phone]   Fax:  [phone]  Name: ARWA SEBER MRN: [id number] Date of Birth: [birth year]

## 2019-10-10 NOTE — Progress Notes (Signed)
Called and spoke with patient regarding Her lab results. Patient showed understanding to STOP all Potassium supplements and avoid bananas and kiwi.

## 2019-10-11 ENCOUNTER — Ambulatory Visit
Admission: RE | Admit: 2019-10-11 | Discharge: 2019-10-11 | Disposition: A | Discharge status: Home / Self Care | Payer: Medicare Other | Source: Ambulatory Visit | Attending: Internal Medicine | Admitting: Internal Medicine

## 2019-10-11 DIAGNOSIS — Z1231 Encounter for screening mammogram for malignant neoplasm of breast: Secondary | ICD-10-CM | POA: Diagnosis not present

## 2019-10-12 ENCOUNTER — Ambulatory Visit: Payer: Medicare Other | Admitting: Cardiology

## 2019-10-12 ENCOUNTER — Ambulatory Visit: Payer: Medicare Other | Admitting: Physical Therapy

## 2019-10-17 ENCOUNTER — Other Ambulatory Visit: Payer: Self-pay | Admitting: Cardiology

## 2019-10-19 ENCOUNTER — Ambulatory Visit
Admission: RE | Admit: 2019-10-19 | Discharge: 2019-10-19 | Disposition: A | Discharge status: Home / Self Care | Payer: Medicare Other | Source: Ambulatory Visit | Attending: Specialist | Admitting: Specialist

## 2019-10-19 ENCOUNTER — Other Ambulatory Visit: Payer: Self-pay

## 2019-10-19 DIAGNOSIS — M48061 Spinal stenosis, lumbar region without neurogenic claudication: Secondary | ICD-10-CM | POA: Diagnosis not present

## 2019-10-19 DIAGNOSIS — M4807 Spinal stenosis, lumbosacral region: Secondary | ICD-10-CM

## 2019-10-19 DIAGNOSIS — M4316 Spondylolisthesis, lumbar region: Secondary | ICD-10-CM

## 2019-10-20 ENCOUNTER — Ambulatory Visit (INDEPENDENT_AMBULATORY_CARE_PROVIDER_SITE_OTHER): Payer: Medicare Other | Admitting: Specialist

## 2019-10-20 ENCOUNTER — Encounter: Payer: Self-pay | Admitting: Specialist

## 2019-10-20 VITALS — BP 150/68 | HR 63 | Ht 60.0 in | Wt 186.0 lb

## 2019-10-20 DIAGNOSIS — M545 Low back pain: Secondary | ICD-10-CM

## 2019-10-24 ENCOUNTER — Ambulatory Visit: Payer: Medicare Other | Attending: Specialist

## 2019-10-24 ENCOUNTER — Other Ambulatory Visit: Payer: Self-pay

## 2019-10-24 DIAGNOSIS — M5440 Lumbago with sciatica, unspecified side: Secondary | ICD-10-CM | POA: Insufficient documentation

## 2019-10-24 DIAGNOSIS — G8929 Other chronic pain: Secondary | ICD-10-CM | POA: Diagnosis not present

## 2019-10-24 DIAGNOSIS — M6281 Muscle weakness (generalized): Secondary | ICD-10-CM | POA: Diagnosis not present

## 2019-10-24 DIAGNOSIS — R2689 Other abnormalities of gait and mobility: Secondary | ICD-10-CM | POA: Insufficient documentation

## 2019-10-24 NOTE — Therapy (Signed)
[facility] [address] [place], [place], [address] Phone: [phone]   Fax:  [phone]  Physical Therapy Treatment  Patient Details  Name: Sara Frye MRN: [id number] Date of Birth: 1928/08/28 Referring Provider (PT): Basil Dess, MD  Progress Note Reporting Period 09/07/19 to 10/24/19  See note below for Objective Data and Assessment of Progress/Goals.      Encounter Date: 10/24/2019  PT End of Session - 10/24/19 0924    Visit Number  10    Date for PT Re-Evaluation  11/02/19    Authorization Type  Medicare BCBS    PT Start Time  0846    PT Stop Time  0924    PT Time Calculation (min)  38 min    Activity Tolerance  Patient tolerated treatment well;No increased pain    Behavior During Therapy  WFL for tasks assessed/performed       Past Medical History:  Diagnosis Date  . Arthritis    spine  . Atrial fibrillation ([place])   . Brain aneurysm 01/07/2017  . Dysrhythmia    afib  . H/O cerebral aneurysm repair ACA S/P Coiling by Dr. Estanislado Pandy 01/07/17 01/07/2017  . Headache    with Eliquis  . Hypertension   . Hypothyroidism   . Maternal blood transfusion   . Neuromuscular disorder ([place])    spinal injury- "I'm unsure of walking, I'm very careful"  . Shingles   . Stroke [facility]) 11/2015    Past Surgical History:  Procedure Laterality Date  . ABDOMINAL HYSTERECTOMY    . APPENDECTOMY    . CARPAL TUNNEL RELEASE Bilateral   . CHOLECYSTECTOMY    . IR ANGIO INTRA EXTRACRAN SEL COM CAROTID INNOMINATE BILAT MOD SED  12/30/2016  . IR ANGIO INTRA EXTRACRAN SEL COM CAROTID INNOMINATE BILAT MOD SED  03/08/2018  . IR ANGIO INTRA EXTRACRAN SEL INTERNAL CAROTID UNI R MOD SED  01/07/2017  . IR ANGIO VERTEBRAL SEL SUBCLAVIAN INNOMINATE UNI R MOD SED  12/30/2016  . IR ANGIO VERTEBRAL SEL VERTEBRAL BILAT MOD SED  03/08/2018  . IR ANGIO VERTEBRAL SEL VERTEBRAL UNI L MOD SED  12/30/2016  . IR ANGIOGRAM FOLLOW UP STUDY   01/07/2017  . IR ANGIOGRAM FOLLOW UP STUDY  01/07/2017  . IR ANGIOGRAM FOLLOW UP STUDY  01/07/2017  . IR NEURO EACH ADD'L AFTER BASIC UNI RIGHT (MS)  01/07/2017  . IR RADIOLOGIST EVAL & MGMT  11/24/2016  . IR RADIOLOGIST EVAL & MGMT  01/25/2017  . IR TRANSCATH/EMBOLIZ  01/07/2017  . JOINT REPLACEMENT Bilateral    arthroscopic - knees  . RADIOLOGY WITH ANESTHESIA N/A 12/30/2016   Procedure: RADIOLOGY WITH ANESTHESIA  EMBOLIZATION;  Surgeon: Luanne Bras, MD;  Location: [place];  Service: Radiology;  Laterality: N/A;  . RADIOLOGY WITH ANESTHESIA N/A 01/07/2017   Procedure: EMBOLIZATION;  Surgeon: Luanne Bras, MD;  Location: [place];  Service: Radiology;  Laterality: N/A;    There were no vitals filed for this visit.  Subjective Assessment - 10/24/19 0847    Subjective  I have been doing OK at home.  I have been exercising at home.  I want to keep coming.         [facility] PT Assessment - 10/24/19 0001      Assessment   Medical Diagnosis  spinal stenosis of lumbar region with neurogenic claudication, spondylolisthesis, lumbar region    Referring Provider (PT)  Basil Dess, MD    Onset Date/Surgical Date  07/08/19  Observation/Other Assessments   Focus on Therapeutic Outcomes (FOTO)   46% limitation      Transfers   Five time sit to stand comments   25.79 without hands                   [facility] Adult PT Treatment/Exercise - 10/24/19 0001      Lumbar Exercises: Aerobic   Nustep  L2 x6  min PT present to discuss HEP/progress   pt with fatigue and had to stop     Lumbar Exercises: Standing   Row  Strengthening;Both;15 reps    Theraband Level (Row)  Level 3 (Green)    Row Limitations  x2 sets    Shoulder Extension  Both;Theraband;10 reps    Theraband Level (Shoulder Extension)  Level 3 (Green)    Shoulder Extension Limitations  x2 sets    Other Standing Lumbar Exercises  hip flexion standing on foam pad with 2 finger support 2x10 reps each side       Lumbar  Exercises: Seated   Long Arc Quad on Chair  Strengthening;Both;2 sets;10 reps    LAQ on Chair Weights (lbs)  3    Sit to Stand  20 reps      Knee/Hip Exercises: Standing   Heel Raises  Both;2 sets;10 reps    Knee Flexion  Both;Strengthening;2 sets;10 reps    Knee Flexion Limitations  3# ankle weights     Hip Abduction  Both;2 sets;10 reps;Knee straight    Abduction Limitations  3# ankle weights       Knee/Hip Exercises: Seated   Marching  Both;Strengthening;2 sets;10 reps    Marching Limitations  3#    Abd/Adduction Limitations  ball squeezes: 5" hold 2x10               PT Short Term Goals - 09/26/19 0859      PT SHORT TERM GOAL #1   Title  be independent in independent HEP    Status  Achieved      PT SHORT TERM GOAL #4   Title  report postural corrections with standing erect with houshold tasks    Status  Achieved        PT Long Term Goals - 10/24/19 0854      PT LONG TERM GOAL #1   Title  be independent in advanced HEP    Baseline  met for current HEP    Time  8    Period  Weeks    Status  On-going      PT LONG TERM GOAL #2   Title  reduce FOTO to < or = to 50% limitation    Baseline  46%    Time  --    Period  --    Status  Achieved      PT LONG TERM GOAL #3   Title  report a 50% reduction in LBP and leg pain with standing and walking    Baseline  75% better    Status  Achieved      PT LONG TERM GOAL #4   Title  improve LE strength to perform sit to stand with minimal UE support required    Baseline  still requires UE support for safety    Time  8    Period  Weeks    Status  On-going      PT LONG TERM GOAL #5   Title  perform 5x sit to stand with UE support in < or =  to 20 seconds to improve safety    Baseline  25.79 seconds    Time  8    Period  Weeks    Status  On-going            Plan - 10/24/19 2224    Clinical Impression Statement  Pt with lapse in treatment and has stayed consistent with HEP at home for strength and  flexibility.  Pt reports 75% overall improvement in LBP since the start of care.  Pt fatigued quickly with NuStep today.  Pt was able to perform 5x sit to stand in 25.79 seconds without UE support.  This is improved from last time tested although no change from evaluation regarding time.  Reduced use of UE support.  Pt required close supervision with exercise for safety.  Pt will continue to benefit from skilled PT to address strength, balance and endurance deficits to improve safety and independence at home and in the community.    Rehab Potential  Good    PT Frequency  2x / week    PT Duration  8 weeks    PT Treatment/Interventions  ADLs/Self Care Home Management;Cryotherapy;Electrical Stimulation;Moist Heat;Gait training;Stair training;Functional mobility training;Therapeutic activities;Therapeutic exercise;Balance training;Neuromuscular re-education;Patient/family education;Manual techniques;Passive range of motion;Dry needling;Taping    PT Next Visit Plan  progress LE strength and flexibility; balance and gait.  ERO  before 11/02/19    PT Home Exercise Plan  Access Code: [email or web address]    Consulted and Agree with Plan of Care  Patient       Patient will benefit from skilled therapeutic intervention in order to improve the following deficits and impairments:  Abnormal gait, Decreased activity tolerance, Decreased balance, Decreased endurance, Decreased mobility, Decreased strength, Decreased range of motion, Postural dysfunction, Improper body mechanics, Pain, Increased muscle spasms, Difficulty walking, Impaired flexibility  Visit Diagnosis: Chronic bilateral low back pain with sciatica, sciatica laterality unspecified  Muscle weakness (generalized)  Other abnormalities of gait and mobility     Problem List Patient Active Problem List   Diagnosis Date Noted  . BPPV (benign paroxysmal positional vertigo) 04/16/2017  . History of stroke 03/09/2017  . H/O cerebral aneurysm repair ACA S/P  Coiling by Dr. Estanislado Pandy 01/07/17 01/07/2017  . Chronic anticoagulation 04/16/2016  . Speech disturbance 12/17/2015  . Atrial fibrillation ([place]) 12/17/2015  . Hypertension 12/17/2015  . Hyperlipidemia 12/17/2015  . Dizziness 12/17/2015  . Stroke (cerebrum) [facility])     Sigurd Sos, PT 10/24/19 9:27 AM   [facility] [address] [place], [place], [address] Phone: [phone]   Fax:  [phone]  Name: Sara Frye MRN: [id number] Date of Birth: [birth year]

## 2019-10-26 ENCOUNTER — Other Ambulatory Visit: Payer: Self-pay

## 2019-10-26 ENCOUNTER — Encounter: Payer: Self-pay | Admitting: Physical Therapy

## 2019-10-26 ENCOUNTER — Ambulatory Visit: Payer: Medicare Other | Admitting: Physical Therapy

## 2019-10-26 DIAGNOSIS — R2689 Other abnormalities of gait and mobility: Secondary | ICD-10-CM

## 2019-10-26 DIAGNOSIS — G8929 Other chronic pain: Secondary | ICD-10-CM | POA: Diagnosis not present

## 2019-10-26 DIAGNOSIS — M6281 Muscle weakness (generalized): Secondary | ICD-10-CM

## 2019-10-26 DIAGNOSIS — M5440 Lumbago with sciatica, unspecified side: Secondary | ICD-10-CM | POA: Diagnosis not present

## 2019-10-26 NOTE — Therapy (Addendum)
[facility] [address], [place], [place], [address] Phone: [phone]   Fax:  [phone]  Physical Therapy Treatment  Patient Details  Name: Sara Frye MRN: [id number] Date of Birth: Nov 22, 1928 Referring Provider (PT): Basil Dess, MD   Encounter Date: 10/26/2019  PT End of Session - 10/26/19 0753    Visit Number  11    Date for PT Re-Evaluation  11/02/19    Authorization Type  Medicare BCBS    PT Start Time  8916    PT Stop Time  0839    PT Time Calculation (min)  44 min    Activity Tolerance  Patient tolerated treatment well;No increased pain    Behavior During Therapy  WFL for tasks assessed/performed       Past Medical History:  Diagnosis Date  . Arthritis    spine  . Atrial fibrillation ([place])   . Brain aneurysm 01/07/2017  . Dysrhythmia    afib  . H/O cerebral aneurysm repair ACA S/P Coiling by Dr. Estanislado Pandy 01/07/17 01/07/2017  . Headache    with Eliquis  . Hypertension   . Hypothyroidism   . Maternal blood transfusion   . Neuromuscular disorder ([place])    spinal injury- "I'm unsure of walking, I'm very careful"  . Shingles   . Stroke [facility]) 11/2015    Past Surgical History:  Procedure Laterality Date  . ABDOMINAL HYSTERECTOMY    . APPENDECTOMY    . CARPAL TUNNEL RELEASE Bilateral   . CHOLECYSTECTOMY    . IR ANGIO INTRA EXTRACRAN SEL COM CAROTID INNOMINATE BILAT MOD SED  12/30/2016  . IR ANGIO INTRA EXTRACRAN SEL COM CAROTID INNOMINATE BILAT MOD SED  03/08/2018  . IR ANGIO INTRA EXTRACRAN SEL INTERNAL CAROTID UNI R MOD SED  01/07/2017  . IR ANGIO VERTEBRAL SEL SUBCLAVIAN INNOMINATE UNI R MOD SED  12/30/2016  . IR ANGIO VERTEBRAL SEL VERTEBRAL BILAT MOD SED  03/08/2018  . IR ANGIO VERTEBRAL SEL VERTEBRAL UNI L MOD SED  12/30/2016  . IR ANGIOGRAM FOLLOW UP STUDY  01/07/2017  . IR ANGIOGRAM FOLLOW UP STUDY  01/07/2017  . IR ANGIOGRAM FOLLOW UP STUDY  01/07/2017  . IR NEURO EACH ADD'L AFTER BASIC  UNI RIGHT (MS)  01/07/2017  . IR RADIOLOGIST EVAL & MGMT  11/24/2016  . IR RADIOLOGIST EVAL & MGMT  01/25/2017  . IR TRANSCATH/EMBOLIZ  01/07/2017  . JOINT REPLACEMENT Bilateral    arthroscopic - knees  . RADIOLOGY WITH ANESTHESIA N/A 12/30/2016   Procedure: RADIOLOGY WITH ANESTHESIA  EMBOLIZATION;  Surgeon: Luanne Bras, MD;  Location: [place];  Service: Radiology;  Laterality: N/A;  . RADIOLOGY WITH ANESTHESIA N/A 01/07/2017   Procedure: EMBOLIZATION;  Surgeon: Luanne Bras, MD;  Location: [place];  Service: Radiology;  Laterality: N/A;    There were no vitals filed for this visit.  Subjective Assessment - 10/26/19 0753    Subjective  Patient reports no pain today.    Pertinent History  chronic LBP with stenosis, HTN, CVA (2017), bil knee scope    Patient Stated Goals  reduce LBP, improve endurance and strength    Currently in Pain?  No/denies                       [facility] Adult PT Treatment/Exercise - 10/26/19 0001      Lumbar Exercises: Stretches   Active Hamstring Stretch  Right;Left;2 reps;20 seconds      Lumbar Exercises: Aerobic  Nustep  L1 x  6 min L2 difficult; switched to L1; some pain in post right leg     Lumbar Exercises: Standing   Row  Strengthening;Both;20 reps    Theraband Level (Row)  Level 3 (Green)    Shoulder Extension  Both;Theraband;20 reps    Theraband Level (Shoulder Extension)  Level 3 (Green)    Other Standing Lumbar Exercises  hip flexion standing on foam pad with 2 finger support 2x10 reps each side       Lumbar Exercises: Seated   Long Arc Quad on Chair  Strengthening;Both;2 sets;10 reps    LAQ on Chair Weights (lbs)  3    Sit to Stand  20 reps    Sit to Stand Limitations  cues to slow descent      Knee/Hip Exercises: Standing   Heel Raises  Both;2 sets;10 reps    Heel Raises Limitations  3#    Knee Flexion  Both;Strengthening;2 sets;10 reps    Knee Flexion Limitations  3# ankle weights     Hip Abduction  Both;2 sets;10  reps;Knee straight    Abduction Limitations  3# ankle weights       Knee/Hip Exercises: Seated   Marching  Both;Strengthening;2 sets;10 reps    Marching Limitations  3#    Abd/Adduction Limitations  ball squeezes: 5" hold 2x10               PT Short Term Goals - 09/26/19 0859      PT SHORT TERM GOAL #1   Title  be independent in independent HEP    Status  Achieved      PT SHORT TERM GOAL #4   Title  report postural corrections with standing erect with houshold tasks    Status  Achieved        PT Long Term Goals - 10/24/19 0854      PT LONG TERM GOAL #1   Title  be independent in advanced HEP    Baseline  met for current HEP    Time  8    Period  Weeks    Status  On-going      PT LONG TERM GOAL #2   Title  reduce FOTO to < or = to 50% limitation    Baseline  46%    Time  --    Period  --    Status  Achieved      PT LONG TERM GOAL #3   Title  report a 50% reduction in LBP and leg pain with standing and walking    Baseline  75% better    Status  Achieved      PT LONG TERM GOAL #4   Title  improve LE strength to perform sit to stand with minimal UE support required    Baseline  still requires UE support for safety    Time  8    Period  Weeks    Status  On-going      PT LONG TERM GOAL #5   Title  perform 5x sit to stand with UE support in < or = to 20 seconds to improve safety    Baseline  25.79 seconds    Time  8    Period  Weeks    Status  On-going            Plan - 10/26/19 5374    Clinical Impression Statement  Patient tolerated treatment well today. She reported feeling more fatigued today than  normal but was able to complete all exercises.    PT Treatment/Interventions  ADLs/Self Care Home Management;Cryotherapy;Electrical Stimulation;Moist Heat;Gait training;Stair training;Functional mobility training;Therapeutic activities;Therapeutic exercise;Balance training;Neuromuscular re-education;Patient/family education;Manual techniques;Passive  range of motion;Dry needling;Taping    PT Next Visit Plan  progress LE strength and flexibility; balance and gait.  ERO  before 11/02/19       Patient will benefit from skilled therapeutic intervention in order to improve the following deficits and impairments:  Abnormal gait, Decreased activity tolerance, Decreased balance, Decreased endurance, Decreased mobility, Decreased strength, Decreased range of motion, Postural dysfunction, Improper body mechanics, Pain, Increased muscle spasms, Difficulty walking, Impaired flexibility  Visit Diagnosis: Chronic bilateral low back pain with sciatica, sciatica laterality unspecified  Muscle weakness (generalized)  Other abnormalities of gait and mobility     Problem List Patient Active Problem List   Diagnosis Date Noted  . BPPV (benign paroxysmal positional vertigo) 04/16/2017  . History of stroke 03/09/2017  . H/O cerebral aneurysm repair ACA S/P Coiling by Dr. Estanislado Pandy 01/07/17 01/07/2017  . Chronic anticoagulation 04/16/2016  . Speech disturbance 12/17/2015  . Atrial fibrillation ([place]) 12/17/2015  . Hypertension 12/17/2015  . Hyperlipidemia 12/17/2015  . Dizziness 12/17/2015  . Stroke (cerebrum) [facility])    Madelyn Flavors PT 10/26/2019, 8:39 AM  [facility] [address], [place], [place], [address] Phone: [phone]   Fax:  [phone]  Name: Sara Frye MRN: [id number] Date of Birth: 15-Jul-1929

## 2019-10-31 ENCOUNTER — Other Ambulatory Visit: Payer: Self-pay

## 2019-10-31 ENCOUNTER — Ambulatory Visit: Payer: Medicare Other | Admitting: Physical Therapy

## 2019-10-31 ENCOUNTER — Encounter: Payer: Self-pay | Admitting: Physical Therapy

## 2019-10-31 DIAGNOSIS — M5440 Lumbago with sciatica, unspecified side: Secondary | ICD-10-CM | POA: Diagnosis not present

## 2019-10-31 DIAGNOSIS — R2689 Other abnormalities of gait and mobility: Secondary | ICD-10-CM | POA: Diagnosis not present

## 2019-10-31 DIAGNOSIS — G8929 Other chronic pain: Secondary | ICD-10-CM | POA: Diagnosis not present

## 2019-10-31 DIAGNOSIS — M6281 Muscle weakness (generalized): Secondary | ICD-10-CM

## 2019-10-31 NOTE — Therapy (Addendum)
[facility] [address] [place], [place], [address] Phone: [phone]   Fax:  [phone]  Physical Therapy Treatment  Patient Details  Name: Sara Frye MRN: [id number] Date of Birth: 09/11/1928 Referring Provider (PT): Basil Dess, MD   Encounter Date: 10/31/2019  PT End of Session - 10/31/19 1131    Visit Number  12    Date for PT Re-Evaluation  11/02/19    Authorization Type  Medicare BCBS    PT Start Time  1056    PT Stop Time  8527    PT Time Calculation (min)  46 min    Activity Tolerance  Patient tolerated treatment well;No increased pain    Behavior During Therapy  WFL for tasks assessed/performed       Past Medical History:  Diagnosis Date  . Arthritis    spine  . Atrial fibrillation ([place])   . Brain aneurysm 01/07/2017  . Dysrhythmia    afib  . H/O cerebral aneurysm repair ACA S/P Coiling by Dr. Estanislado Pandy 01/07/17 01/07/2017  . Headache    with Eliquis  . Hypertension   . Hypothyroidism   . Maternal blood transfusion   . Neuromuscular disorder ([place])    spinal injury- "I'm unsure of walking, I'm very careful"  . Shingles   . Stroke [facility]) 11/2015    Past Surgical History:  Procedure Laterality Date  . ABDOMINAL HYSTERECTOMY    . APPENDECTOMY    . CARPAL TUNNEL RELEASE Bilateral   . CHOLECYSTECTOMY    . IR ANGIO INTRA EXTRACRAN SEL COM CAROTID INNOMINATE BILAT MOD SED  12/30/2016  . IR ANGIO INTRA EXTRACRAN SEL COM CAROTID INNOMINATE BILAT MOD SED  03/08/2018  . IR ANGIO INTRA EXTRACRAN SEL INTERNAL CAROTID UNI R MOD SED  01/07/2017  . IR ANGIO VERTEBRAL SEL SUBCLAVIAN INNOMINATE UNI R MOD SED  12/30/2016  . IR ANGIO VERTEBRAL SEL VERTEBRAL BILAT MOD SED  03/08/2018  . IR ANGIO VERTEBRAL SEL VERTEBRAL UNI L MOD SED  12/30/2016  . IR ANGIOGRAM FOLLOW UP STUDY  01/07/2017  . IR ANGIOGRAM FOLLOW UP STUDY  01/07/2017  . IR ANGIOGRAM FOLLOW UP STUDY  01/07/2017  . IR NEURO EACH ADD'L AFTER BASIC  UNI RIGHT (MS)  01/07/2017  . IR RADIOLOGIST EVAL & MGMT  11/24/2016  . IR RADIOLOGIST EVAL & MGMT  01/25/2017  . IR TRANSCATH/EMBOLIZ  01/07/2017  . JOINT REPLACEMENT Bilateral    arthroscopic - knees  . RADIOLOGY WITH ANESTHESIA N/A 12/30/2016   Procedure: RADIOLOGY WITH ANESTHESIA  EMBOLIZATION;  Surgeon: Luanne Bras, MD;  Location: [place];  Service: Radiology;  Laterality: N/A;  . RADIOLOGY WITH ANESTHESIA N/A 01/07/2017   Procedure: EMBOLIZATION;  Surgeon: Luanne Bras, MD;  Location: [place];  Service: Radiology;  Laterality: N/A;    There were no vitals filed for this visit.  Subjective Assessment - 10/31/19 1129    Subjective  I'm feeling okay. I haven't been exercising at home because too many things have been happening. My back feels good. No pain. I'm able to walk better. I don't feel llike I'm going to fall.    Pertinent History  chronic LBP with stenosis, HTN, CVA (2017), bil knee scope    Patient Stated Goals  reduce LBP, improve endurance and strength    Currently in Pain?  No/denies                       [facility] Adult PT Treatment/Exercise -  10/31/19 0001      Lumbar Exercises: Aerobic   Nustep  L1 x 7 min      Lumbar Exercises: Standing   Row  Strengthening;Both;20 reps    Theraband Level (Row)  Level 3 (Green)    Shoulder Extension  Both;Theraband;20 reps    Theraband Level (Shoulder Extension)  Level 3 (Green)    Other Standing Lumbar Exercises  hip flexion standing on foam pad with 2 finger support 2x10 reps each side       Lumbar Exercises: Seated   Long Arc Quad on Chair  Strengthening;Both;2 sets;10 reps    LAQ on Chair Weights (lbs)  3    Sit to Stand  10 reps      Knee/Hip Exercises: Standing   Heel Raises  Both;2 sets;10 reps    Heel Raises Limitations  3#    Knee Flexion  Both;Strengthening;2 sets;10 reps    Knee Flexion Limitations  3# ankle weights     Hip Abduction  Both;2 sets;10 reps;Knee straight    Abduction Limitations  3#  ankle weights              PT Education - 10/31/19 1246    Education Details  HEP finalized    Person(s) Educated  Patient    Methods  Explanation;Demonstration;Handout    Comprehension  Verbalized understanding;Returned demonstration       PT Short Term Goals - 10/31/19 1121      PT SHORT TERM GOAL #1   Title  be independent in independent HEP    Status  Achieved      PT SHORT TERM GOAL #2   Title  report a 25% reduction in LBP and bil leg pain with standing and walking    Status  Achieved      PT SHORT TERM GOAL #3   Title  perform 5x sit to stand with UE support in < or = to 22 seconds to improve safety    Baseline  19 sec    Status  Achieved      PT SHORT TERM GOAL #4   Title  report postural corrections with standing erect with houshold tasks    Status  Achieved        PT Long Term Goals - 10/31/19 1122      PT LONG TERM GOAL #1   Title  be independent in advanced HEP    Baseline  met for current HEP    Status  Achieved      PT LONG TERM GOAL #2   Title  reduce FOTO to < or = to 50% limitation    Status  Achieved      PT LONG TERM GOAL #3   Title  report a 50% reduction in LBP and leg pain with standing and walking    Status  Achieved      PT LONG TERM GOAL #4   Title  improve LE strength to perform sit to stand with minimal UE support required    Status  Achieved      PT LONG TERM GOAL #5   Title  perform 5x sit to stand with UE support in < or = to 20 seconds to improve safety    Baseline  19 sec    Status  Achieved            Plan - 10/31/19 1143    Clinical Impression Statement  Patient has met all LTGs. She denies pain and reports improvement  with walking and balance. Patient comfortable and compliant with HEP and will continue. Plan to put on hold for 2 weeks and then discharge if pt does not return. She has MRI scheduled. PT explained that regardless of results she is doing well and shouldn't need to return.    Comorbidities  CVA,  spinal stenosis    PT Frequency  2x / week    PT Duration  8 weeks    PT Treatment/Interventions  ADLs/Self Care Home Management;Cryotherapy;Electrical Stimulation;Moist Heat;Gait training;Stair training;Functional mobility training;Therapeutic activities;Therapeutic exercise;Balance training;Neuromuscular re-education;Patient/family education;Manual techniques;Passive range of motion;Dry needling;Taping    PT Next Visit Plan  progress LE strength and flexibility; balance and gait.  ERO  before 11/02/19    PT Home Exercise Plan  Access Code: [email or web address]       Patient will benefit from skilled therapeutic intervention in order to improve the following deficits and impairments:  Abnormal gait, Decreased activity tolerance, Decreased balance, Decreased endurance, Decreased mobility, Decreased strength, Decreased range of motion, Postural dysfunction, Improper body mechanics, Pain, Increased muscle spasms, Difficulty walking, Impaired flexibility  Visit Diagnosis: Chronic bilateral low back pain with sciatica, sciatica laterality unspecified  Muscle weakness (generalized)  Other abnormalities of gait and mobility     Problem List Patient Active Problem List   Diagnosis Date Noted  . BPPV (benign paroxysmal positional vertigo) 04/16/2017  . History of stroke 03/09/2017  . H/O cerebral aneurysm repair ACA S/P Coiling by Dr. Estanislado Pandy 01/07/17 01/07/2017  . Chronic anticoagulation 04/16/2016  . Speech disturbance 12/17/2015  . Atrial fibrillation ([place]) 12/17/2015  . Hypertension 12/17/2015  . Hyperlipidemia 12/17/2015  . Dizziness 12/17/2015  . Stroke (cerebrum) ([place])     Almyra Free Winona Sison PT 10/31/2019, 12:52 PM PHYSICAL THERAPY DISCHARGE SUMMARY  Visits from Start of Care: 12  Current functional level related to goals / functional outcomes: Pt didn't return to PT after 10/31/19.  See above for most current status.     Remaining deficits: See above for current status.     Education /  Equipment: HEP, posture/body mechanics Plan: Patient agrees to discharge.  Patient goals were partially met. Patient is being discharged due to not returning since the last visit.  ?????        Sigurd Sos, PT 01/29/20 9:24 AM   [place] [facility] [address] [place], [place], [address] Phone: [phone]   Fax:  [phone]  Name: Sara Frye MRN: [id number] Date of Birth: 23-Mar-1929

## 2019-10-31 NOTE — Patient Instructions (Signed)
Access Code: [email or web address] URL: [email or web address] Date: 10/31/2019 Prepared by: Madelyn Flavors  Exercises Seated Long Arc Quad - 10 reps - 2 sets - 5 hold - 3x daily - 7x weekly Seated March - 10 reps - 3 sets - 3x daily - 7x weekly Seated Hamstring Stretch - 3 reps - 1 sets - 20 hold - 3x daily - 7x weekly Correct Standing Posture - 10 reps - 3 sets - 1x daily - 7x weekly Seated Hip Abduction with Resistance - 10 reps - 2 sets - 1x daily - 7x weekly Heel rises with counter support - 10 reps - 2 sets - 1x daily - 7x weekly Standing Shoulder Row with Anchored Resistance - 10 reps - 3 sets - 1x daily - 3x weekly Shoulder extension with resistance - Neutral - 10 reps - 3 sets - 1x daily - 3x weekly

## 2019-11-02 ENCOUNTER — Ambulatory Visit: Payer: Medicare Other | Admitting: Physical Therapy

## 2019-11-16 ENCOUNTER — Other Ambulatory Visit: Payer: Self-pay | Admitting: Cardiology

## 2019-11-16 ENCOUNTER — Ambulatory Visit (INDEPENDENT_AMBULATORY_CARE_PROVIDER_SITE_OTHER): Payer: Medicare Other | Admitting: Specialist

## 2019-11-16 ENCOUNTER — Other Ambulatory Visit: Payer: Self-pay

## 2019-11-16 ENCOUNTER — Encounter: Payer: Self-pay | Admitting: Specialist

## 2019-11-16 VITALS — BP 113/62 | HR 62 | Ht 60.0 in | Wt 183.0 lb

## 2019-11-16 DIAGNOSIS — I7090 Unspecified atherosclerosis: Secondary | ICD-10-CM

## 2019-11-16 DIAGNOSIS — I4819 Other persistent atrial fibrillation: Secondary | ICD-10-CM | POA: Diagnosis not present

## 2019-11-16 DIAGNOSIS — M4807 Spinal stenosis, lumbosacral region: Secondary | ICD-10-CM | POA: Diagnosis not present

## 2019-11-16 DIAGNOSIS — M5417 Radiculopathy, lumbosacral region: Secondary | ICD-10-CM | POA: Diagnosis not present

## 2019-11-16 DIAGNOSIS — M4316 Spondylolisthesis, lumbar region: Secondary | ICD-10-CM | POA: Diagnosis not present

## 2019-11-16 DIAGNOSIS — M8580 Other specified disorders of bone density and structure, unspecified site: Secondary | ICD-10-CM

## 2019-11-16 DIAGNOSIS — Z780 Asymptomatic menopausal state: Secondary | ICD-10-CM

## 2019-11-16 DIAGNOSIS — M4846XA Fatigue fracture of vertebra, lumbar region, initial encounter for fracture: Secondary | ICD-10-CM | POA: Diagnosis not present

## 2019-11-16 DIAGNOSIS — M48062 Spinal stenosis, lumbar region with neurogenic claudication: Secondary | ICD-10-CM

## 2019-11-16 NOTE — Patient Instructions (Addendum)
Avoid bending, stooping and avoid lifting weights greater than 10 lbs. Avoid prolong standing and walking. Order for a new walker with wheels. Surgery scheduling secretary Kandice Hams, will call you in the next week to schedule for surgery.  Surgery recommended is a two level lumbar fusion L3-4 and L4-5 this would be done with rods, screws and cages with local bone graft and allograft (donor bone graft). Take hydrocodone for for pain. Risk of surgery includes risk of infection 1 in 200 patients, bleeding 1/2% chance you would need a transfusion.   Risk to the nerves is one in 10,000. You will need to use a brace for 3 months and wean from the brace on the 4th month. Expect improved walking and standing tolerance. Expect relief of leg pain but numbness may persist depending on the length and degree of pressure that has been present.  Bone Density testing by Dr. Pennie Banter office preop to help with decision making concerning the use of screws in bone and possible cement Augmentation of the screws.

## 2019-11-16 NOTE — Progress Notes (Addendum)
Office Visit Note   Patient: Sara Frye           Date of Birth: 09-Aug-1929           MRN: [id number] Visit Date: 11/16/2019              Requested by: Deland Pretty, MD [address] [place],  [place] [address] PCP: Deland Pretty, MD   Assessment & Plan: Visit Diagnoses:  1. Osteopenia after menopause   2. Fatigue fracture of vertebra, lumbar region, initial encounter for fracture    3. Spinal stenosis of lumbosacral region   4. Spinal stenosis of lumbar region with neurogenic claudication   5. Spondylolisthesis, lumbar region   6. Spondylolisthesis of lumbar region   7. Lumbosacral radiculopathy   8. Atherosclerotic vascular disease   9. Persistent atrial fibrillation (HCC)     Plan: Avoid bending, stooping and avoid lifting weights greater than 10 lbs. Avoid prolong standing and walking. Order for a new walker with wheels. Surgery scheduling secretary Kandice Hams, will call you in the next week to schedule for surgery.  Surgery recommended is a two level lumbar fusion L3-4 and L4-5 this would be done with rods, screws and cages with local bone graft and allograft (donor bone graft). Take hydrocodone for for pain. Risk of surgery includes risk of infection 1 in 200 patients, bleeding 1/2% chance you would need a transfusion.   Risk to the nerves is one in 10,000. You will need to use a brace for 3 months and wean from the brace on the 4th month. Expect improved walking and standing tolerance. Expect relief of leg pain but numbness may persist depending on the length and degree of pressure that has been present.  Bone Density testing by Dr. Pennie Banter office preop to help with decision making concerning the use of screws in bone and possible cement Augmentation of the screws.   Follow-Up Instructions: Return in about 4 weeks (around 12/14/2019).   Orders:  Orders Placed This Encounter  Procedures  . DG BONE DENSITY (DXA)   No orders of the  defined types were placed in this encounter.     Procedures: No procedures performed   Clinical Data: Findings:  CLINICAL DATA:  Low back pain  EXAM: MRI LUMBAR SPINE WITHOUT CONTRAST  TECHNIQUE: Multiplanar, multisequence MR imaging of the lumbar spine was performed. No intravenous contrast was administered.  COMPARISON:  None.  FINDINGS: Segmentation:  Standard.  Alignment: Grade 1 anterolisthesis at L3-L4 and L4-L5. Trace retrolisthesis at L1-L2 and L2-L3  Vertebrae: Vertebral body heights are maintained apart from mild degenerative endplate irregularity and Schmorl's nodes. There is a vertebral body hemangioma compass sing the majority of T12. Additional hemangioma is partially imaged at T9. There is no aggressive osseous lesion. Mild degenerative endplate marrow edema is present primarily at L4-L5 and L5-S1.  Conus medullaris and cauda equina: Conus extends to the L1-L2 level. Conus and cauda equina appear normal.  Paraspinal and other soft tissues: Unremarkable.  Disc levels:  T12-L1: Very small right central disc protrusion and annular fissure superimposed on mild disc bulge. No canal stenosis. Mild foraminal stenosis.  L1-L2: Disc bulge and facet arthropathy with ligamentum flavum infolding. Minor canal and mild foraminal stenosis.  L2-L3: Disc bulge eccentric to the left and facet arthropathy with ligamentum flavum infolding. Minor canal stenosis. Mild foraminal stenosis, left greater than right.  L3-L4: Disc bulge and marked facet arthropathy ligamentum flavum infolding. Mild to moderate canal stenosis with partial  effacement the lateral recesses. Mild foraminal stenosis.  L4-L5: Disc bulge and marked facet arthropathy with ligamentum flavum infolding. Severe canal stenosis with effacement of the lateral recesses. Moderate foraminal stenosis.  L5-S1: Disc bulge and mild right and moderate left facet arthropathy with ligamentum  flavum infolding. No significant canal stenosis. Mild foraminal stenosis.  IMPRESSION: Multilevel degenerative changes as detailed above. There is severe canal stenosis at L4-L5. Foraminal stenosis is also greatest at this level.   Electronically Signed   By: Macy Mis M.D.   On: 10/20/2019 08:10    Subjective: Chief Complaint  Patient presents with  . Lower Back - Follow-up    MRI Review Lumbar    86  Year old female with history of lower lumbar pain and difficulty with standing and walking. She has nocturia and has been having increased number of times getting up at night. She still has trouble with bowel movements with accidents and incontinence and with Urgency of bladder. Stopping evening med helped some. Pain is mainly is back and does not seem to be leg radiation except rarely.  She feels wobbly getting up from bed.    Review of Systems  Constitutional: Negative.  Negative for activity change, appetite change, chills, diaphoresis, fatigue, fever and unexpected weight change.  HENT: Negative.  Negative for congestion, dental problem, drooling, ear discharge, ear pain, facial swelling, hearing loss, mouth sores, nosebleeds, postnasal drip, rhinorrhea, sinus pressure, sinus pain, sneezing, sore throat, tinnitus, trouble swallowing and voice change.   Eyes: Negative.  Negative for photophobia, pain, discharge, redness, itching and visual disturbance.  Respiratory: Negative.  Negative for apnea, cough, choking, chest tightness, shortness of breath, wheezing and stridor.   Cardiovascular: Negative for chest pain, palpitations and leg swelling.  Gastrointestinal: Positive for diarrhea. Negative for abdominal distention, abdominal pain, anal bleeding, blood in stool, constipation, nausea, rectal pain and vomiting.  Endocrine: Negative for cold intolerance, heat intolerance, polydipsia, polyphagia and polyuria.  Genitourinary: Positive for enuresis and urgency. Negative for  difficulty urinating, dyspareunia, dysuria, flank pain, frequency, genital sores and hematuria.  Musculoskeletal: Positive for back pain and gait problem. Negative for arthralgias, joint swelling, myalgias, neck pain and neck stiffness.  Skin: Negative.  Negative for color change, pallor, rash and wound.  Allergic/Immunologic: Negative.  Negative for environmental allergies, food allergies and immunocompromised state.  Neurological: Positive for numbness. Negative for dizziness, tremors, seizures, syncope, facial asymmetry, speech difficulty, weakness, light-headedness and headaches.  Hematological: Negative.  Negative for adenopathy. Does not bruise/bleed easily.  Psychiatric/Behavioral: Negative for agitation, behavioral problems, decreased concentration, dysphoric mood, hallucinations, self-injury, sleep disturbance and suicidal ideas. The patient is not nervous/anxious and is not hyperactive.      Objective: Vital Signs: BP 113/62 (BP Location: Left Arm, Patient Position: Sitting)   Pulse 62   Ht 5' (1.524 m)   Wt 183 lb (83 kg)   BMI 35.74 kg/m   Physical Exam Constitutional:      Appearance: She is well-developed.  HENT:     Head: Normocephalic and atraumatic.  Eyes:     Pupils: Pupils are equal, round, and reactive to light.  Pulmonary:     Effort: Pulmonary effort is normal.     Breath sounds: Normal breath sounds.  Abdominal:     General: Bowel sounds are normal.     Palpations: Abdomen is soft.  Musculoskeletal:     Cervical back: Normal range of motion and neck supple.     Lumbar back: Negative right straight leg raise test  and negative left straight leg raise test.  Skin:    General: Skin is warm and dry.  Neurological:     Mental Status: She is alert and oriented to person, place, and time.  Psychiatric:        Behavior: Behavior normal.        Thought Content: Thought content normal.        Judgment: Judgment normal.     Back Exam   Tenderness  The  patient is experiencing tenderness in the lumbar.  Range of Motion  Extension: abnormal  Flexion: abnormal  Lateral bend right: abnormal  Lateral bend left: abnormal  Rotation right: abnormal  Rotation left: abnormal   Muscle Strength  Right Quadriceps:  5/5  Left Quadriceps:  5/5  Right Hamstrings:  5/5  Left Hamstrings:  5/5   Tests  Straight leg raise right: negative Straight leg raise left: negative  Reflexes  Patellar: 0/4 Achilles: 0/4 Babinski's sign: normal   Other  Toe walk: abnormal Heel walk: abnormal Erythema: no back redness Scars: absent      Specialty Comments:  No specialty comments available.  Imaging: No results found.   PMFS History: Patient Active Problem List   Diagnosis Date Noted  . BPPV (benign paroxysmal positional vertigo) 04/16/2017  . History of stroke 03/09/2017  . H/O cerebral aneurysm repair ACA S/P Coiling by Dr. Estanislado Pandy 01/07/17 01/07/2017  . Chronic anticoagulation 04/16/2016  . Speech disturbance 12/17/2015  . Atrial fibrillation ([place]) 12/17/2015  . Hypertension 12/17/2015  . Hyperlipidemia 12/17/2015  . Dizziness 12/17/2015  . Stroke (cerebrum) [facility])    Past Medical History:  Diagnosis Date  . Arthritis    spine  . Atrial fibrillation ([place])   . Brain aneurysm 01/07/2017  . Dysrhythmia    afib  . H/O cerebral aneurysm repair ACA S/P Coiling by Dr. Estanislado Pandy 01/07/17 01/07/2017  . Headache    with Eliquis  . Hypertension   . Hypothyroidism   . Maternal blood transfusion   . Neuromuscular disorder ([place])    spinal injury- "I'm unsure of walking, I'm very careful"  . Shingles   . Stroke [facility]) 11/2015    Family History  Problem Relation Age of Onset  . Cancer Mother   . Breast cancer Sister   . Heart failure Brother        deceased age 45  . Cancer Brother   . Cancer Brother   . Cancer Sister     Past Surgical History:  Procedure Laterality Date  . ABDOMINAL HYSTERECTOMY    . APPENDECTOMY    . CARPAL  TUNNEL RELEASE Bilateral   . CHOLECYSTECTOMY    . IR ANGIO INTRA EXTRACRAN SEL COM CAROTID INNOMINATE BILAT MOD SED  12/30/2016  . IR ANGIO INTRA EXTRACRAN SEL COM CAROTID INNOMINATE BILAT MOD SED  03/08/2018  . IR ANGIO INTRA EXTRACRAN SEL INTERNAL CAROTID UNI R MOD SED  01/07/2017  . IR ANGIO VERTEBRAL SEL SUBCLAVIAN INNOMINATE UNI R MOD SED  12/30/2016  . IR ANGIO VERTEBRAL SEL VERTEBRAL BILAT MOD SED  03/08/2018  . IR ANGIO VERTEBRAL SEL VERTEBRAL UNI L MOD SED  12/30/2016  . IR ANGIOGRAM FOLLOW UP STUDY  01/07/2017  . IR ANGIOGRAM FOLLOW UP STUDY  01/07/2017  . IR ANGIOGRAM FOLLOW UP STUDY  01/07/2017  . IR NEURO EACH ADD'L AFTER BASIC UNI RIGHT (MS)  01/07/2017  . IR RADIOLOGIST EVAL & MGMT  11/24/2016  . IR RADIOLOGIST EVAL & MGMT  01/25/2017  . IR TRANSCATH/EMBOLIZ  01/07/2017  . JOINT REPLACEMENT Bilateral    arthroscopic - knees  . RADIOLOGY WITH ANESTHESIA N/A 12/30/2016   Procedure: RADIOLOGY WITH ANESTHESIA  EMBOLIZATION;  Surgeon: Luanne Bras, MD;  Location: [place];  Service: Radiology;  Laterality: N/A;  . RADIOLOGY WITH ANESTHESIA N/A 01/07/2017   Procedure: EMBOLIZATION;  Surgeon: Luanne Bras, MD;  Location: [place];  Service: Radiology;  Laterality: N/A;   Social History   Occupational History  . Not on file  Tobacco Use  . Smoking status: Never Smoker  . Smokeless tobacco: Never Used  Substance and Sexual Activity  . Alcohol use: No  . Drug use: No  . Sexual activity: Not on file

## 2019-11-28 ENCOUNTER — Encounter: Payer: Medicare Other | Admitting: Physical Therapy

## 2019-12-14 ENCOUNTER — Encounter: Payer: Self-pay | Admitting: Specialist

## 2019-12-14 ENCOUNTER — Other Ambulatory Visit: Payer: Self-pay

## 2019-12-14 ENCOUNTER — Ambulatory Visit (INDEPENDENT_AMBULATORY_CARE_PROVIDER_SITE_OTHER): Payer: Medicare Other | Admitting: Specialist

## 2019-12-14 ENCOUNTER — Ambulatory Visit: Payer: Self-pay

## 2019-12-14 VITALS — BP 124/65 | HR 67 | Ht 60.0 in | Wt 183.0 lb

## 2019-12-14 DIAGNOSIS — M8580 Other specified disorders of bone density and structure, unspecified site: Secondary | ICD-10-CM | POA: Diagnosis not present

## 2019-12-14 DIAGNOSIS — M4807 Spinal stenosis, lumbosacral region: Secondary | ICD-10-CM | POA: Diagnosis not present

## 2019-12-14 DIAGNOSIS — M4316 Spondylolisthesis, lumbar region: Secondary | ICD-10-CM

## 2019-12-14 DIAGNOSIS — Z780 Asymptomatic menopausal state: Secondary | ICD-10-CM

## 2019-12-14 DIAGNOSIS — M4846XA Fatigue fracture of vertebra, lumbar region, initial encounter for fracture: Secondary | ICD-10-CM

## 2019-12-14 DIAGNOSIS — R32 Unspecified urinary incontinence: Secondary | ICD-10-CM | POA: Diagnosis not present

## 2019-12-14 NOTE — Progress Notes (Signed)
Office Visit Note   Patient: Sara Frye           Date of Birth: February 11, 1929           MRN: [id number] Visit Date: 12/14/2019              Requested by: Deland Pretty, MD [address] [place],  [place] [address] PCP: Deland Pretty, MD   Assessment & Plan: Visit Diagnoses:  1. Fatigue fracture of vertebra, lumbar region, initial encounter for fracture   2. Osteopenia after menopause   3. Spinal stenosis of lumbosacral region   4. Incontinence in female   5. Spondylolisthesis, lumbar region     Plan: Avoid bending, stooping and avoid lifting weights greater than 10 lbs. Avoid prolong standing and walking. Avoid frequent bending and stooping  No lifting greater than 10 lbs. May use ice or moist heat for pain. Weight loss is of benefit. Handicap license is approved. Dr. Romona Curls secretary/Assistant will call to arrange for epidural steroid injection   Follow-Up Instructions: Return in about 4 weeks (around 01/11/2020).   Orders:  Orders Placed This Encounter  Procedures  . XR Lumbar Spine 2-3 Views  . Ambulatory referral to Urology  . Ambulatory referral to Physical Medicine Rehab   No orders of the defined types were placed in this encounter.     Procedures: No procedures performed   Clinical Data: No additional findings.   Subjective: Chief Complaint  Patient presents with  . Lower Back - Follow-up    86 year old  Female with history of compression fracture of the lumbar spine and anterolisthesis at the L4-5 level. She reports she is having less pain but still is experiencing difficulty with walking any distance, off balance and falls very easy, last fell about 2 days ago. She was taking coffee water to the [employer] coffee maker and she fell. She has difficulty getting off the floor and her husband has to help her. He gets a Museum/gallery curator and she pulls herself up. She reports pain into the lateral right calf and into the top of the right foot. Pain  at night and with standing and walking. She has difficulty lifting herself out of a chair. Sitting too long has difficulty getting up. Her bowel and bladder with diarrhea and incontinence of bowel. Chocolate give trouble, sugar free is recommended but she has difficulty with this. She has sensation to go to the bathroom. Also incontinent of bladder. She has not been to the urologist. Has had hemorrhoids and bleeds intermittantly. No urology testing. She is leaning and stooping and holding onto items. She mops the floor.    Review of Systems  Constitutional: Negative.  Negative for activity change, appetite change, chills, diaphoresis, fatigue, fever and unexpected weight change.  HENT: Negative for congestion, dental problem, drooling, ear discharge, ear pain, facial swelling, hearing loss, mouth sores, nosebleeds, postnasal drip, rhinorrhea, sinus pressure, sinus pain, sneezing, sore throat, tinnitus, trouble swallowing and voice change.   Eyes: Positive for visual disturbance (uses reading glasses astigmatism.). Negative for photophobia, pain, discharge, redness and itching.  Respiratory: Positive for shortness of breath. Negative for apnea, cough, choking, chest tightness, wheezing and stridor.   Cardiovascular: Negative for chest pain, palpitations and leg swelling.  Gastrointestinal: Positive for anal bleeding, blood in stool and diarrhea. Negative for abdominal distention, abdominal pain, constipation, nausea, rectal pain and vomiting.  Endocrine: Negative for cold intolerance, heat intolerance, polydipsia, polyphagia and polyuria.  Genitourinary: Positive for enuresis, frequency  and urgency. Negative for difficulty urinating, dyspareunia, dysuria, flank pain, genital sores, hematuria, menstrual problem and pelvic pain.  Musculoskeletal: Positive for back pain. Negative for arthralgias, gait problem, joint swelling, myalgias, neck pain and neck stiffness.  Skin: Positive for rash. Negative for  color change, pallor and wound.  Allergic/Immunologic: Negative.  Negative for environmental allergies, food allergies and immunocompromised state.  Neurological: Positive for weakness and numbness. Negative for dizziness, tremors, seizures, syncope, facial asymmetry, speech difficulty, light-headedness and headaches.  Hematological: Negative.  Negative for adenopathy. Does not bruise/bleed easily.  Psychiatric/Behavioral: Negative for agitation, behavioral problems, confusion, decreased concentration, hallucinations, self-injury, sleep disturbance and suicidal ideas. The patient is not nervous/anxious and is not hyperactive.      Objective: Vital Signs: BP 124/65 (BP Location: Left Arm, Patient Position: Sitting)   Pulse 67   Ht 5' (1.524 m)   Wt 183 lb (83 kg)   BMI 35.74 kg/m   Physical Exam Constitutional:      Appearance: She is well-developed.  HENT:     Head: Normocephalic and atraumatic.  Eyes:     Pupils: Pupils are equal, round, and reactive to light.  Pulmonary:     Effort: Pulmonary effort is normal.     Breath sounds: Normal breath sounds.  Abdominal:     General: Bowel sounds are normal.     Palpations: Abdomen is soft.  Musculoskeletal:     Cervical back: Normal range of motion and neck supple.     Lumbar back: Negative right straight leg raise test and negative left straight leg raise test.  Skin:    General: Skin is warm and dry.  Neurological:     Mental Status: She is alert and oriented to person, place, and time.  Psychiatric:        Behavior: Behavior normal.        Thought Content: Thought content normal.        Judgment: Judgment normal.     Back Exam   Tenderness  The patient is experiencing tenderness in the lumbar.  Range of Motion  Extension: abnormal  Flexion: normal  Lateral bend right: abnormal  Lateral bend left: abnormal  Rotation right: abnormal  Rotation left: abnormal   Muscle Strength  Right Quadriceps:  5/5  Left  Quadriceps:  5/5  Right Hamstrings:  5/5  Left Hamstrings:  5/5   Tests  Straight leg raise right: negative Straight leg raise left: negative  Reflexes  Patellar: 0/4 Achilles: 0/4 Biceps: 0/4 Babinski's sign: normal   Other  Toe walk: normal Heel walk: normal Sensation: decreased Erythema: no back redness Scars: absent  Comments:  SLR is mildly positive on the right. EHL is 4/5 right and Right foot DF is 4+/5  Walks with right leg limp.       Specialty Comments:  No specialty comments available.  Imaging: No results found.   PMFS History: Patient Active Problem List   Diagnosis Date Noted  . BPPV (benign paroxysmal positional vertigo) 04/16/2017  . History of stroke 03/09/2017  . H/O cerebral aneurysm repair ACA S/P Coiling by Dr. Estanislado Pandy 01/07/17 01/07/2017  . Chronic anticoagulation 04/16/2016  . Speech disturbance 12/17/2015  . Atrial fibrillation ([place]) 12/17/2015  . Hypertension 12/17/2015  . Hyperlipidemia 12/17/2015  . Dizziness 12/17/2015  . Stroke (cerebrum) [facility])    Past Medical History:  Diagnosis Date  . Arthritis    spine  . Atrial fibrillation ([place])   . Brain aneurysm 01/07/2017  . Dysrhythmia  afib  . H/O cerebral aneurysm repair ACA S/P Coiling by Dr. Estanislado Pandy 01/07/17 01/07/2017  . Headache    with Eliquis  . Hypertension   . Hypothyroidism   . Maternal blood transfusion   . Neuromuscular disorder ([place])    spinal injury- "I'm unsure of walking, I'm very careful"  . Shingles   . Stroke [facility]) 11/2015    Family History  Problem Relation Age of Onset  . Cancer Mother   . Breast cancer Sister   . Heart failure Brother        deceased age 30  . Cancer Brother   . Cancer Brother   . Cancer Sister     Past Surgical History:  Procedure Laterality Date  . ABDOMINAL HYSTERECTOMY    . APPENDECTOMY    . CARPAL TUNNEL RELEASE Bilateral   . CHOLECYSTECTOMY    . IR ANGIO INTRA EXTRACRAN SEL COM CAROTID INNOMINATE BILAT MOD SED   12/30/2016  . IR ANGIO INTRA EXTRACRAN SEL COM CAROTID INNOMINATE BILAT MOD SED  03/08/2018  . IR ANGIO INTRA EXTRACRAN SEL INTERNAL CAROTID UNI R MOD SED  01/07/2017  . IR ANGIO VERTEBRAL SEL SUBCLAVIAN INNOMINATE UNI R MOD SED  12/30/2016  . IR ANGIO VERTEBRAL SEL VERTEBRAL BILAT MOD SED  03/08/2018  . IR ANGIO VERTEBRAL SEL VERTEBRAL UNI L MOD SED  12/30/2016  . IR ANGIOGRAM FOLLOW UP STUDY  01/07/2017  . IR ANGIOGRAM FOLLOW UP STUDY  01/07/2017  . IR ANGIOGRAM FOLLOW UP STUDY  01/07/2017  . IR NEURO EACH ADD'L AFTER BASIC UNI RIGHT (MS)  01/07/2017  . IR RADIOLOGIST EVAL & MGMT  11/24/2016  . IR RADIOLOGIST EVAL & MGMT  01/25/2017  . IR TRANSCATH/EMBOLIZ  01/07/2017  . JOINT REPLACEMENT Bilateral    arthroscopic - knees  . RADIOLOGY WITH ANESTHESIA N/A 12/30/2016   Procedure: RADIOLOGY WITH ANESTHESIA  EMBOLIZATION;  Surgeon: Luanne Bras, MD;  Location: [place];  Service: Radiology;  Laterality: N/A;  . RADIOLOGY WITH ANESTHESIA N/A 01/07/2017   Procedure: EMBOLIZATION;  Surgeon: Luanne Bras, MD;  Location: [place];  Service: Radiology;  Laterality: N/A;   Social History   Occupational History  . Not on file  Tobacco Use  . Smoking status: Never Smoker  . Smokeless tobacco: Never Used  Substance and Sexual Activity  . Alcohol use: No  . Drug use: No  . Sexual activity: Not on file

## 2019-12-14 NOTE — Patient Instructions (Addendum)
Avoid bending, stooping and avoid lifting weights greater than 10 lbs. Avoid prolong standing and walking. Avoid frequent bending and stooping  No lifting greater than 10 lbs. May use ice or moist heat for pain. Weight loss is of benefit. Handicap license is approved. Dr. Romona Curls secretary/Assistant will call to arrange for epidural steroid injection Urology consult.

## 2019-12-27 DIAGNOSIS — I10 Essential (primary) hypertension: Secondary | ICD-10-CM | POA: Diagnosis not present

## 2019-12-27 DIAGNOSIS — E039 Hypothyroidism, unspecified: Secondary | ICD-10-CM | POA: Diagnosis not present

## 2019-12-27 DIAGNOSIS — E7800 Pure hypercholesterolemia, unspecified: Secondary | ICD-10-CM | POA: Diagnosis not present

## 2020-01-03 DIAGNOSIS — E039 Hypothyroidism, unspecified: Secondary | ICD-10-CM | POA: Diagnosis not present

## 2020-01-03 DIAGNOSIS — M545 Low back pain: Secondary | ICD-10-CM | POA: Diagnosis not present

## 2020-01-03 DIAGNOSIS — Z6836 Body mass index (BMI) 36.0-36.9, adult: Secondary | ICD-10-CM | POA: Diagnosis not present

## 2020-01-03 DIAGNOSIS — N761 Subacute and chronic vaginitis: Secondary | ICD-10-CM | POA: Diagnosis not present

## 2020-01-03 DIAGNOSIS — K625 Hemorrhage of anus and rectum: Secondary | ICD-10-CM | POA: Diagnosis not present

## 2020-01-03 DIAGNOSIS — E7800 Pure hypercholesterolemia, unspecified: Secondary | ICD-10-CM | POA: Diagnosis not present

## 2020-01-03 DIAGNOSIS — I4891 Unspecified atrial fibrillation: Secondary | ICD-10-CM | POA: Diagnosis not present

## 2020-01-03 DIAGNOSIS — Z0000 Encounter for general adult medical examination without abnormal findings: Secondary | ICD-10-CM | POA: Diagnosis not present

## 2020-01-03 DIAGNOSIS — R159 Full incontinence of feces: Secondary | ICD-10-CM | POA: Diagnosis not present

## 2020-01-03 DIAGNOSIS — Z8679 Personal history of other diseases of the circulatory system: Secondary | ICD-10-CM | POA: Diagnosis not present

## 2020-01-03 DIAGNOSIS — Z8673 Personal history of transient ischemic attack (TIA), and cerebral infarction without residual deficits: Secondary | ICD-10-CM | POA: Diagnosis not present

## 2020-01-03 DIAGNOSIS — R32 Unspecified urinary incontinence: Secondary | ICD-10-CM | POA: Diagnosis not present

## 2020-01-04 ENCOUNTER — Encounter: Payer: Medicare Other | Admitting: Physical Medicine and Rehabilitation

## 2020-01-05 ENCOUNTER — Telehealth: Payer: Self-pay | Admitting: Physical Medicine and Rehabilitation

## 2020-01-05 NOTE — Telephone Encounter (Signed)
Cancelling appt for pt for 01/15/20

## 2020-01-05 NOTE — Telephone Encounter (Signed)
Patient's daughter called stating that the Patient's PCP wants her to get a 2nd opinion and they want to cancel this appointment.  Thank you.

## 2020-01-10 ENCOUNTER — Ambulatory Visit: Payer: Medicare Other | Admitting: Specialist

## 2020-01-10 DIAGNOSIS — R195 Other fecal abnormalities: Secondary | ICD-10-CM | POA: Diagnosis not present

## 2020-01-10 DIAGNOSIS — R159 Full incontinence of feces: Secondary | ICD-10-CM | POA: Diagnosis not present

## 2020-01-10 DIAGNOSIS — K625 Hemorrhage of anus and rectum: Secondary | ICD-10-CM | POA: Diagnosis not present

## 2020-01-11 DIAGNOSIS — N898 Other specified noninflammatory disorders of vagina: Secondary | ICD-10-CM | POA: Diagnosis not present

## 2020-01-11 DIAGNOSIS — B0229 Other postherpetic nervous system involvement: Secondary | ICD-10-CM | POA: Diagnosis not present

## 2020-01-11 DIAGNOSIS — B373 Candidiasis of vulva and vagina: Secondary | ICD-10-CM | POA: Diagnosis not present

## 2020-01-11 DIAGNOSIS — Z01419 Encounter for gynecological examination (general) (routine) without abnormal findings: Secondary | ICD-10-CM | POA: Diagnosis not present

## 2020-01-15 ENCOUNTER — Encounter: Payer: Medicare Other | Admitting: Physical Medicine and Rehabilitation

## 2020-01-16 DIAGNOSIS — M4316 Spondylolisthesis, lumbar region: Secondary | ICD-10-CM | POA: Diagnosis not present

## 2020-01-16 DIAGNOSIS — M48062 Spinal stenosis, lumbar region with neurogenic claudication: Secondary | ICD-10-CM | POA: Diagnosis not present

## 2020-01-18 ENCOUNTER — Encounter: Payer: Self-pay | Admitting: Cardiology

## 2020-01-19 DIAGNOSIS — B373 Candidiasis of vulva and vagina: Secondary | ICD-10-CM | POA: Diagnosis not present

## 2020-03-06 DIAGNOSIS — I2510 Atherosclerotic heart disease of native coronary artery without angina pectoris: Secondary | ICD-10-CM | POA: Diagnosis not present

## 2020-03-21 DIAGNOSIS — R350 Frequency of micturition: Secondary | ICD-10-CM | POA: Diagnosis not present

## 2020-03-21 DIAGNOSIS — N3946 Mixed incontinence: Secondary | ICD-10-CM | POA: Diagnosis not present

## 2020-03-21 DIAGNOSIS — R351 Nocturia: Secondary | ICD-10-CM | POA: Diagnosis not present

## 2020-04-04 ENCOUNTER — Ambulatory Visit: Payer: Medicare Other | Admitting: Cardiology

## 2020-04-14 ENCOUNTER — Other Ambulatory Visit: Payer: Self-pay | Admitting: Cardiology

## 2020-04-16 ENCOUNTER — Other Ambulatory Visit (HOSPITAL_COMMUNITY): Payer: Self-pay | Admitting: Interventional Radiology

## 2020-04-16 DIAGNOSIS — I671 Cerebral aneurysm, nonruptured: Secondary | ICD-10-CM

## 2020-04-19 ENCOUNTER — Ambulatory Visit: Payer: Medicare Other | Admitting: Cardiology

## 2020-04-19 ENCOUNTER — Encounter: Payer: Self-pay | Admitting: Cardiology

## 2020-04-19 ENCOUNTER — Other Ambulatory Visit: Payer: Self-pay

## 2020-04-19 VITALS — BP 126/62 | HR 70 | Resp 16 | Ht 60.0 in | Wt 182.0 lb

## 2020-04-19 DIAGNOSIS — I480 Paroxysmal atrial fibrillation: Secondary | ICD-10-CM | POA: Diagnosis not present

## 2020-04-19 DIAGNOSIS — I10 Essential (primary) hypertension: Secondary | ICD-10-CM

## 2020-04-19 DIAGNOSIS — E7800 Pure hypercholesterolemia, unspecified: Secondary | ICD-10-CM | POA: Diagnosis not present

## 2020-04-19 NOTE — Progress Notes (Signed)
Primary Physician/Referring:  Deland Pretty, MD  Patient ID: Sara Frye, female    DOB: 1929/04/21, 86 y.o.   MRN: [id number]  Chief Complaint  Patient presents with  . Atrial Fibrillation  . Hypertension  . Follow-up    6 month    HPI: Sara Frye  is a 86 y.o. female  with hypertension, hyperlipidemia,  paroxysmal Afib, bilateral MCA and ACA multifocal punctate infarcts, after which she was started on anticaogulation on 12/18/15. She was also found to have cerebral aneurysm for which she underwent endovascular obliteration of anterior communicating artery aneurysm with stent assisted coiling by Dr. Estanislado Pandy on 01/07/17. She is here for 6 month follow up today.    She has stress fracture of the vertebrae in the back lumbar spinal region and now has developed incontinence, frequent UTI and severe back pain and reduced physical activity.  She has made lifestyle changes and has lost 8 pounds in weight.  Otherwise from cardiac standpoint she is doing well and has occasional hemorrhoidal bleed.  Past Medical History:  Diagnosis Date  . Arthritis    spine  . Atrial fibrillation ([place])   . Brain aneurysm 01/07/2017  . Dysrhythmia    afib  . H/O cerebral aneurysm repair ACA S/P Coiling by Dr. Estanislado Pandy 01/07/17 01/07/2017  . Headache    with Eliquis  . Hypertension   . Hypothyroidism   . Maternal blood transfusion   . Neuromuscular disorder ([place])    spinal injury- "I'm unsure of walking, I'm very careful"  . Shingles   . Stroke [facility]) 11/2015    Past Surgical History:  Procedure Laterality Date  . ABDOMINAL HYSTERECTOMY    . APPENDECTOMY    . CARPAL TUNNEL RELEASE Bilateral   . CHOLECYSTECTOMY    . IR ANGIO INTRA EXTRACRAN SEL COM CAROTID INNOMINATE BILAT MOD SED  12/30/2016  . IR ANGIO INTRA EXTRACRAN SEL COM CAROTID INNOMINATE BILAT MOD SED  03/08/2018  . IR ANGIO INTRA EXTRACRAN SEL INTERNAL CAROTID UNI R MOD SED  01/07/2017  . IR ANGIO VERTEBRAL SEL SUBCLAVIAN  INNOMINATE UNI R MOD SED  12/30/2016  . IR ANGIO VERTEBRAL SEL VERTEBRAL BILAT MOD SED  03/08/2018  . IR ANGIO VERTEBRAL SEL VERTEBRAL UNI L MOD SED  12/30/2016  . IR ANGIOGRAM FOLLOW UP STUDY  01/07/2017  . IR ANGIOGRAM FOLLOW UP STUDY  01/07/2017  . IR ANGIOGRAM FOLLOW UP STUDY  01/07/2017  . IR NEURO EACH ADD'L AFTER BASIC UNI RIGHT (MS)  01/07/2017  . IR RADIOLOGIST EVAL & MGMT  11/24/2016  . IR RADIOLOGIST EVAL & MGMT  01/25/2017  . IR TRANSCATH/EMBOLIZ  01/07/2017  . JOINT REPLACEMENT Bilateral    arthroscopic - knees  . RADIOLOGY WITH ANESTHESIA N/A 12/30/2016   Procedure: RADIOLOGY WITH ANESTHESIA  EMBOLIZATION;  Surgeon: Luanne Bras, MD;  Location: [place];  Service: Radiology;  Laterality: N/A;  . RADIOLOGY WITH ANESTHESIA N/A 01/07/2017   Procedure: EMBOLIZATION;  Surgeon: Luanne Bras, MD;  Location: [place];  Service: Radiology;  Laterality: N/A;   Social History   Tobacco Use  . Smoking status: Never Smoker  . Smokeless tobacco: Never Used  Substance Use Topics  . Alcohol use: No    Review of Systems  Cardiovascular: Positive for claudication and dyspnea on exertion (chronic). Negative for leg swelling.  Musculoskeletal: Positive for back pain, joint pain and muscle cramps.  Gastrointestinal: Positive for hemorrhoids (no recent bleed). Negative for melena.  Genitourinary: Positive for bladder incontinence, frequency  and urgency.   Objective  Blood pressure 126/62, pulse 70, resp. rate 16, height 5' (1.524 m), weight 182 lb (82.6 kg), SpO2 100 %. Body mass index is 35.54 kg/m. Vitals with BMI 04/19/2020 12/14/2019 11/16/2019  Height 5\' 0"  5\' 0"  5\' 0"   Weight 182 lbs 183 lbs 183 lbs  BMI 35.54 03.47 42.59  Systolic 563 875 643  Diastolic 62 65 62  Pulse 70 67 62     Physical Exam Constitutional:      Comments: Moderately obese  Neck:     Vascular: No JVD.  Cardiovascular:     Rate and Rhythm: Normal rate and regular rhythm.     Pulses:          Carotid pulses are 2+  on the right side and 2+ on the left side.      Femoral pulses are 2+ on the right side and 2+ on the left side.      Popliteal pulses are 1+ on the right side and 1+ on the left side.       Dorsalis pedis pulses are 1+ on the right side and 1+ on the left side.       Posterior tibial pulses are 0 on the right side and 0 on the left side.     Heart sounds: Normal heart sounds. No murmur heard.  No gallop.   Pulmonary:     Effort: Pulmonary effort is normal.     Breath sounds: Normal breath sounds.  Abdominal:     General: Bowel sounds are normal.     Palpations: Abdomen is soft.     Comments: Obese    Radiology: No results found.  Laboratory examination:   CMP Latest Ref Rng & Units 10/09/2019 04/18/2019 03/08/2018  Glucose 65 - 99 mg/dL 85 - 100(H)  BUN 8 - 27 mg/dL 18 - 10  Creatinine 0.57 - 1.00 mg/dL 0.84 0.83 0.83  Sodium 134 - 144 mmol/L 144 - 140  Potassium 3.5 - 5.2 mmol/L 5.5(H) - 3.6  Chloride 96 - 106 mmol/L 106 - 105  CO2 20 - 29 mmol/L 25 - 26  Calcium 8.7 - 10.3 mg/dL 10.1 - 9.4  Total Protein 6.0 - 8.5 g/dL 7.2 - -  Total Bilirubin 0.0 - 1.2 mg/dL 0.3 - -  Alkaline Phos 39 - 117 IU/L 61 - -  AST 0 - 40 IU/L 17 - -  ALT 0 - 32 IU/L 15 - -   CBC Latest Ref Rng & Units 10/09/2019 03/08/2018 01/08/2017  WBC 3.4 - 10.8 x10E3/uL 5.7 6.3 8.8  Hemoglobin 11.1 - 15.9 g/dL 12.2 13.7 11.4(L)  Hematocrit 34.0 - 46.6 % 36.6 40.9 32.4(L)  Platelets 150 - 450 x10E3/uL 262 243 197   Lipid Panel     Component Value Date/Time   CHOL 136 10/09/2019 0812   TRIG 75 10/09/2019 0812   HDL 59 10/09/2019 0812   CHOLHDL 3.0 12/17/2015 0436   VLDL 24 12/17/2015 0436   LDLCALC 62 10/09/2019 0812   HEMOGLOBIN A1C Lab Results  Component Value Date   HGBA1C 6.1 (H) 12/17/2015   MPG 128 12/17/2015   TSH Recent Labs    10/09/19 0812  TSH 2.990     External labs 07/11/2018:  Cholesterol, total 133.000 12/15/2017 HDL 61.000 12/15/2017 LDL 57.000 12/15/2017 Triglycerides  111.000 12/22/2018  A1C N/D Hemoglobin 13.700 G/ 07/11/2018, INR 1.080 03/08/2018 Platelets 280.000 X 07/11/2018  Creatinine, Serum 0.840 MG/ 07/11/2018 Potassium 3.600 03/08/2018 ALT (SGPT) 15.000 IU/  07/11/2018 TSH 2.470 12/22/2018  PR219 Meds:. Medications Discontinued During This Encounter  Medication Reason  . mirabegron ER (MYRBETRIQ) 25 MG TB24 tablet Discontinued by provider   Current Meds  Medication Sig  . ALPRAZolam (XANAX) 0.25 MG tablet Take one tablet po at the time of the MRI and may repeat x 1 after 30 minutes.  . Biotin 5000 MCG TABS Take 1 tablet by mouth daily.  . cholecalciferol (VITAMIN D) 1000 units tablet Take 1,000 Units by mouth daily.  Marland Kitchen ELIQUIS 5 MG TABS tablet TAKE 1 TABLET BY MOUTH TWICE DAILY  . furosemide (LASIX) 20 MG tablet TAKE 1 TABLET BY MOUTH DAILY AS NEEDED FOR LEG SWELLING  . gabapentin (NEURONTIN) 100 MG capsule Take 100 mg by mouth 2 (two) times daily.   . hydrochlorothiazide (HYDRODIURIL) 25 MG tablet Take 25 mg by mouth daily.  Marland Kitchen levothyroxine (SYNTHROID, LEVOTHROID) 25 MCG tablet Take 25 mcg by mouth daily.  . meclizine (ANTIVERT) 25 MG tablet Take 12.5 mg by mouth 3 (three) times daily as needed for dizziness.  . metoprolol succinate (TOPROL-XL) 25 MG 24 hr tablet Take 12.5 mg by mouth daily.   . potassium chloride (KLOR-CON) 10 MEQ tablet TAKE 2 TABLETS BY MOUTH DAILY WITH FUROSEMIDE AS NEEDED  . rosuvastatin (CRESTOR) 5 MG tablet Take 0.5 tablets (2.5 mg total) by mouth at bedtime.  . traMADol (ULTRAM) 50 MG tablet TK 1 T PO Q 6 HOURS PRN  . valACYclovir (VALTREX) 1000 MG tablet TK 1 T PO TID FOR 7 DAYS   Radiology 04/18/2019: 1. No residual A-comm aneurysm sac filling, status post flow diverting stent placement and coiling. 2. Chronic ischemic microangiopathy and generalized volume loss. No acute abnormality.  Cardiac Studies:   Echo 12/17/2015 - at [facility]- Moderate LVH, EF-60-65 percent.  Grade 1 diastolic dysfunction with  elevated LV filling pressure.  Mitral annulus calcification.  Severely dilated LA, trace MR.  Status post endovascular obliteration of anterior communicating artery aneurysm with stent assisted coiling by Dr. Estanislado Pandy on 01/07/2017.  EKG 04/19/2020: Normal sinus rhythm at rate of 68 bpm, left atrial enlargement, left axis deviation, left anterior fascicular block.  LVH with repolarization abnormality, cannot exclude lateral ischemia.  Single PVC.  No significant change from 10/06/2019.  Assessment     ICD-10-CM   1. Paroxysmal atrial fibrillation ([place]). CHA2DS2-VASc Score is 5.  Yearly risk of stroke: 6.7% (A, F, HTN, Vasc Dz)  I48.0   2. Essential hypertension  I10 EKG 12-Lead  3. Hypercholesteremia  E78.00      Recommendations:   Sara Frye  is a 87 y.o. female  with hypertension, hyperlipidemia,  paroxysmal Afib, bilateral MCA and ACA multifocal punctate infarcts, after which she was started on anticaogulation on 12/18/15. She was also found to have cerebral aneurysm for which she underwent endovascular obliteration of anterior communicating artery aneurysm with stent assisted coiling by Dr. Estanislado Pandy on 01/07/17. She is here for 6 month follow up today.    She has chronic stable claudication, probably a combination of vascular claudication and also neurogenic claudication, now has severe back pain due to lumbar spinal stress fracture.  I have recommended that if she needs surgery that she should proceed with this as her light duty has become severely reduced and she has not developed urinary incontinence.  Fortunately from cardiac standpoint she is maintaining sinus rhythm, blood pressure is well controlled and lipids are at goal.  We will see her back in 6 months.   Ulice Dash  Einar Gip, MD, [facility] 04/19/2020, 9:24 AM Office: [phone]

## 2020-05-02 DIAGNOSIS — Z1212 Encounter for screening for malignant neoplasm of rectum: Secondary | ICD-10-CM | POA: Diagnosis not present

## 2020-05-02 DIAGNOSIS — Z01419 Encounter for gynecological examination (general) (routine) without abnormal findings: Secondary | ICD-10-CM | POA: Diagnosis not present

## 2020-05-02 DIAGNOSIS — I10 Essential (primary) hypertension: Secondary | ICD-10-CM | POA: Diagnosis not present

## 2020-05-03 ENCOUNTER — Ambulatory Visit (HOSPITAL_COMMUNITY)
Admission: RE | Admit: 2020-05-03 | Discharge: 2020-05-03 | Disposition: A | Discharge status: Home / Self Care | Payer: Medicare Other | Source: Ambulatory Visit | Attending: Interventional Radiology | Admitting: Interventional Radiology

## 2020-05-03 ENCOUNTER — Ambulatory Visit (HOSPITAL_COMMUNITY): Admission: RE | Admit: 2020-05-03 | Payer: Medicare Other | Source: Ambulatory Visit

## 2020-05-03 ENCOUNTER — Other Ambulatory Visit: Payer: Self-pay

## 2020-05-03 DIAGNOSIS — H748X3 Other specified disorders of middle ear and mastoid, bilateral: Secondary | ICD-10-CM | POA: Diagnosis not present

## 2020-05-03 DIAGNOSIS — I671 Cerebral aneurysm, nonruptured: Secondary | ICD-10-CM | POA: Insufficient documentation

## 2020-05-03 DIAGNOSIS — I6621 Occlusion and stenosis of right posterior cerebral artery: Secondary | ICD-10-CM | POA: Diagnosis not present

## 2020-05-06 ENCOUNTER — Telehealth (HOSPITAL_COMMUNITY): Payer: Self-pay

## 2020-05-06 NOTE — Telephone Encounter (Signed)
Pt agreed to f/u in 1 year with mri/mra. AW  

## 2020-05-10 DIAGNOSIS — N3946 Mixed incontinence: Secondary | ICD-10-CM | POA: Diagnosis not present

## 2020-05-10 DIAGNOSIS — R351 Nocturia: Secondary | ICD-10-CM | POA: Diagnosis not present

## 2020-05-10 DIAGNOSIS — R350 Frequency of micturition: Secondary | ICD-10-CM | POA: Diagnosis not present

## 2020-05-24 DIAGNOSIS — N3946 Mixed incontinence: Secondary | ICD-10-CM | POA: Diagnosis not present

## 2020-06-24 DIAGNOSIS — Z23 Encounter for immunization: Secondary | ICD-10-CM | POA: Diagnosis not present

## 2020-07-11 DIAGNOSIS — N3946 Mixed incontinence: Secondary | ICD-10-CM | POA: Diagnosis not present

## 2020-07-30 DIAGNOSIS — R350 Frequency of micturition: Secondary | ICD-10-CM | POA: Diagnosis not present

## 2020-08-06 DIAGNOSIS — R350 Frequency of micturition: Secondary | ICD-10-CM | POA: Diagnosis not present

## 2020-08-20 DIAGNOSIS — R350 Frequency of micturition: Secondary | ICD-10-CM | POA: Diagnosis not present

## 2020-08-27 DIAGNOSIS — R350 Frequency of micturition: Secondary | ICD-10-CM | POA: Diagnosis not present

## 2020-09-03 DIAGNOSIS — R350 Frequency of micturition: Secondary | ICD-10-CM | POA: Diagnosis not present

## 2020-09-17 DIAGNOSIS — R350 Frequency of micturition: Secondary | ICD-10-CM | POA: Diagnosis not present

## 2020-09-17 DIAGNOSIS — N3946 Mixed incontinence: Secondary | ICD-10-CM | POA: Diagnosis not present

## 2020-09-24 DIAGNOSIS — R350 Frequency of micturition: Secondary | ICD-10-CM | POA: Diagnosis not present

## 2020-10-01 DIAGNOSIS — R350 Frequency of micturition: Secondary | ICD-10-CM | POA: Diagnosis not present

## 2020-10-01 DIAGNOSIS — N3946 Mixed incontinence: Secondary | ICD-10-CM | POA: Diagnosis not present

## 2020-10-08 DIAGNOSIS — R350 Frequency of micturition: Secondary | ICD-10-CM | POA: Diagnosis not present

## 2020-10-09 ENCOUNTER — Other Ambulatory Visit: Payer: Self-pay | Admitting: Internal Medicine

## 2020-10-09 DIAGNOSIS — Z0000 Encounter for general adult medical examination without abnormal findings: Secondary | ICD-10-CM

## 2020-10-15 DIAGNOSIS — R350 Frequency of micturition: Secondary | ICD-10-CM | POA: Diagnosis not present

## 2020-10-22 DIAGNOSIS — R350 Frequency of micturition: Secondary | ICD-10-CM | POA: Diagnosis not present

## 2020-10-22 DIAGNOSIS — N3946 Mixed incontinence: Secondary | ICD-10-CM | POA: Diagnosis not present

## 2020-10-23 ENCOUNTER — Other Ambulatory Visit: Payer: Self-pay

## 2020-10-23 ENCOUNTER — Encounter: Payer: Self-pay | Admitting: Cardiology

## 2020-10-23 ENCOUNTER — Ambulatory Visit: Payer: Medicare Other | Admitting: Cardiology

## 2020-10-23 VITALS — BP 135/77 | HR 66 | Temp 97.2°F | Resp 16 | Ht 60.0 in | Wt 182.4 lb

## 2020-10-23 DIAGNOSIS — I480 Paroxysmal atrial fibrillation: Secondary | ICD-10-CM | POA: Diagnosis not present

## 2020-10-23 DIAGNOSIS — I10 Essential (primary) hypertension: Secondary | ICD-10-CM | POA: Diagnosis not present

## 2020-10-23 DIAGNOSIS — R011 Cardiac murmur, unspecified: Secondary | ICD-10-CM

## 2020-10-23 DIAGNOSIS — E7800 Pure hypercholesterolemia, unspecified: Secondary | ICD-10-CM | POA: Diagnosis not present

## 2020-10-23 NOTE — Progress Notes (Signed)
Primary Physician/Referring:  Deland Pretty, MD  Patient ID: Sara Frye, female    DOB: [birth year], 87 y.o.   MRN: [id number]  Chief Complaint  Patient presents with  . Paroxysmal atrial fibrillation   . Follow-up    6 months  . Hypertension    HPI: Sara Frye  is a 87 y.o. female  with hypertension, hyperlipidemia,  paroxysmal Afib, bilateral MCA and ACA multifocal punctate infarcts, after which she was started on anticaogulation on 12/18/15. She was also found to have cerebral aneurysm for which she underwent endovascular obliteration of anterior communicating artery aneurysm with stent assisted coiling by Dr. Estanislado Pandy on 01/07/17.   Patient presents for 28-month follow-up of hypertension, hyperlipidemia, paroxysmal atrial fibrillation.  She continues to follow closely with Dr.Deveshwar for management of cerebral aneurysm with stenting.  Patient continues to suffer from chronic back pain as well as urinary incontinence and frequent UTI.  She reports she is doing well overall from a cardiovascular standpoint.  Reports home blood pressure readings which are well controlled.  Denies chest pain, palpitations, dyspnea, leg edema, PND, orthopnea.  Patient does report episodes of dizziness when lying flat for which she takes meclizine and has been evaluated by ENT, diagnosed with BPPV and undergone vestibular rehab which has improved symptoms significantly.  Past Medical History:  Diagnosis Date  . Arthritis    spine  . Atrial fibrillation ([place])   . Brain aneurysm 01/07/2017  . Dysrhythmia    afib  . H/O cerebral aneurysm repair ACA S/P Coiling by Dr. Estanislado Pandy 01/07/17 01/07/2017  . Headache    with Eliquis  . Hypertension   . Hypothyroidism   . Maternal blood transfusion   . Neuromuscular disorder ([place])    spinal injury- "I'm unsure of walking, I'm very careful"  . Shingles   . Stroke [facility]) 11/2015    Past Surgical History:  Procedure Laterality Date  . ABDOMINAL  HYSTERECTOMY    . APPENDECTOMY    . CARPAL TUNNEL RELEASE Bilateral   . CHOLECYSTECTOMY    . IR ANGIO INTRA EXTRACRAN SEL COM CAROTID INNOMINATE BILAT MOD SED  12/30/2016  . IR ANGIO INTRA EXTRACRAN SEL COM CAROTID INNOMINATE BILAT MOD SED  03/08/2018  . IR ANGIO INTRA EXTRACRAN SEL INTERNAL CAROTID UNI R MOD SED  01/07/2017  . IR ANGIO VERTEBRAL SEL SUBCLAVIAN INNOMINATE UNI R MOD SED  12/30/2016  . IR ANGIO VERTEBRAL SEL VERTEBRAL BILAT MOD SED  03/08/2018  . IR ANGIO VERTEBRAL SEL VERTEBRAL UNI L MOD SED  12/30/2016  . IR ANGIOGRAM FOLLOW UP STUDY  01/07/2017  . IR ANGIOGRAM FOLLOW UP STUDY  01/07/2017  . IR ANGIOGRAM FOLLOW UP STUDY  01/07/2017  . IR NEURO EACH ADD'L AFTER BASIC UNI RIGHT (MS)  01/07/2017  . IR RADIOLOGIST EVAL & MGMT  11/24/2016  . IR RADIOLOGIST EVAL & MGMT  01/25/2017  . IR TRANSCATH/EMBOLIZ  01/07/2017  . JOINT REPLACEMENT Bilateral    arthroscopic - knees  . RADIOLOGY WITH ANESTHESIA N/A 12/30/2016   Procedure: RADIOLOGY WITH ANESTHESIA  EMBOLIZATION;  Surgeon: Luanne Bras, MD;  Location: [place];  Service: Radiology;  Laterality: N/A;  . RADIOLOGY WITH ANESTHESIA N/A 01/07/2017   Procedure: EMBOLIZATION;  Surgeon: Luanne Bras, MD;  Location: [place];  Service: Radiology;  Laterality: N/A;   Social History   Tobacco Use  . Smoking status: Never Smoker  . Smokeless tobacco: Never Used  Substance Use Topics  . Alcohol use: No    Review  of Systems  Constitutional: Negative for malaise/fatigue and weight gain.  Cardiovascular: Positive for claudication and dyspnea on exertion (chronic, stable). Negative for chest pain, leg swelling, near-syncope, orthopnea, palpitations, paroxysmal nocturnal dyspnea and syncope.  Respiratory: Negative for shortness of breath.   Hematologic/Lymphatic: Does not bruise/bleed easily.  Musculoskeletal: Positive for back pain, joint pain and muscle cramps.  Gastrointestinal: Positive for hemorrhoids (no recent bleed). Negative for melena.   Genitourinary: Positive for bladder incontinence, frequency and urgency.  Neurological: Negative for dizziness and weakness.   Objective  Blood pressure 135/77, pulse 66, temperature (!) 97.2 F (36.2 C), temperature source Temporal, resp. rate 16, height 5' (1.524 m), weight 182 lb 6.4 oz (82.7 kg), SpO2 98 %. Body mass index is 35.62 kg/m. Vitals with BMI 10/23/2020 04/19/2020 12/14/2019  Height 5\' 0"  5\' 0"  5\' 0"   Weight 182 lbs 6 oz 182 lbs 183 lbs  BMI 35.62 93.81 82.99  Systolic 371 696 789  Diastolic 77 62 65  Pulse 66 70 67     Orthostatic VS for the past 72 hrs (Last 3 readings):  Orthostatic BP Patient Position BP Location Cuff Size Orthostatic Pulse  10/23/20 1021 180/89 Standing Left Arm Large 73  10/23/20 1020 169/90 Sitting Left Arm Large 70  10/23/20 1019 157/77 Supine Left Arm Large 64     Physical Exam Constitutional:      Appearance: She is obese.     Comments: Moderately obese  HENT:     Head: Normocephalic and atraumatic.  Neck:     Vascular: No JVD.  Cardiovascular:     Rate and Rhythm: Normal rate and regular rhythm.     Pulses:          Carotid pulses are 2+ on the right side and 2+ on the left side.      Femoral pulses are 2+ on the right side and 2+ on the left side.      Popliteal pulses are 1+ on the right side and 1+ on the left side.       Dorsalis pedis pulses are 1+ on the right side and 1+ on the left side.       Posterior tibial pulses are 0 on the right side and 0 on the left side.     Heart sounds: Murmur heard.   Harsh midsystolic murmur is present with a grade of 2/6 at the upper right sternal border. No gallop.   Pulmonary:     Effort: Pulmonary effort is normal.     Breath sounds: Normal breath sounds.  Abdominal:     General: Bowel sounds are normal.     Palpations: Abdomen is soft.     Comments: Obese  Skin:    General: Skin is warm and dry.     Capillary Refill: Capillary refill takes less than 2 seconds.  Neurological:      General: No focal deficit present.     Mental Status: She is alert and oriented to person, place, and time.    Laboratory examination:   CMP Latest Ref Rng & Units 10/09/2019 04/18/2019 03/08/2018  Glucose 65 - 99 mg/dL 85 - 100(H)  BUN 8 - 27 mg/dL 18 - 10  Creatinine 0.57 - 1.00 mg/dL 0.84 0.83 0.83  Sodium 134 - 144 mmol/L 144 - 140  Potassium 3.5 - 5.2 mmol/L 5.5(H) - 3.6  Chloride 96 - 106 mmol/L 106 - 105  CO2 20 - 29 mmol/L 25 - 26  Calcium 8.7 - 10.3 mg/dL  10.1 - 9.4  Total Protein 6.0 - 8.5 g/dL 7.2 - -  Total Bilirubin 0.0 - 1.2 mg/dL 0.3 - -  Alkaline Phos 39 - 117 IU/L 61 - -  AST 0 - 40 IU/L 17 - -  ALT 0 - 32 IU/L 15 - -   CBC Latest Ref Rng & Units 10/09/2019 03/08/2018 01/08/2017  WBC 3.4 - 10.8 x10E3/uL 5.7 6.3 8.8  Hemoglobin 11.1 - 15.9 g/dL 12.2 13.7 11.4(L)  Hematocrit 34.0 - 46.6 % 36.6 40.9 32.4(L)  Platelets 150 - 450 x10E3/uL 262 243 197   Lipid Panel     Component Value Date/Time   CHOL 136 10/09/2019 0812   TRIG 75 10/09/2019 0812   HDL 59 10/09/2019 0812   CHOLHDL 3.0 12/17/2015 0436   VLDL 24 12/17/2015 0436   LDLCALC 62 10/09/2019 0812   HEMOGLOBIN A1C Lab Results  Component Value Date   HGBA1C 6.1 (H) 12/17/2015   MPG 128 12/17/2015   TSH No results for input(s): TSH in the last 8760 hours.   External labs 07/11/2018:  Cholesterol, total 133.000 12/15/2017 HDL 61.000 12/15/2017 LDL 57.000 12/15/2017 Triglycerides 111.000 12/22/2018  A1C N/D Hemoglobin 13.700 G/ 07/11/2018, INR 1.080 03/08/2018 Platelets 280.000 X 07/11/2018  Creatinine, Serum 0.840 MG/ 07/11/2018 Potassium 3.600 03/08/2018 ALT (SGPT) 15.000 IU/ 07/11/2018 TSH 2.470 12/22/2018   Allergies and medications:   Allergies  Allergen Reactions  . Peanut-Containing Drug Products Itching  . Shellfish Allergy Itching    PR219 Meds:. Medications Discontinued During This Encounter  Medication Reason  . ALPRAZolam (XANAX) 0.25 MG tablet Error  . potassium chloride  (KLOR-CON) 10 MEQ tablet Error   Current Meds  Medication Sig  . Biotin 5000 MCG TABS Take 1 tablet by mouth daily.  . cholecalciferol (VITAMIN D) 1000 units tablet Take 1,000 Units by mouth daily.  Marland Kitchen ELIQUIS 5 MG TABS tablet TAKE 1 TABLET BY MOUTH TWICE DAILY  . furosemide (LASIX) 20 MG tablet TAKE 1 TABLET BY MOUTH DAILY AS NEEDED FOR LEG SWELLING  . gabapentin (NEURONTIN) 100 MG capsule Take 100 mg by mouth 2 (two) times daily.   . hydrochlorothiazide (HYDRODIURIL) 25 MG tablet Take 25 mg by mouth daily.  Marland Kitchen levothyroxine (SYNTHROID, LEVOTHROID) 25 MCG tablet Take 25 mcg by mouth daily.  . meclizine (ANTIVERT) 25 MG tablet Take 12.5 mg by mouth 3 (three) times daily as needed for dizziness.  . metoprolol succinate (TOPROL-XL) 25 MG 24 hr tablet Take 12.5 mg by mouth daily.   . rosuvastatin (CRESTOR) 5 MG tablet Take 0.5 tablets (2.5 mg total) by mouth at bedtime.  . traMADol (ULTRAM) 50 MG tablet TK 1 T PO Q 6 HOURS PRN  . valACYclovir (VALTREX) 1000 MG tablet TK 1 T PO TID FOR 7 DAYS    Radiology:  No results found.  04/18/2019: 1. No residual A-comm aneurysm sac filling, status post flow diverting stent placement and coiling. 2. Chronic ischemic microangiopathy and generalized volume loss. No acute abnormality.  Cardiac Studies:   Echo 12/17/2015 - at [facility]- Moderate LVH, EF-60-65 percent.  Grade 1 diastolic dysfunction with elevated LV filling pressure.  Mitral annulus calcification.  Severely dilated LA, trace MR.  Status post endovascular obliteration of anterior communicating artery aneurysm with stent assisted coiling by Dr. Estanislado Pandy on 01/07/2017.   EKG:   EKG 10/23/2020: Sinus rhythm at a rate of 63 bpm.  Left axis, left anterior fascicular block.  ST-T wave abnormality, cannot exclude lateral ischemia.  Compared to EKG 04/19/2020,  no significant change in lateral ST-T abnormalities.   EKG 04/19/2020: Normal sinus rhythm at rate of 68 bpm, left atrial enlargement, left  axis deviation, left anterior fascicular block.  LVH with repolarization abnormality, cannot exclude lateral ischemia.  Single PVC.  No significant change from 10/06/2019.  Assessment     ICD-10-CM   1. Paroxysmal atrial fibrillation ([facility])  I48.0 EKG 12-Lead  2. Primary hypertension  I10 PCV ECHOCARDIOGRAM COMPLETE  3. Systolic murmur  K59.9 PCV ECHOCARDIOGRAM COMPLETE  4. Hypercholesteremia  E78.00     No orders of the defined types were placed in this encounter.  Medications Discontinued During This Encounter  Medication Reason  . ALPRAZolam (XANAX) 0.25 MG tablet Error  . potassium chloride (KLOR-CON) 10 MEQ tablet Error   This patients CHA2DS2-VASc Score 5 (HTN, Vasc, AGE, F) and yearly risk of stroke 7.2%.    Recommendations:   Sara Frye  is a 87 y.o. female  with hypertension, hyperlipidemia,  paroxysmal Afib, bilateral MCA and ACA multifocal punctate infarcts, after which she was started on anticaogulation on 12/18/15. She was also found to have cerebral aneurysm for which she underwent endovascular obliteration of anterior communicating artery aneurysm with stent assisted coiling by Dr. Estanislado Pandy on 01/07/17. She is here for 6 month follow up today.    Patient presents for 57-month follow-up.  Blood pressure remains well controlled.  There is no recent lipid profile testing available for review.  Will reach out to patient's PCP office to obtain most recent blood work.  Patient has had no known recurrence of atrial fibrillation, and EKG today reveals she is maintaining sinus rhythm.  She continues to have chronic stable claudication, suspect combination of vascular and neurogenic claudication.  She continues to suffer from severe chronic back pain as well as urinary incontinence.  In regard to atrial fibrillation, she is tolerating anticoagulation without bleeding diathesis.  We will continue Eliquis.  On exam patient has new systolic murmur noted in the aortic area, will  obtain echocardiogram.  From a cardiovascular standpoint, patient remains stable.  Courage her to continue to follow closely with PCP and Dr.Deveshwar.   Follow-up in 6 months, sooner if needed, for hypertension, hyperlipidemia, and paroxysmal atrial fibrillation.   Alethia Berthold, PA-C 10/23/2020, 1:32 PM Office: [phone]

## 2020-10-29 DIAGNOSIS — N3946 Mixed incontinence: Secondary | ICD-10-CM | POA: Diagnosis not present

## 2020-11-06 ENCOUNTER — Other Ambulatory Visit: Payer: Self-pay

## 2020-11-06 ENCOUNTER — Ambulatory Visit: Payer: Medicare Other

## 2020-11-06 DIAGNOSIS — R011 Cardiac murmur, unspecified: Secondary | ICD-10-CM

## 2020-11-06 DIAGNOSIS — I10 Essential (primary) hypertension: Secondary | ICD-10-CM | POA: Diagnosis not present

## 2020-11-11 NOTE — Progress Notes (Signed)
Please inform patient echo showed normal pumping activity, some heart muscle thickening, and mild valve disease. Nothing to change at this time, will continue to monitor. It is important to make sure blood pressure stays well controlled.

## 2020-11-11 NOTE — Progress Notes (Signed)
Called and spoke to pt regarding echo results. Pt voiced understanding.

## 2020-11-19 DIAGNOSIS — N3946 Mixed incontinence: Secondary | ICD-10-CM | POA: Diagnosis not present

## 2020-11-19 DIAGNOSIS — R350 Frequency of micturition: Secondary | ICD-10-CM | POA: Diagnosis not present

## 2020-11-28 ENCOUNTER — Other Ambulatory Visit: Payer: Self-pay

## 2020-11-28 ENCOUNTER — Ambulatory Visit
Admission: RE | Admit: 2020-11-28 | Discharge: 2020-11-28 | Disposition: A | Discharge status: Home / Self Care | Payer: Medicare Other | Source: Ambulatory Visit | Attending: Internal Medicine | Admitting: Internal Medicine

## 2020-11-28 DIAGNOSIS — Z1231 Encounter for screening mammogram for malignant neoplasm of breast: Secondary | ICD-10-CM | POA: Diagnosis not present

## 2020-11-28 DIAGNOSIS — Z0000 Encounter for general adult medical examination without abnormal findings: Secondary | ICD-10-CM

## 2020-11-29 ENCOUNTER — Other Ambulatory Visit: Payer: Self-pay | Admitting: Cardiology

## 2020-12-16 DIAGNOSIS — N898 Other specified noninflammatory disorders of vagina: Secondary | ICD-10-CM | POA: Diagnosis not present

## 2020-12-17 DIAGNOSIS — R350 Frequency of micturition: Secondary | ICD-10-CM | POA: Diagnosis not present

## 2020-12-25 ENCOUNTER — Encounter: Payer: Self-pay | Admitting: Obstetrics and Gynecology

## 2020-12-25 ENCOUNTER — Other Ambulatory Visit: Payer: Self-pay

## 2020-12-25 ENCOUNTER — Other Ambulatory Visit (HOSPITAL_COMMUNITY)
Admission: RE | Admit: 2020-12-25 | Discharge: 2020-12-25 | Disposition: A | Discharge status: Home / Self Care | Payer: Medicare Other | Source: Ambulatory Visit | Attending: Obstetrics and Gynecology | Admitting: Obstetrics and Gynecology

## 2020-12-25 ENCOUNTER — Ambulatory Visit (INDEPENDENT_AMBULATORY_CARE_PROVIDER_SITE_OTHER): Payer: Medicare Other | Admitting: Obstetrics and Gynecology

## 2020-12-25 VITALS — BP 158/68 | HR 75

## 2020-12-25 DIAGNOSIS — N762 Acute vulvitis: Secondary | ICD-10-CM

## 2020-12-25 MED ORDER — FLUCONAZOLE 150 MG PO TABS: 150.0000 mg | ORAL_TABLET | Freq: Once | ORAL | 0 refills | Status: AC

## 2020-12-25 MED ORDER — NYSTATIN 100000 UNIT/GM EX POWD: 1.0000 "application " | Freq: Three times a day (TID) | CUTANEOUS | 2 refills | Status: DC

## 2020-12-25 NOTE — Patient Instructions (Addendum)
Stop using the boric acid suppositories, Clamoseptine ointment, and the Clotrimazole cream.   Use Diflucan 150 mg by mouth every 3 days for 3 days.   Use Nystatin powder in your personal pad or your Protective garment daily.   You may use Zinc oxide cream daily.   Use Dove soap.   Consider Tranquility incontinence items.

## 2020-12-25 NOTE — Progress Notes (Signed)
GYNECOLOGY  VISIT   HPI: 87 y.o.   Married  Caucasian  female   [phone] with No LMP recorded. Patient has had a hysterectomy.   here for vulvar irritation.  She has chronic vulvar symptoms.  Referred by her PCP.   Daughter Roena Malady is present with the patient today.   Trying not to scratch her bottom.   Does have some bleeding but not sure where it is coming from.   Denies vaginal discharge.  No odor.   Using boric acid vaginally daily.  Clotrimazole and betamethasone cream daily does not help.  Also using Calmoseptine ointment (Menthol 0.44% and zinc oxide 20.6%) once or twice a day, and this helps.   Difficulty controlling bladder and bowel function.  Sees Dr. Matilde Sprang and has done 12 weeks of PTNS treatment and this is helping.  Up at night once or twice.  She is also taking Myrbetriq 25 mg daily.  Taking Align and Metamucil.  Has hemorrhoids also.   GYNECOLOGIC HISTORY: No LMP recorded. Patient has had a hysterectomy. Contraception:  Hyst/BSO Menopausal hormone therapy: none Last mammogram: 11-28-20 mmg  3D/Neg/BiRads1 Last pap smear: years ago--all normal        OB History    Gravida  5   Para  3   Term  3   Preterm      AB  2   Living  2     SAB  2   IAB      Ectopic      Multiple      Live Births                 Patient Active Problem List   Diagnosis Date Noted  . BPPV (benign paroxysmal positional vertigo) 04/16/2017  . History of stroke 03/09/2017  . H/O cerebral aneurysm repair ACA S/P Coiling by Dr. Estanislado Pandy 01/07/17 01/07/2017  . Chronic anticoagulation 04/16/2016  . Speech disturbance 12/17/2015  . Atrial fibrillation ([place]) 12/17/2015  . Hypertension 12/17/2015  . Hyperlipidemia 12/17/2015  . Dizziness 12/17/2015  . Stroke (cerebrum) [facility])     Past Medical History:  Diagnosis Date  . Arthritis    spine  . Atrial fibrillation ([place])   . Brain aneurysm 01/07/2017  . Dysrhythmia    afib  . H/O cerebral aneurysm repair  ACA S/P Coiling by Dr. Estanislado Pandy 01/07/17 01/07/2017  . Headache    with Eliquis  . Hypertension   . Hypothyroidism   . Maternal blood transfusion   . Neuromuscular disorder ([place])    spinal injury- "I'm unsure of walking, I'm very careful"  . Shingles   . Stroke [facility]) 11/2015    Past Surgical History:  Procedure Laterality Date  . ABDOMINAL HYSTERECTOMY     ovaries removed  . APPENDECTOMY    . CARPAL TUNNEL RELEASE Bilateral   . CHOLECYSTECTOMY    . IR ANGIO INTRA EXTRACRAN SEL COM CAROTID INNOMINATE BILAT MOD SED  12/30/2016  . IR ANGIO INTRA EXTRACRAN SEL COM CAROTID INNOMINATE BILAT MOD SED  03/08/2018  . IR ANGIO INTRA EXTRACRAN SEL INTERNAL CAROTID UNI R MOD SED  01/07/2017  . IR ANGIO VERTEBRAL SEL SUBCLAVIAN INNOMINATE UNI R MOD SED  12/30/2016  . IR ANGIO VERTEBRAL SEL VERTEBRAL BILAT MOD SED  03/08/2018  . IR ANGIO VERTEBRAL SEL VERTEBRAL UNI L MOD SED  12/30/2016  . IR ANGIOGRAM FOLLOW UP STUDY  01/07/2017  . IR ANGIOGRAM FOLLOW UP STUDY  01/07/2017  . IR ANGIOGRAM FOLLOW UP STUDY  01/07/2017  . IR NEURO EACH ADD'L AFTER BASIC UNI RIGHT (MS)  01/07/2017  . IR RADIOLOGIST EVAL & MGMT  11/24/2016  . IR RADIOLOGIST EVAL & MGMT  01/25/2017  . IR TRANSCATH/EMBOLIZ  01/07/2017  . JOINT REPLACEMENT Bilateral    arthroscopic - knees  . RADIOLOGY WITH ANESTHESIA N/A 12/30/2016   Procedure: RADIOLOGY WITH ANESTHESIA  EMBOLIZATION;  Surgeon: Luanne Bras, MD;  Location: [place];  Service: Radiology;  Laterality: N/A;  . RADIOLOGY WITH ANESTHESIA N/A 01/07/2017   Procedure: EMBOLIZATION;  Surgeon: Luanne Bras, MD;  Location: [place];  Service: Radiology;  Laterality: N/A;    Current Outpatient Medications  Medication Sig Dispense Refill  . bifidobacterium infantis (ALIGN) capsule Take by mouth.    . Biotin 5000 MCG TABS Take 1 tablet by mouth daily.    . cholecalciferol (VITAMIN D) 1000 units tablet Take 1,000 Units by mouth daily.    . clotrimazole-betamethasone (LOTRISONE) cream 1  application    . clotrimazole-betamethasone (LOTRISONE) cream Apply topically 2 (two) times daily.    Marland Kitchen ELIQUIS 5 MG TABS tablet TAKE 1 TABLET BY MOUTH TWICE DAILY 180 tablet 3  . furosemide (LASIX) 20 MG tablet TAKE 1 TABLET BY MOUTH DAILY AS NEEDED FOR LEG SWELLING 90 tablet 1  . gabapentin (NEURONTIN) 100 MG capsule Take 100 mg by mouth 2 (two) times daily.     . hydrochlorothiazide (HYDRODIURIL) 25 MG tablet Take 25 mg by mouth daily.    Marland Kitchen levothyroxine (SYNTHROID, LEVOTHROID) 25 MCG tablet Take 25 mcg by mouth daily.  0  . meclizine (ANTIVERT) 25 MG tablet Take 12.5 mg by mouth 3 (three) times daily as needed for dizziness.    . metoprolol succinate (TOPROL-XL) 25 MG 24 hr tablet Take 12.5 mg by mouth daily.     . rosuvastatin (CRESTOR) 5 MG tablet Take 0.5 tablets (2.5 mg total) by mouth at bedtime. 90 tablet 1  . traMADol (ULTRAM) 50 MG tablet TK 1 T PO Q 6 HOURS PRN    . valACYclovir (VALTREX) 1000 MG tablet TK 1 T PO TID FOR 7 DAYS     No current facility-administered medications for this visit.   Facility-Administered Medications Ordered in Other Visits  Medication Dose Route Frequency Provider Last Rate Last Admin  . ceFAZolin (ANCEF) 2 g in dextrose 5 % 100 mL IVPB  2 g Intravenous Once Candiss Norse A, PA-C         ALLERGIES: Other, Peanut-containing drug products, and Shellfish allergy  Family History  Problem Relation Age of Onset  . Cancer Mother   . Breast cancer Sister   . Heart failure Brother        deceased age 18  . Cancer Brother   . Cancer Brother   . Cancer Sister     Social History   Socioeconomic History  . Marital status: Married    Spouse name: Not on file  . Number of children: 2  . Years of education: Not on file  . Highest education level: Not on file  Occupational History  . Not on file  Tobacco Use  . Smoking status: Never Smoker  . Smokeless tobacco: Never Used  Vaping Use  . Vaping Use: Never used  Substance and Sexual Activity   . Alcohol use: No  . Drug use: No  . Sexual activity: Not Currently  Other Topics Concern  . Not on file  Social History Narrative   Lives with sig other   Social Determinants of  Health   Financial Resource Strain: Not on file  Food Insecurity: Not on file  Transportation Needs: Not on file  Physical Activity: Not on file  Stress: Not on file  Social Connections: Not on file  Intimate Partner Violence: Not on file    Review of Systems  All other systems reviewed and are negative.   PHYSICAL EXAMINATION:    BP (!) 158/68 (Cuff Size: Large)   Pulse 75   SpO2 (!) 87%     General appearance: alert, cooperative and appears stated age   Lungs: clear to auscultation bilaterally Heart: regular rate and rhythm Abdomen: soft, non-tender, no masses,  no organomegaly No abnormal inguinal nodes palpated  Pelvic: External genitalia:  Denudation of the skin of the bilateral labia majora with excoriations.               Urethra:  normal appearing urethra with no masses, tenderness or lesions              Bartholins and Skenes: normal                 Vagina: normal appearing vagina with normal color and discharge, no lesions              Cervix: absent                Bimanual Exam:  Uterus:  absent              Adnexa: no mass, fullness, tenderness              Rectal exam: Yes.  .  Confirms.              Anus:  normal sphincter tone, no lesions  Chaperone was present for exam.  ASSESSMENT  Erosive vulvitis.  I suspect a strong component of yeast.  Urinary and fecal incontinence. Hx atrial fibrillation and stroke.  On Eliquis.   PLAN  Vaginitis testing.  Diflucan 1150 mg po q 72 hours x 3 doses.  Nystatin powder use topically three times a day if needed in protective pad. Stop all current medications for vulvitis: boric acid, clotrimazole/betamethasone, and zinc with menthol. Try zinc oxide with or without Vaseline.  Incontinence products discussed.  FU in 10 - 14  days.  May need vulvar biopsy if not significantly improved.

## 2020-12-26 ENCOUNTER — Other Ambulatory Visit: Payer: Self-pay | Admitting: Cardiology

## 2020-12-26 LAB — CERVICOVAGINAL ANCILLARY ONLY
Bacterial Vaginitis (gardnerella): NEGATIVE
Candida Glabrata: NEGATIVE
Candida Vaginitis: NEGATIVE
Comment: NEGATIVE
Comment: NEGATIVE
Comment: NEGATIVE
Comment: NEGATIVE
Trichomonas: NEGATIVE

## 2021-01-01 DIAGNOSIS — I10 Essential (primary) hypertension: Secondary | ICD-10-CM | POA: Diagnosis not present

## 2021-01-01 DIAGNOSIS — E039 Hypothyroidism, unspecified: Secondary | ICD-10-CM | POA: Diagnosis not present

## 2021-01-01 DIAGNOSIS — E7800 Pure hypercholesterolemia, unspecified: Secondary | ICD-10-CM | POA: Diagnosis not present

## 2021-01-07 NOTE — Progress Notes (Signed)
GYNECOLOGY  VISIT   HPI: 87 y.o.   Married  Caucasian  female   [phone] with No LMP recorded. Patient has had a hysterectomy.   here for follow up and possible vulvar biopsy.  Her daughter Lorre Nick is present for the visit today.    Did well with the course of Diflucan 150 mg po q 72 hours x 3 doses.  Started having irritation last night again.  Using Nystatin powder only.  She has zinc oxide at home and has not used this.   GYNECOLOGIC HISTORY: No LMP recorded. Patient has had a hysterectomy. Contraception: Hyst/BSO Menopausal hormone therapy: None Last mammogram:  11-28-20 3D/Neg/BiRads1 Last pap smear:  Years ago--all normal        OB History    Gravida  5   Para  3   Term  3   Preterm      AB  2   Living  2     SAB  2   IAB      Ectopic      Multiple      Live Births                 Patient Active Problem List   Diagnosis Date Noted  . BPPV (benign paroxysmal positional vertigo) 04/16/2017  . History of stroke 03/09/2017  . H/O cerebral aneurysm repair ACA S/P Coiling by Dr. Estanislado Pandy 01/07/17 01/07/2017  . Chronic anticoagulation 04/16/2016  . Speech disturbance 12/17/2015  . Atrial fibrillation ([place]) 12/17/2015  . Hypertension 12/17/2015  . Hyperlipidemia 12/17/2015  . Dizziness 12/17/2015  . Stroke (cerebrum) [facility])     Past Medical History:  Diagnosis Date  . Arthritis    spine  . Atrial fibrillation ([place])   . Brain aneurysm 01/07/2017  . Dysrhythmia    afib  . H/O cerebral aneurysm repair ACA S/P Coiling by Dr. Estanislado Pandy 01/07/17 01/07/2017  . Headache    with Eliquis  . Hypertension   . Hypothyroidism   . Maternal blood transfusion   . Neuromuscular disorder ([place])    spinal injury- "I'm unsure of walking, I'm very careful"  . Shingles   . Stroke [facility]) 11/2015    Past Surgical History:  Procedure Laterality Date  . ABDOMINAL HYSTERECTOMY     ovaries removed  . APPENDECTOMY    . CARPAL TUNNEL RELEASE Bilateral   . CHOLECYSTECTOMY     . IR ANGIO INTRA EXTRACRAN SEL COM CAROTID INNOMINATE BILAT MOD SED  12/30/2016  . IR ANGIO INTRA EXTRACRAN SEL COM CAROTID INNOMINATE BILAT MOD SED  03/08/2018  . IR ANGIO INTRA EXTRACRAN SEL INTERNAL CAROTID UNI R MOD SED  01/07/2017  . IR ANGIO VERTEBRAL SEL SUBCLAVIAN INNOMINATE UNI R MOD SED  12/30/2016  . IR ANGIO VERTEBRAL SEL VERTEBRAL BILAT MOD SED  03/08/2018  . IR ANGIO VERTEBRAL SEL VERTEBRAL UNI L MOD SED  12/30/2016  . IR ANGIOGRAM FOLLOW UP STUDY  01/07/2017  . IR ANGIOGRAM FOLLOW UP STUDY  01/07/2017  . IR ANGIOGRAM FOLLOW UP STUDY  01/07/2017  . IR NEURO EACH ADD'L AFTER BASIC UNI RIGHT (MS)  01/07/2017  . IR RADIOLOGIST EVAL & MGMT  11/24/2016  . IR RADIOLOGIST EVAL & MGMT  01/25/2017  . IR TRANSCATH/EMBOLIZ  01/07/2017  . JOINT REPLACEMENT Bilateral    arthroscopic - knees  . RADIOLOGY WITH ANESTHESIA N/A 12/30/2016   Procedure: RADIOLOGY WITH ANESTHESIA  EMBOLIZATION;  Surgeon: Luanne Bras, MD;  Location: [place];  Service: Radiology;  Laterality: N/A;  .  RADIOLOGY WITH ANESTHESIA N/A 01/07/2017   Procedure: EMBOLIZATION;  Surgeon: Luanne Bras, MD;  Location: [place];  Service: Radiology;  Laterality: N/A;    Current Outpatient Medications  Medication Sig Dispense Refill  . bifidobacterium infantis (ALIGN) capsule Take by mouth.    . Biotin 5000 MCG TABS Take 1 tablet by mouth daily.    . cholecalciferol (VITAMIN D) 1000 units tablet Take 1,000 Units by mouth daily.    Marland Kitchen ELIQUIS 5 MG TABS tablet TAKE 1 TABLET BY MOUTH TWICE DAILY 180 tablet 3  . furosemide (LASIX) 20 MG tablet TAKE 1 TABLET BY MOUTH DAILY AS NEEDED FOR LEG SWELLING 90 tablet 1  . gabapentin (NEURONTIN) 100 MG capsule Take 100 mg by mouth 2 (two) times daily.     . hydrochlorothiazide (HYDRODIURIL) 25 MG tablet Take 25 mg by mouth daily.    Marland Kitchen levothyroxine (SYNTHROID, LEVOTHROID) 25 MCG tablet Take 25 mcg by mouth daily.  0  . meclizine (ANTIVERT) 25 MG tablet Take 12.5 mg by mouth 3 (three) times daily as  needed for dizziness.    . metoprolol succinate (TOPROL-XL) 25 MG 24 hr tablet Take 12.5 mg by mouth daily.     Marland Kitchen nystatin (MYCOSTATIN/NYSTOP) powder Apply 1 application topically 3 (three) times daily. Apply to undergarment or personal pad daily. 15 g 2  . rosuvastatin (CRESTOR) 5 MG tablet Take 0.5 tablets (2.5 mg total) by mouth at bedtime. 90 tablet 1  . traMADol (ULTRAM) 50 MG tablet TK 1 T PO Q 6 HOURS PRN    . valACYclovir (VALTREX) 1000 MG tablet TK 1 T PO TID FOR 7 DAYS     No current facility-administered medications for this visit.   Facility-Administered Medications Ordered in Other Visits  Medication Dose Route Frequency Provider Last Rate Last Admin  . ceFAZolin (ANCEF) 2 g in dextrose 5 % 100 mL IVPB  2 g Intravenous Once Candiss Norse A, PA-C         ALLERGIES: Other, Peanut-containing drug products, and Shellfish allergy  Family History  Problem Relation Age of Onset  . Cancer Mother   . Breast cancer Sister   . Heart failure Brother        deceased age 69  . Cancer Brother   . Cancer Brother   . Cancer Sister     Social History   Socioeconomic History  . Marital status: Married    Spouse name: Not on file  . Number of children: 2  . Years of education: Not on file  . Highest education level: Not on file  Occupational History  . Not on file  Tobacco Use  . Smoking status: Never Smoker  . Smokeless tobacco: Never Used  Vaping Use  . Vaping Use: Never used  Substance and Sexual Activity  . Alcohol use: No  . Drug use: No  . Sexual activity: Not Currently  Other Topics Concern  . Not on file  Social History Narrative   Lives with sig other   Social Determinants of Health   Financial Resource Strain: Not on file  Food Insecurity: Not on file  Transportation Needs: Not on file  Physical Activity: Not on file  Stress: Not on file  Social Connections: Not on file  Intimate Partner Violence: Not on file    Review of Systems  All other  systems reviewed and are negative.   PHYSICAL EXAMINATION:    BP 128/70   Pulse 60     General appearance: alert, cooperative  and appears stated age   Pelvic: External genitalia:  Vulva with erosive beefy red change to the medial labia majora.               Urethra:  normal appearing urethra with no masses, tenderness or lesions  Vulvar biopsy Consent for procedure.  Sterile prep with Hibiclens.  Local 1% lidocaine, lot [id number], exp 05/2022.     4 mm punch biopsy of medial right labia majora.  Tissue to pathology.  2 simple sutures of 3/0 Vicryl.  No complications.  Minimal EBL.       Chaperone was present for exam.  ASSESSMENT  Chronic erosive vulvitis. Lichen planus? Status post course of Diflucan.   PLAN  Fu biopsy result.  Final plan to follow. Ok to continue Nystatin powder and use zinc oxide.  May also need clobetasol.

## 2021-01-08 DIAGNOSIS — F320 Major depressive disorder, single episode, mild: Secondary | ICD-10-CM | POA: Diagnosis not present

## 2021-01-08 DIAGNOSIS — D6869 Other thrombophilia: Secondary | ICD-10-CM | POA: Diagnosis not present

## 2021-01-08 DIAGNOSIS — R159 Full incontinence of feces: Secondary | ICD-10-CM | POA: Diagnosis not present

## 2021-01-08 DIAGNOSIS — Z780 Asymptomatic menopausal state: Secondary | ICD-10-CM | POA: Diagnosis not present

## 2021-01-08 DIAGNOSIS — I480 Paroxysmal atrial fibrillation: Secondary | ICD-10-CM | POA: Diagnosis not present

## 2021-01-08 DIAGNOSIS — Z0000 Encounter for general adult medical examination without abnormal findings: Secondary | ICD-10-CM | POA: Diagnosis not present

## 2021-01-08 DIAGNOSIS — E039 Hypothyroidism, unspecified: Secondary | ICD-10-CM | POA: Diagnosis not present

## 2021-01-08 DIAGNOSIS — B0229 Other postherpetic nervous system involvement: Secondary | ICD-10-CM | POA: Diagnosis not present

## 2021-01-08 DIAGNOSIS — R32 Unspecified urinary incontinence: Secondary | ICD-10-CM | POA: Diagnosis not present

## 2021-01-08 DIAGNOSIS — R8281 Pyuria: Secondary | ICD-10-CM | POA: Diagnosis not present

## 2021-01-08 DIAGNOSIS — I10 Essential (primary) hypertension: Secondary | ICD-10-CM | POA: Diagnosis not present

## 2021-01-09 ENCOUNTER — Ambulatory Visit (INDEPENDENT_AMBULATORY_CARE_PROVIDER_SITE_OTHER): Payer: Medicare Other | Admitting: Obstetrics and Gynecology

## 2021-01-09 ENCOUNTER — Ambulatory Visit: Payer: Medicare Other

## 2021-01-09 ENCOUNTER — Other Ambulatory Visit: Payer: Self-pay

## 2021-01-09 ENCOUNTER — Encounter: Payer: Self-pay | Admitting: Obstetrics and Gynecology

## 2021-01-09 ENCOUNTER — Other Ambulatory Visit (HOSPITAL_COMMUNITY)
Admission: RE | Admit: 2021-01-09 | Discharge: 2021-01-09 | Disposition: A | Discharge status: Home / Self Care | Payer: Medicare Other | Source: Ambulatory Visit | Attending: Obstetrics and Gynecology | Admitting: Obstetrics and Gynecology

## 2021-01-09 DIAGNOSIS — N762 Acute vulvitis: Secondary | ICD-10-CM | POA: Insufficient documentation

## 2021-01-09 DIAGNOSIS — L308 Other specified dermatitis: Secondary | ICD-10-CM | POA: Diagnosis not present

## 2021-01-09 NOTE — Patient Instructions (Signed)
Vulva Biopsy, Care After This sheet gives you information about how to care for yourself after your procedure. Your health care provider may also give you more specific instructions. If you have problems or questions, contact your health care provider. What can I expect after the procedure? After the procedure, it is common to have:  Slight bleeding from the biopsy site.  Discomfort at the biopsy site. Follow these instructions at home: Biopsy site care  Follow instructions from your health care provider about how to take care of your biopsy site. Make sure you: ? Clean the area using water and mild soap twice a day or as told by your health care provider. Gently pat the area dry. ? If you were prescribed an antibiotic ointment, apply it as told by your health care provider. Do not stop using the antibiotic even if your condition improves. ? Take a warm water bath (sitz bath) as needed to help with pain and discomfort. A sitz bath is taken while you are sitting down. The water should only come up to your hips and should cover your buttocks. ? Leave stitches (sutures), skin glue, or adhesive strips in place. These skin closures may need to stay in place for 2 weeks or longer. If adhesive strip edges start to loosen and curl up, you may trim the loose edges. Do not remove adhesive strips completely unless your health care provider tells you to do that.  Check your biopsy site every day for signs of infection. Check for: ? More redness, swelling, or pain. ? More fluid or blood. ? Warmth. ? Pus or a bad smell.  Do not rub the biopsy area after urinating. Gently pat the area dry or use a bottle filled with warm water (peri-bottle) to clean the area. Gently wipe from front to back.   Lifestyle  Wear loose, cotton underwear. Do not wear tight pants.  Do not use a tampon, douche, or put anything inside your vagina for at least 1 week or until your health care provider approves.  Do not have sex  for at least 1 week or until your health care provider approves.  Do not exercise, such as running or biking, until your health care provider approves.  Do not swim or use a hot tub until your health care provider approves. You may shower or take a sitz bath. General instructions  Take over-the-counter and prescription medicines only as told by your health care provider.  Use a sanitary napkin until the bleeding stops.  Keep all follow-up visits as told by your health care provider. This is important. Contact a health care provider if:  You have more redness, swelling, or pain around your biopsy site.  You have more fluid or blood coming from your biopsy site.  Your biopsy site feels warm to the touch.  Your pain is not controlled with medicine. Get help right away if you have:  Heavy bleeding from the vulva.  Pus or a bad smell coming from your biopsy site.  A fever.  Lower abdominal pain. Summary  After the procedure, it is common to have slight bleeding and discomfort at the biopsy site.  Follow instructions from your health care provider after your biopsy. Make sure you clean the area with water and mild soap. Pat the area dry.  Take sitz baths as needed to help with pain and discomfort. Leave any sutures in place.  Check your biopsy site for signs of infection, which may include more redness, swelling, pain,  fluid, or blood, or feeling warm to the touch.  Get help right away if you have heavy bleeding, a fever, pus or a bad smell, or pain in the lower abdomen. This information is not intended to replace advice given to you by your health care provider. Make sure you discuss any questions you have with your health care provider. Document Revised: 02/10/2018 Document Reviewed: 02/10/2018 Elsevier Patient Education  2021 [employer].

## 2021-01-13 LAB — SURGICAL PATHOLOGY

## 2021-01-17 ENCOUNTER — Other Ambulatory Visit: Payer: Self-pay | Admitting: *Deleted

## 2021-01-17 MED ORDER — CLOBETASOL PROPIONATE 0.05 % EX OINT: TOPICAL_OINTMENT | CUTANEOUS | 0 refills | Status: DC

## 2021-01-23 ENCOUNTER — Other Ambulatory Visit: Payer: Self-pay | Admitting: Obstetrics and Gynecology

## 2021-01-29 DIAGNOSIS — M8589 Other specified disorders of bone density and structure, multiple sites: Secondary | ICD-10-CM | POA: Diagnosis not present

## 2021-01-30 DIAGNOSIS — N3946 Mixed incontinence: Secondary | ICD-10-CM | POA: Diagnosis not present

## 2021-01-30 DIAGNOSIS — R350 Frequency of micturition: Secondary | ICD-10-CM | POA: Diagnosis not present

## 2021-02-06 DIAGNOSIS — M810 Age-related osteoporosis without current pathological fracture: Secondary | ICD-10-CM | POA: Diagnosis not present

## 2021-02-21 ENCOUNTER — Encounter: Payer: Self-pay | Admitting: Obstetrics and Gynecology

## 2021-02-21 ENCOUNTER — Other Ambulatory Visit: Payer: Self-pay

## 2021-02-21 ENCOUNTER — Ambulatory Visit (INDEPENDENT_AMBULATORY_CARE_PROVIDER_SITE_OTHER): Payer: Medicare Other | Admitting: Obstetrics and Gynecology

## 2021-02-21 VITALS — BP 118/66

## 2021-02-21 DIAGNOSIS — N763 Subacute and chronic vulvitis: Secondary | ICD-10-CM

## 2021-02-21 MED ORDER — CLOBETASOL PROPIONATE 0.05 % EX OINT: TOPICAL_OINTMENT | CUTANEOUS | 1 refills | Status: DC

## 2021-02-21 NOTE — Progress Notes (Signed)
GYNECOLOGY  VISIT   HPI: 87 y.o.   Married  Caucasian  female   [phone] with No LMP recorded. Patient has had a hysterectomy.   here for   follow up vulvar check.  Patient's daughter Lorre Nick is present for the visit today.   Biopsy of the vulva showed eczema.    The Clobetasol worked the first night to resolve her symptoms.  Using it twice daily.  No rash.  No more irritation.   Now she is not using zinc oxide.   She still uses the Nystatin powder.   GYNECOLOGIC HISTORY: No LMP recorded. Patient has had a hysterectomy. Contraception:  none Menopausal hormone therapy:  none Last mammogram:  11-28-20 normal Last pap smear:   ?yrs        OB History     Gravida  5   Para  3   Term  3   Preterm      AB  2   Living  2      SAB  2   IAB      Ectopic      Multiple      Live Births                 Patient Active Problem List   Diagnosis Date Noted   BPPV (benign paroxysmal positional vertigo) 04/16/2017   History of stroke 03/09/2017   H/O cerebral aneurysm repair ACA S/P Coiling by Dr. Estanislado Pandy 01/07/17 01/07/2017   Chronic anticoagulation 04/16/2016   Speech disturbance 12/17/2015   Atrial fibrillation ([place]) 12/17/2015   Hypertension 12/17/2015   Hyperlipidemia 12/17/2015   Dizziness 12/17/2015   Stroke (cerebrum) [facility])     Past Medical History:  Diagnosis Date   Arthritis    spine   Atrial fibrillation ([place])    Brain aneurysm 01/07/2017   Dysrhythmia    afib   H/O cerebral aneurysm repair ACA S/P Coiling by Dr. Estanislado Pandy 01/07/17 01/07/2017   Headache    with Eliquis   Hypertension    Hypothyroidism    Maternal blood transfusion    Neuromuscular disorder ([place])    spinal injury- "I'm unsure of walking, I'm very careful"   Shingles    Stroke ([place]) 11/2015    Past Surgical History:  Procedure Laterality Date   ABDOMINAL HYSTERECTOMY     ovaries removed   APPENDECTOMY     CARPAL TUNNEL RELEASE Bilateral    CHOLECYSTECTOMY     IR ANGIO  INTRA EXTRACRAN SEL COM CAROTID INNOMINATE BILAT MOD SED  12/30/2016   IR ANGIO INTRA EXTRACRAN SEL COM CAROTID INNOMINATE BILAT MOD SED  03/08/2018   IR ANGIO INTRA EXTRACRAN SEL INTERNAL CAROTID UNI R MOD SED  01/07/2017   IR ANGIO VERTEBRAL SEL SUBCLAVIAN INNOMINATE UNI R MOD SED  12/30/2016   IR ANGIO VERTEBRAL SEL VERTEBRAL BILAT MOD SED  03/08/2018   IR ANGIO VERTEBRAL SEL VERTEBRAL UNI L MOD SED  12/30/2016   IR ANGIOGRAM FOLLOW UP STUDY  01/07/2017   IR ANGIOGRAM FOLLOW UP STUDY  01/07/2017   IR ANGIOGRAM FOLLOW UP STUDY  01/07/2017   IR NEURO EACH ADD'L AFTER BASIC UNI RIGHT (MS)  01/07/2017   IR RADIOLOGIST EVAL & MGMT  11/24/2016   IR RADIOLOGIST EVAL & MGMT  01/25/2017   IR TRANSCATH/EMBOLIZ  01/07/2017   JOINT REPLACEMENT Bilateral    arthroscopic - knees   RADIOLOGY WITH ANESTHESIA N/A 12/30/2016   Procedure: RADIOLOGY WITH ANESTHESIA  EMBOLIZATION;  Surgeon: Luanne Bras, MD;  Location: [place];  Service: Radiology;  Laterality: N/A;   RADIOLOGY WITH ANESTHESIA N/A 01/07/2017   Procedure: EMBOLIZATION;  Surgeon: Luanne Bras, MD;  Location: [place];  Service: Radiology;  Laterality: N/A;    Current Outpatient Medications  Medication Sig Dispense Refill   bifidobacterium infantis (ALIGN) capsule Take by mouth.     Biotin 5000 MCG TABS Take 1 tablet by mouth daily.     cholecalciferol (VITAMIN D) 1000 units tablet Take 1,000 Units by mouth daily.     ELIQUIS 5 MG TABS tablet TAKE 1 TABLET BY MOUTH TWICE DAILY 180 tablet 3   furosemide (LASIX) 20 MG tablet TAKE 1 TABLET BY MOUTH DAILY AS NEEDED FOR LEG SWELLING 90 tablet 1   gabapentin (NEURONTIN) 100 MG capsule Take 100 mg by mouth 2 (two) times daily.      hydrochlorothiazide (HYDRODIURIL) 25 MG tablet Take 25 mg by mouth daily.     levothyroxine (SYNTHROID, LEVOTHROID) 25 MCG tablet Take 25 mcg by mouth daily.  0   meclizine (ANTIVERT) 25 MG tablet Take 12.5 mg by mouth 3 (three) times daily as needed for dizziness.     metoprolol  succinate (TOPROL-XL) 25 MG 24 hr tablet Take 12.5 mg by mouth daily.      nystatin (MYCOSTATIN/NYSTOP) powder Apply 1 application topically 3 (three) times daily. Apply to undergarment or personal pad daily. 15 g 2   rosuvastatin (CRESTOR) 5 MG tablet Take 0.5 tablets (2.5 mg total) by mouth at bedtime. 90 tablet 1   traMADol (ULTRAM) 50 MG tablet TK 1 T PO Q 6 HOURS PRN     valACYclovir (VALTREX) 1000 MG tablet TK 1 T PO TID FOR 7 DAYS     clobetasol ointment (TEMOVATE) 0.05 % Apply to skin in a thin layer twice day for 2 weeks for a flare then then twice a week at bedtime for a maintenance dose. 30 g 1   No current facility-administered medications for this visit.   Facility-Administered Medications Ordered in Other Visits  Medication Dose Route Frequency Provider Last Rate Last Admin   ceFAZolin (ANCEF) 2 g in dextrose 5 % 100 mL IVPB  2 g Intravenous Once Candiss Norse A, PA-C         ALLERGIES: Other, Peanut-containing drug products, and Shellfish allergy  Family History  Problem Relation Age of Onset   Cancer Mother    Breast cancer Sister    Heart failure Brother        deceased age 77   Cancer Brother    Cancer Brother    Cancer Sister     Social History   Socioeconomic History   Marital status: Married    Spouse name: Not on file   Number of children: 2   Years of education: Not on file   Highest education level: Not on file  Occupational History   Not on file  Tobacco Use   Smoking status: Never   Smokeless tobacco: Never  Vaping Use   Vaping Use: Never used  Substance and Sexual Activity   Alcohol use: No   Drug use: No   Sexual activity: Not Currently  Other Topics Concern   Not on file  Social History Narrative   Lives with sig other   Social Determinants of Health   Financial Resource Strain: Not on file  Food Insecurity: Not on file  Transportation Needs: Not on file  Physical Activity: Not on file  Stress: Not on file  Social  Connections:  Not on file  Intimate Partner Violence: Not on file    Review of Systems  See HPI.   PHYSICAL EXAMINATION:    BP 118/66 (BP Location: Right Arm, Patient Position: Sitting, Cuff Size: Normal)     General appearance: alert, cooperative and appears stated age  Inguinal regions with white creamy discharge and mild erythema of the skin.   Pelvic: External genitalia: fissures of the perineum.               Urethra:  normal appearing urethra with no masses, tenderness or lesions              Bartholins and Skenes: normal                 Vagina: generalized erythema and thinning of the introitus.               Chaperone was present for exam:  Onalee Hua., CMA.  ASSESSMENT  Chronic vulvitis.   Eczema.  Candida of flexural folds.   PLAN  Ok to continue Clobetasol ointment twice weekly at hs for maintenance and use bid for 2 weeks at a time for a flare. She will start placing in the perineal area.  Refills given. Use Nystatin for the inguinal regions.  Fu in 6 months and prn.   20 min  total time was spent for this patient encounter, including preparation, face-to-face counseling with the patient, coordination of care, and documentation of the encounter.

## 2021-02-27 DIAGNOSIS — R350 Frequency of micturition: Secondary | ICD-10-CM | POA: Diagnosis not present

## 2021-03-19 DIAGNOSIS — M810 Age-related osteoporosis without current pathological fracture: Secondary | ICD-10-CM | POA: Diagnosis not present

## 2021-03-27 DIAGNOSIS — R350 Frequency of micturition: Secondary | ICD-10-CM | POA: Diagnosis not present

## 2021-04-24 DIAGNOSIS — R350 Frequency of micturition: Secondary | ICD-10-CM | POA: Diagnosis not present

## 2021-04-30 ENCOUNTER — Ambulatory Visit: Payer: Medicare Other | Admitting: Cardiology

## 2021-04-30 ENCOUNTER — Other Ambulatory Visit: Payer: Self-pay

## 2021-04-30 ENCOUNTER — Encounter: Payer: Self-pay | Admitting: Student

## 2021-04-30 ENCOUNTER — Ambulatory Visit: Payer: Medicare Other | Admitting: Student

## 2021-04-30 VITALS — BP 127/58 | HR 61 | Temp 98.0°F | Ht 60.0 in | Wt 185.0 lb

## 2021-04-30 DIAGNOSIS — I480 Paroxysmal atrial fibrillation: Secondary | ICD-10-CM

## 2021-04-30 DIAGNOSIS — I10 Essential (primary) hypertension: Secondary | ICD-10-CM | POA: Diagnosis not present

## 2021-04-30 DIAGNOSIS — E7800 Pure hypercholesterolemia, unspecified: Secondary | ICD-10-CM | POA: Diagnosis not present

## 2021-04-30 NOTE — Progress Notes (Signed)
Primary Physician/Referring:  Deland Pretty, MD  Patient ID: Sara Frye, female    DOB: [birth year], 87 y.o.   MRN: [id number]  Chief Complaint  Patient presents with   Hypertension   Atrial Fibrillation   Other    Pt having cataract surgery 05/02/21    HPI: Sara Frye  is a 87 y.o. female  with hypertension, hyperlipidemia,  paroxysmal Afib, bilateral MCA and ACA multifocal punctate infarcts, after which she was started on anticaogulation on 12/18/15. She was also found to have cerebral aneurysm for which she underwent endovascular obliteration of anterior communicating artery aneurysm with stent assisted coiling by Dr. Estanislado Pandy on 01/07/17.   Patient presents for 93-monthfollow-up of hypertension, hyperlipidemia, and paroxysmal atrial fibrillation.  Patient is accompanied by her daughter, who is a retired rEquities trader  Patient is doing well overall without specific complaints.  Medically she is in a wheelchair in the office today, however at home she ambulates without assistance.  Patient does note occasional episodes when her heart rate is as low as 50 bpm for several minutes during which she feels fatigued, patient states she feels better if she gets up and walks around.  Denies dizziness, syncope, near syncope.  Denies chest pain, palpitations, orthopnea, PND, leg swelling.   She continues to follow closely with Dr.Deveshwar for management of cerebral aneurysm with stenting.  Patient continues to suffer from chronic back pain as well as urinary incontinence and frequent UTI.   Past Medical History:  Diagnosis Date   Arthritis    spine   [place] [address]    Brain aneurysm 01/07/2017   Dysrhythmia    afib   H/O cerebral aneurysm repair ACA S/P Coiling by Dr. DEstanislado Pandy5/17/18 01/07/2017   Headache    with Eliquis   Hypertension    Hypothyroidism    Maternal blood transfusion    [place]    spinal injury- "I'm unsure of walking,  I'm very careful"   Shingles    Stroke ([place] 11/2015   Family History  Problem Relation Age of Onset   Cancer Mother    Breast cancer Sister    Heart failure Brother        deceased age 85  Cancer Brother    Cancer Brother    Cancer Sister    Past Surgical History:  Procedure Laterality Date   ABDOMINAL HYSTERECTOMY     ovaries removed   APPENDECTOMY     CARPAL TUNNEL RELEASE Bilateral    CHOLECYSTECTOMY     IR ANGIO INTRA EXTRACRAN SEL COM CAROTID INNOMINATE BILAT MOD SED  12/30/2016   IR ANGIO INTRA EXTRACRAN SEL COM CAROTID INNOMINATE BILAT MOD SED  03/08/2018   IR ANGIO INTRA EXTRACRAN SEL INTERNAL CAROTID UNI R MOD SED  01/07/2017   IR ANGIO VERTEBRAL SEL SUBCLAVIAN INNOMINATE UNI R MOD SED  12/30/2016   IR ANGIO VERTEBRAL SEL VERTEBRAL BILAT MOD SED  03/08/2018   IR ANGIO VERTEBRAL SEL VERTEBRAL UNI L MOD SED  12/30/2016   IR ANGIOGRAM FOLLOW UP STUDY  01/07/2017   IR ANGIOGRAM FOLLOW UP STUDY  01/07/2017   IR ANGIOGRAM FOLLOW UP STUDY  01/07/2017   IR NEURO EACH ADD'L AFTER BASIC UNI RIGHT (MS)  01/07/2017   IR RADIOLOGIST EVAL & MGMT  11/24/2016   IR RADIOLOGIST EVAL & MGMT  01/25/2017   IR TRANSCATH/EMBOLIZ  01/07/2017   JOINT REPLACEMENT Bilateral    arthroscopic - knees   RADIOLOGY WITH ANESTHESIA  N/A 12/30/2016   Procedure: RADIOLOGY WITH ANESTHESIA  EMBOLIZATION;  Surgeon: Luanne Bras, MD;  Location: [place];  Service: Radiology;  Laterality: N/A;   RADIOLOGY WITH ANESTHESIA N/A 01/07/2017   Procedure: EMBOLIZATION;  Surgeon: Luanne Bras, MD;  Location: [place];  Service: Radiology;  Laterality: N/A;   Social History   Tobacco Use   Smoking status: Never   Smokeless tobacco: Never  Substance Use Topics   Alcohol use: No     ROS   Review of Systems  Constitutional: Negative for malaise/fatigue.  Cardiovascular:  Positive for claudication and dyspnea on exertion (chronic, stable). Negative for chest pain, leg swelling, near-syncope, orthopnea, palpitations,  paroxysmal nocturnal dyspnea and syncope.  Musculoskeletal:  Positive for back pain, joint pain and muscle cramps.  Gastrointestinal:  Positive for hemorrhoids (no recent bleed).  Genitourinary:  Positive for bladder incontinence, frequency and urgency.  Neurological:  Negative for dizziness.   Objective  Blood pressure (!) 127/58, pulse 61, temperature 98 F (36.7 C), height 5' (1.524 m), weight 185 lb (83.9 kg), SpO2 96 %. Body mass index is 36.13 kg/m. Vitals with BMI 04/30/2021 02/21/2021 01/09/2021  Height '5\' 0"'$  - (No Data)  Weight 185 lbs - (No Data)  BMI [id number] - -  Systolic [id number]  Diastolic 58 66 70  Pulse 61 - 60   Physical Exam Constitutional:      Appearance: She is obese.     Comments: Moderately obese  HENT:     Head: Normocephalic and atraumatic.  Neck:     Vascular: No JVD.  Cardiovascular:     Rate and Rhythm: Normal rate and regular rhythm.     Pulses:          Carotid pulses are 2+ on the right side and 2+ on the left side.      Femoral pulses are 2+ on the right side and 2+ on the left side.      Popliteal pulses are 1+ on the right side and 1+ on the left side.       Dorsalis pedis pulses are 1+ on the right side and 1+ on the left side.       Posterior tibial pulses are 0 on the right side and 0 on the left side.     Heart sounds: Murmur heard.  Harsh midsystolic murmur is present with a grade of 2/6 at the upper right sternal border.    No gallop.  Pulmonary:     Effort: Pulmonary effort is normal.     Breath sounds: Normal breath sounds.  Abdominal:     Comments: Obese  Musculoskeletal:     Right lower leg: No edema.     Left lower leg: No edema.  Skin:    General: Skin is warm and dry.  Neurological:     Mental Status: She is alert.   Laboratory examination:   CMP Latest Ref Rng & Units 10/09/2019 04/18/2019 03/08/2018  Glucose 65 - 99 mg/dL 85 - 100(H)  BUN 8 - 27 mg/dL 18 - 10  Creatinine 0.57 - 1.00 mg/dL 0.84 0.83 0.83  Sodium 134 - 144  mmol/L 144 - 140  Potassium 3.5 - 5.2 mmol/L 5.5(H) - 3.6  Chloride 96 - 106 mmol/L 106 - 105  CO2 20 - 29 mmol/L 25 - 26  Calcium 8.7 - 10.3 mg/dL 10.1 - 9.4  Total Protein 6.0 - 8.5 g/dL 7.2 - -  Total Bilirubin 0.0 - 1.2 mg/dL 0.3 - -  Alkaline Phos 39 - 117 IU/L 61 - -  AST 0 - 40 IU/L 17 - -  ALT 0 - 32 IU/L 15 - -   CBC Latest Ref Rng & Units 10/09/2019 03/08/2018 01/08/2017  WBC 3.4 - 10.8 x10E3/uL 5.7 6.3 8.8  Hemoglobin 11.1 - 15.9 g/dL 12.2 13.7 11.4(L)  Hematocrit 34.0 - 46.6 % 36.6 40.9 32.4(L)  Platelets 150 - 450 x10E3/uL 262 243 197   Lipid Panel     Component Value Date/Time   CHOL 136 10/09/2019 0812   TRIG 75 10/09/2019 0812   HDL 59 10/09/2019 0812   CHOLHDL 3.0 12/17/2015 0436   VLDL 24 12/17/2015 0436   LDLCALC 62 10/09/2019 0812   HEMOGLOBIN A1C Lab Results  Component Value Date   HGBA1C 6.1 (H) 12/17/2015   MPG 128 12/17/2015   TSH No results for input(s): TSH in the last 8760 hours.   External labs : 12/27/2019:  HDL 57, LDL 60, total cholesterol 136, triglycerides 114 BUN 18, Cr 0.84 TSH 3.62  07/11/2018 Cholesterol, total 133.000 12/15/2017 HDL 61.000 12/15/2017 LDL 57.000 12/15/2017 Triglycerides 111.000 12/22/2018  A1C N/D Hemoglobin 13.700 G/ 07/11/2018, INR 1.080 03/08/2018 Platelets 280.000 X 07/11/2018  Creatinine, Serum 0.840 MG/ 07/11/2018 Potassium 3.600 03/08/2018 ALT (SGPT) 15.000 IU/ 07/11/2018 TSH 2.470 12/22/2018  Allergies   Allergies  Allergen Reactions   Peanut-Containing Drug Products Itching   Shellfish Allergy Itching    Medications Prior to Visit:   Outpatient Medications Prior to Visit  Medication Sig Dispense Refill   Biotin 5000 MCG TABS Take 1 tablet by mouth daily.     furosemide (LASIX) 20 MG tablet TAKE 1 TABLET BY MOUTH DAILY AS NEEDED FOR LEG SWELLING 90 tablet 1   gabapentin (NEURONTIN) 100 MG capsule Take 100 mg by mouth 2 (two) times daily.      GEMTESA 75 MG TABS Take 1 tablet by mouth daily.      hydrochlorothiazide (HYDRODIURIL) 25 MG tablet Take 25 mg by mouth daily.     levothyroxine (SYNTHROID, LEVOTHROID) 25 MCG tablet Take 25 mcg by mouth daily.  0   meclizine (ANTIVERT) 25 MG tablet Take 12.5 mg by mouth 3 (three) times daily as needed for dizziness.     metoprolol succinate (TOPROL-XL) 25 MG 24 hr tablet Take 12.5 mg by mouth daily.      nystatin (MYCOSTATIN/NYSTOP) powder Apply 1 application topically 3 (three) times daily. Apply to undergarment or personal pad daily. 15 g 2   rosuvastatin (CRESTOR) 5 MG tablet Take 0.5 tablets (2.5 mg total) by mouth at bedtime. 90 tablet 1   sertraline (ZOLOFT) 50 MG tablet Take 50 mg by mouth daily.     traMADol (ULTRAM) 50 MG tablet TK 1 T PO Q 6 HOURS PRN     valACYclovir (VALTREX) 1000 MG tablet TK 1 T PO TID FOR 7 DAYS     bifidobacterium infantis (ALIGN) capsule Take by mouth.     cholecalciferol (VITAMIN D) 1000 units tablet Take 1,000 Units by mouth daily.     clobetasol ointment (TEMOVATE) 0.05 % Apply to skin in a thin layer twice day for 2 weeks for a flare then then twice a week at bedtime for a maintenance dose. 30 g 1   ELIQUIS 5 MG TABS tablet TAKE 1 TABLET BY MOUTH TWICE DAILY 180 tablet 3   Facility-Administered Medications Prior to Visit  Medication Dose Route Frequency Provider Last Rate Last Admin   ceFAZolin (ANCEF) 2 g in dextrose 5 %  100 mL IVPB  2 g Intravenous Once Candiss Norse A, PA-C       Final Medications at End of Visit    Current Meds  Medication Sig   Biotin 5000 MCG TABS Take 1 tablet by mouth daily.   furosemide (LASIX) 20 MG tablet TAKE 1 TABLET BY MOUTH DAILY AS NEEDED FOR LEG SWELLING   gabapentin (NEURONTIN) 100 MG capsule Take 100 mg by mouth 2 (two) times daily.    GEMTESA 75 MG TABS Take 1 tablet by mouth daily.   hydrochlorothiazide (HYDRODIURIL) 25 MG tablet Take 25 mg by mouth daily.   levothyroxine (SYNTHROID, LEVOTHROID) 25 MCG tablet Take 25 mcg by mouth daily.   meclizine (ANTIVERT)  25 MG tablet Take 12.5 mg by mouth 3 (three) times daily as needed for dizziness.   metoprolol succinate (TOPROL-XL) 25 MG 24 hr tablet Take 12.5 mg by mouth daily.    nystatin (MYCOSTATIN/NYSTOP) powder Apply 1 application topically 3 (three) times daily. Apply to undergarment or personal pad daily.   rosuvastatin (CRESTOR) 5 MG tablet Take 0.5 tablets (2.5 mg total) by mouth at bedtime.   sertraline (ZOLOFT) 50 MG tablet Take 50 mg by mouth daily.   traMADol (ULTRAM) 50 MG tablet TK 1 T PO Q 6 HOURS PRN   valACYclovir (VALTREX) 1000 MG tablet TK 1 T PO TID FOR 7 DAYS   Radiology:  No results found.  04/18/2019: 1. No residual A-comm aneurysm sac filling, status post flow diverting stent placement and coiling. 2. Chronic ischemic microangiopathy and generalized volume loss. No acute abnormality.  Cardiac Studies:   [facility] ECHOCARDIOGRAM COMPLETE 11/06/2020 Left ventricle cavity is normal in size. Moderate concentric hypertrophy of the left ventricle. Normal global wall motion. Normal LV systolic function with visual EF 50-55%. Unable to evaluate diastolic function due to atrial fibrillation. Left atrial cavity is mildly dilated. Trileaflet aortic valve. Mild annular calcification. No regurgitation. Mild mitral annular calcification. Mild mitral valve stenosis. Mean PG 2 mmHg, MVA 1.7 cm2 by PHT method at HR 62 bpm.  Trace regurgitation. Mild tricuspid regurgitation.  Estimated pulmonary artery systolic pressure 15 mmHg. No evidence of pulmonary hypertension.   Echo 12/17/2015 - at [facility]- Moderate LVH, EF-60-65 percent.  Grade 1 diastolic dysfunction with elevated LV filling pressure.  Mitral annulus calcification.  Severely dilated LA, trace MR.  Status post endovascular obliteration of anterior communicating artery aneurysm with stent assisted coiling by Dr. Estanislado Pandy on 01/07/2017.  EKG:  04/30/2021: Normal sinus rhythm at a rate of 63 bpm.  Left axis, left anterior fascicular block.   LVH with secondary repolarization abnormality, cannot exclude lateral ischemia.  10/23/2020: Sinus rhythm at a rate of 63 bpm.  Left axis, left anterior fascicular block.  ST-T wave abnormality, cannot exclude lateral ischemia.  Compared to EKG 04/19/2020, no significant change in lateral ST-T abnormalities.   04/19/2020: Normal sinus rhythm at rate of 68 bpm, left atrial enlargement, left axis deviation, left anterior fascicular block.  LVH with repolarization abnormality, cannot exclude lateral ischemia.  Single PVC.  No significant change from 10/06/2019.  Assessment     ICD-10-CM   1. Paroxysmal atrial fibrillation (HCC)  I48.0 EKG 12-Lead    2. Primary hypertension  I10     3. Hypercholesteremia  E78.00       No orders of the defined types were placed in this encounter.  There are no discontinued medications.  This patients CHA2DS2-VASc Score 5 (HTN, Vasc, AGE, F) and yearly risk of stroke  7.2%.    Recommendations:   Malie P Schollmeyer  is a 87 y.o. female  with hypertension, hyperlipidemia,  paroxysmal Afib, bilateral MCA and ACA multifocal punctate infarcts, after which she was started on anticaogulation on 12/18/15. She was also found to have cerebral aneurysm for which she underwent endovascular obliteration of anterior communicating artery aneurysm with stent assisted coiling by Dr. Estanislado Pandy on 01/07/17.  Patient presents for 45-monthfollow-up of hypertension, hyperlipidemia, and paroxysmal atrial fibrillation. Reviewed and discussed with patient results of echocardiogram, details above. Patient is doing well overall.  EKG today reveals sinus rhythm.  Per patient home heart rate monitoring, she remains well rate controlled.  Patient is tolerating anticoagulation without bleeding diathesis, continue Eliquis as well as metoprolol succinate.  Will not make changes to medications at this time.  Blood pressure is well controlled.  She continues to have chronic stable claudication,  suspect combination of vascular and neurogenic claudication.  She continues to suffer from severe chronic back pain as well as urinary incontinence, but this has improved since last visit. Encourage her to continue to follow closely with PCP and Dr.Deveshwar.   Follow-up in 6 months, sooner if needed, for hypertension, hyperlipidemia, and paroxysmal atrial fibrillation.   CAlethia Berthold PA-C 04/30/2021, 11:13 AM Office: [phone]

## 2021-05-02 DIAGNOSIS — H2512 Age-related nuclear cataract, left eye: Secondary | ICD-10-CM | POA: Diagnosis not present

## 2021-05-02 DIAGNOSIS — I10 Essential (primary) hypertension: Secondary | ICD-10-CM | POA: Diagnosis not present

## 2021-05-02 DIAGNOSIS — H2513 Age-related nuclear cataract, bilateral: Secondary | ICD-10-CM | POA: Diagnosis not present

## 2021-05-06 ENCOUNTER — Encounter: Payer: Self-pay | Admitting: Student

## 2021-05-08 DIAGNOSIS — N898 Other specified noninflammatory disorders of vagina: Secondary | ICD-10-CM | POA: Diagnosis not present

## 2021-05-08 DIAGNOSIS — Z23 Encounter for immunization: Secondary | ICD-10-CM | POA: Diagnosis not present

## 2021-05-08 DIAGNOSIS — E559 Vitamin D deficiency, unspecified: Secondary | ICD-10-CM | POA: Diagnosis not present

## 2021-05-08 DIAGNOSIS — E7800 Pure hypercholesterolemia, unspecified: Secondary | ICD-10-CM | POA: Diagnosis not present

## 2021-05-08 DIAGNOSIS — R32 Unspecified urinary incontinence: Secondary | ICD-10-CM | POA: Diagnosis not present

## 2021-05-08 DIAGNOSIS — I10 Essential (primary) hypertension: Secondary | ICD-10-CM | POA: Diagnosis not present

## 2021-05-08 DIAGNOSIS — B0229 Other postherpetic nervous system involvement: Secondary | ICD-10-CM | POA: Diagnosis not present

## 2021-05-13 IMAGING — MG MM DIGITAL SCREENING BILAT W/ TOMO AND CAD
8 series · 8 of 24 positions shown · non-contrast
Comparison: Previous exam(s).

CLINICAL DATA: Screening.

EXAM:
DIGITAL SCREENING BILATERAL MAMMOGRAM WITH TOMOSYNTHESIS AND CAD
TECHNIQUE: Bilateral screening digital craniocaudal and mediolateral oblique
mammograms were obtained. Bilateral screening digital breast
tomosynthesis was performed. The images were evaluated with
computer-aided detection.

[R CC synth-2D]
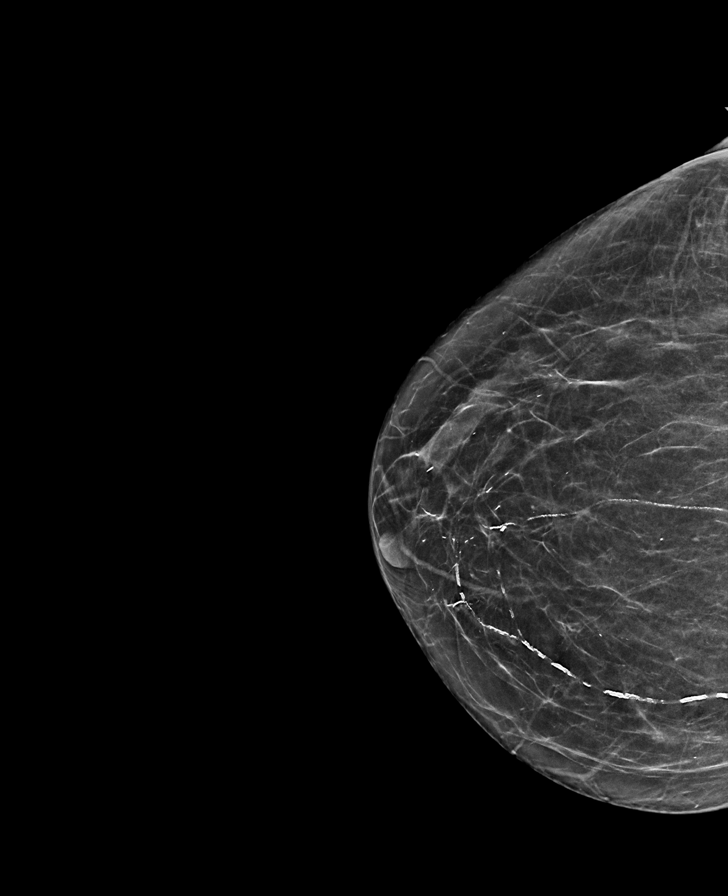

[L MLO synth-2D]
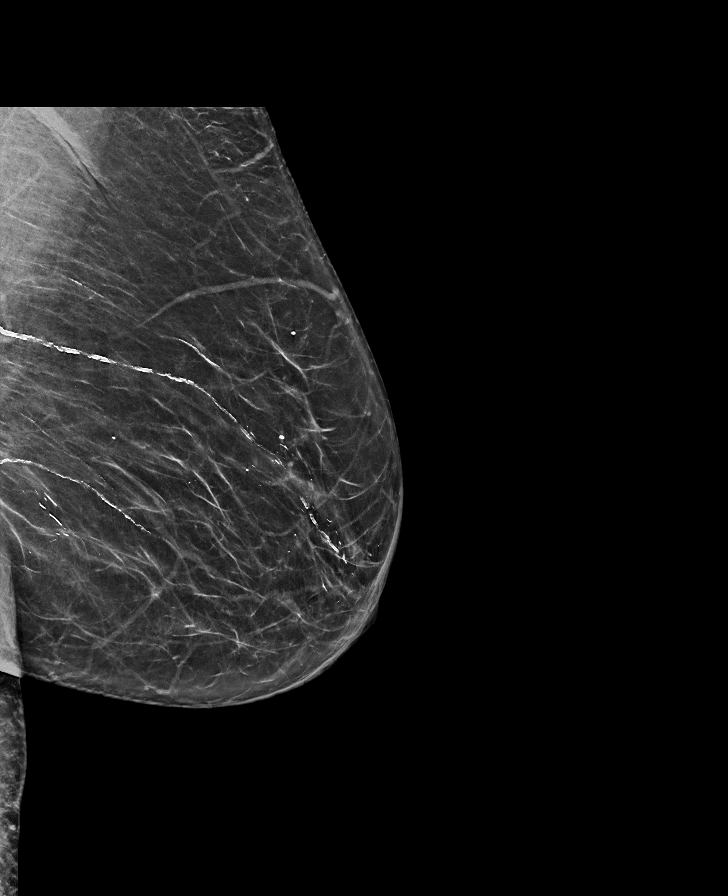

[L CC synth-2D]
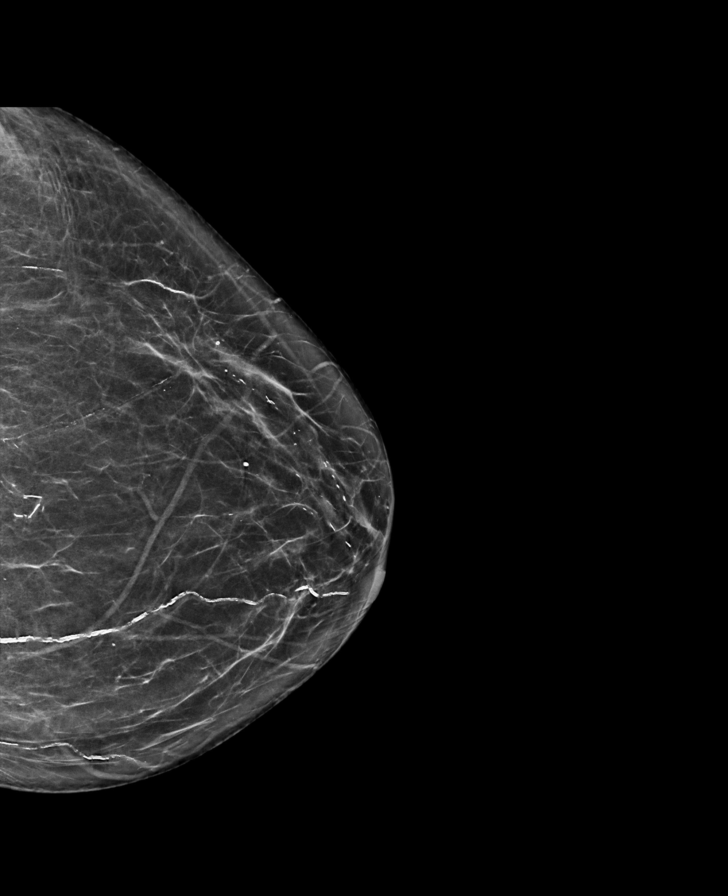

[R MLO synth-2D]
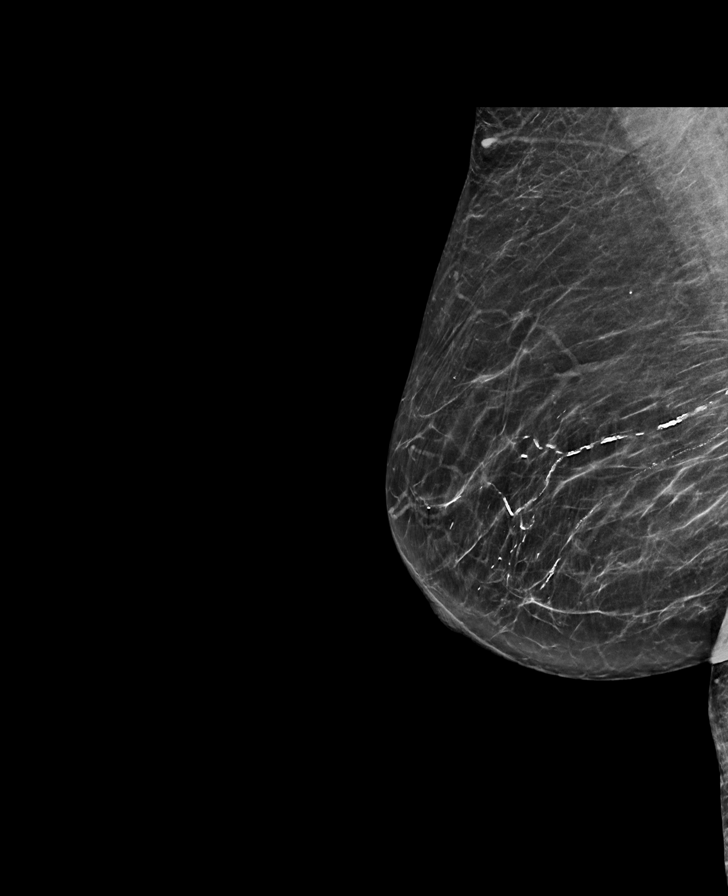

[R MLO tomo · tomo slice 32/63.0]
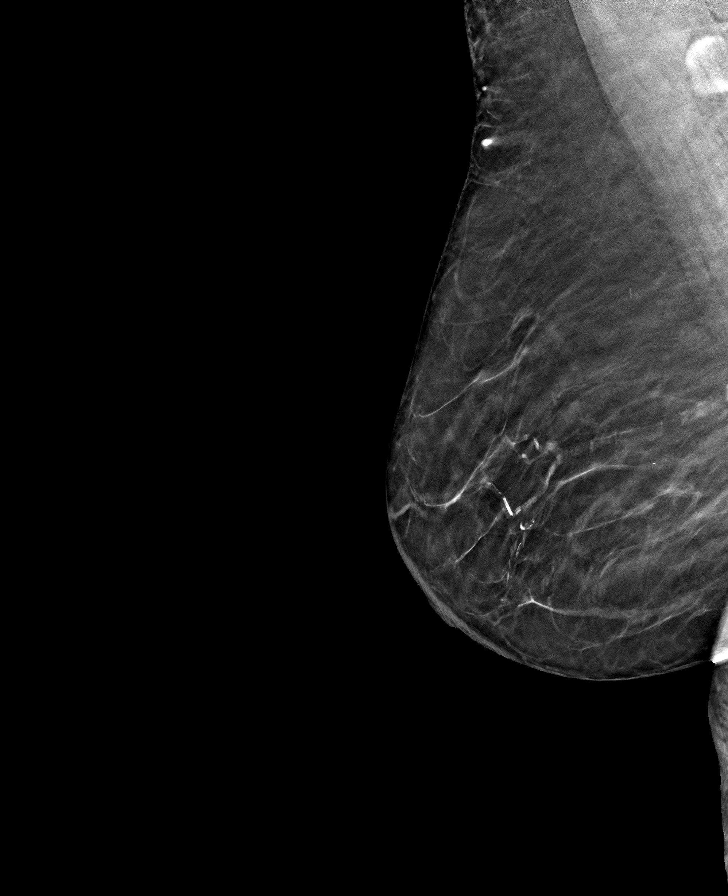

[R CC tomo · tomo slice 27/54.0]
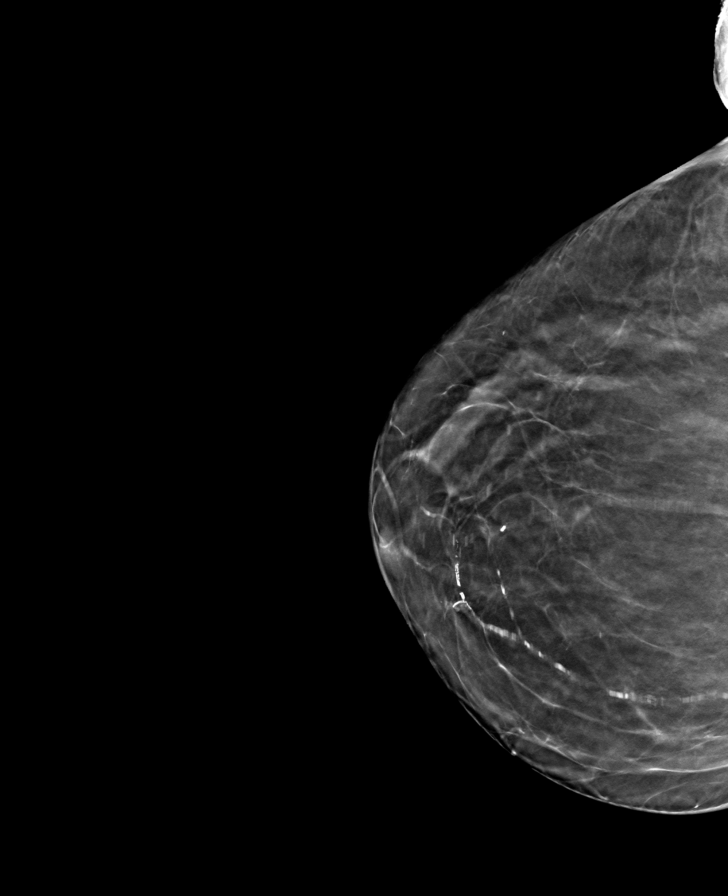

[L CC tomo · tomo slice 33/64.0]
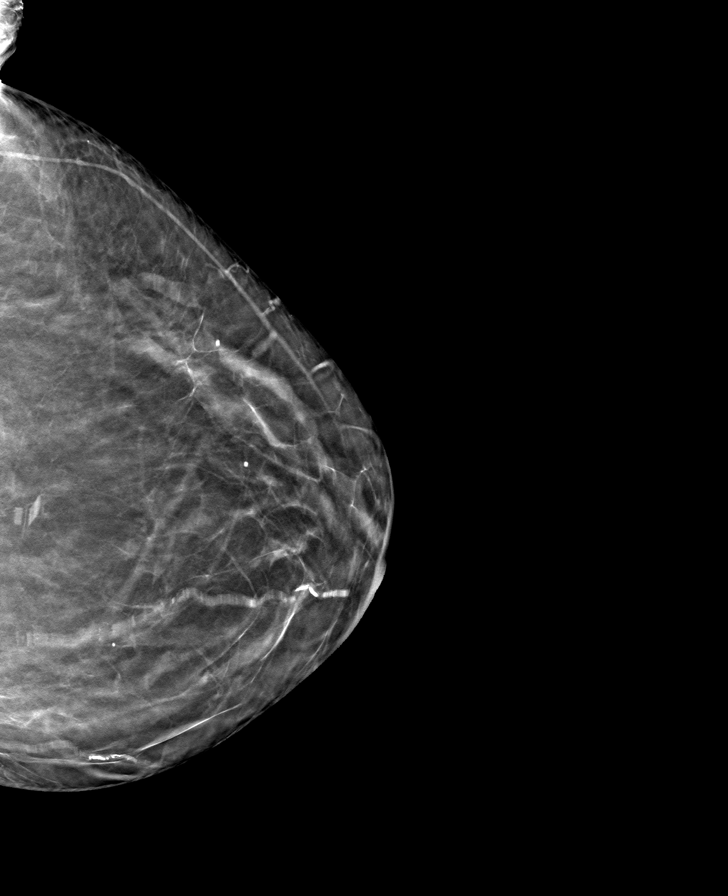

[L MLO tomo · tomo slice 33/66.0]
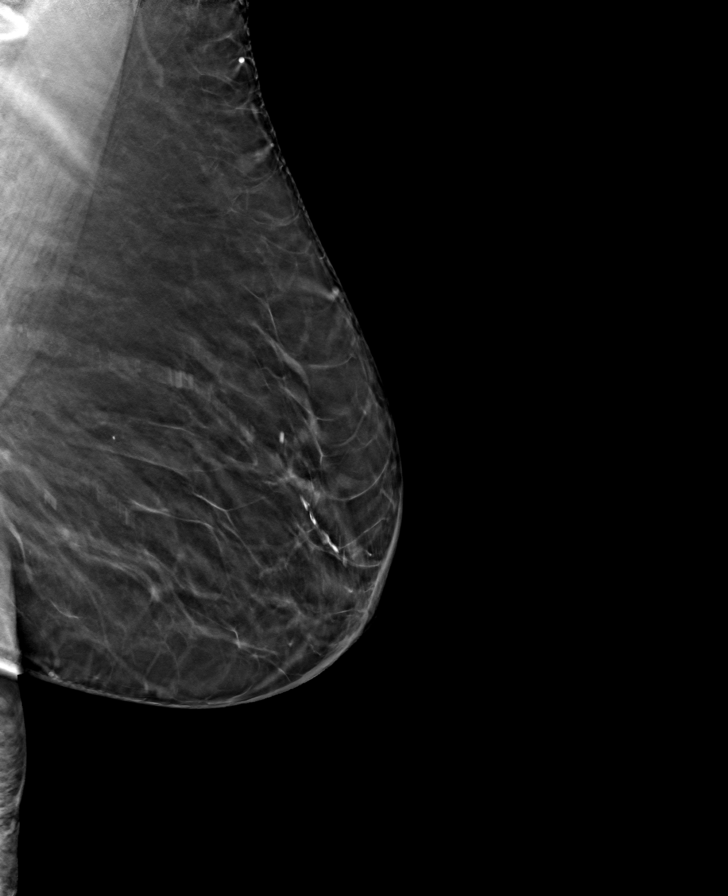

[8 of 24 positions shown; findings below may reference images not displayed]

ACR Breast Density Category b: There are scattered areas of
fibroglandular density.
FINDINGS: There are no findings suspicious for malignancy. The images were
evaluated with computer-aided detection.
IMPRESSION: No mammographic evidence of malignancy. A result letter of this
screening mammogram will be mailed directly to the patient.

RECOMMENDATION:
Screening mammogram in one year. (Code:WJ-I-BG6)

BI-RADS CATEGORY  1: Negative.

## 2021-06-03 DIAGNOSIS — R350 Frequency of micturition: Secondary | ICD-10-CM | POA: Diagnosis not present

## 2021-06-05 DIAGNOSIS — H2512 Age-related nuclear cataract, left eye: Secondary | ICD-10-CM | POA: Diagnosis not present

## 2021-06-06 DIAGNOSIS — H2511 Age-related nuclear cataract, right eye: Secondary | ICD-10-CM | POA: Diagnosis not present

## 2021-06-06 DIAGNOSIS — H25041 Posterior subcapsular polar age-related cataract, right eye: Secondary | ICD-10-CM | POA: Diagnosis not present

## 2021-06-06 DIAGNOSIS — H25011 Cortical age-related cataract, right eye: Secondary | ICD-10-CM | POA: Diagnosis not present

## 2021-06-24 ENCOUNTER — Other Ambulatory Visit: Payer: Self-pay | Admitting: Cardiology

## 2021-06-24 ENCOUNTER — Telehealth (HOSPITAL_COMMUNITY): Payer: Self-pay

## 2021-06-24 NOTE — Telephone Encounter (Signed)
Called to schedule mri, no answer, left vm. AW 

## 2021-07-03 DIAGNOSIS — R350 Frequency of micturition: Secondary | ICD-10-CM | POA: Diagnosis not present

## 2021-07-08 DIAGNOSIS — E7800 Pure hypercholesterolemia, unspecified: Secondary | ICD-10-CM | POA: Diagnosis not present

## 2021-07-08 DIAGNOSIS — E559 Vitamin D deficiency, unspecified: Secondary | ICD-10-CM | POA: Diagnosis not present

## 2021-07-11 DIAGNOSIS — E559 Vitamin D deficiency, unspecified: Secondary | ICD-10-CM | POA: Diagnosis not present

## 2021-07-11 DIAGNOSIS — I10 Essential (primary) hypertension: Secondary | ICD-10-CM | POA: Diagnosis not present

## 2021-07-11 DIAGNOSIS — I4891 Unspecified atrial fibrillation: Secondary | ICD-10-CM | POA: Diagnosis not present

## 2021-07-11 DIAGNOSIS — E7800 Pure hypercholesterolemia, unspecified: Secondary | ICD-10-CM | POA: Diagnosis not present

## 2021-07-11 DIAGNOSIS — Z7901 Long term (current) use of anticoagulants: Secondary | ICD-10-CM | POA: Diagnosis not present

## 2021-07-11 DIAGNOSIS — E039 Hypothyroidism, unspecified: Secondary | ICD-10-CM | POA: Diagnosis not present

## 2021-07-14 NOTE — Progress Notes (Signed)
External labs: 07/08/2021: ALT 21, AST 22, BUN 14, creatinine 0.8, potassium 3.6, sodium 136, GFR >60 Total cholesterol 134, HDL 57, LDL 58, triglycerides 93  01/01/2021: TSH 3.45 HCT 36.6, Hgb 12.4, MCV 84.3, platelet 270

## 2021-07-31 DIAGNOSIS — R350 Frequency of micturition: Secondary | ICD-10-CM | POA: Diagnosis not present

## 2021-08-28 ENCOUNTER — Ambulatory Visit: Payer: Medicare Other | Admitting: Obstetrics and Gynecology

## 2021-09-02 DIAGNOSIS — R350 Frequency of micturition: Secondary | ICD-10-CM | POA: Diagnosis not present

## 2021-10-21 DIAGNOSIS — R350 Frequency of micturition: Secondary | ICD-10-CM | POA: Diagnosis not present

## 2021-10-27 NOTE — Progress Notes (Signed)
Primary Physician/Referring:  Deland Pretty, MD  Patient ID: Sara Frye, female    DOB: 07/02/1929, 88 y.o.   MRN: [id number]  Chief Complaint  Patient presents with   Hypertension   HLD   PAF    6 MONTH    HPI: Sara Frye  is a 88 y.o. female  with hypertension, hyperlipidemia,  paroxysmal Afib, bilateral MCA and ACA multifocal punctate infarcts, after which she was started on anticaogulation on 12/18/15. She was also found to have cerebral aneurysm for which she underwent endovascular obliteration of anterior communicating artery aneurysm with stent assisted coiling by Dr. Estanislado Pandy on 01/07/17.   Patient presents for 71-monthfollow-up.  At last office visit patient was stable from a cardiovascular standpoint, therefore no changes were made.  Patient is feeling relatively well overall, however she does complain of bilateral ankle swelling intermittently over the last few months.  Notably patient has only been taking hydrochlorothiazide 12.5 mg once daily instead of 25 mg and she admits to very high dietary sodium intake.  Denies chest pain, orthopnea, PND.  She continues to follow closely with Dr.Deveshwar for management of cerebral aneurysm with stenting.  Past Medical History:  Diagnosis Date   Arthritis    spine   Atrial fibrillation ([facility]    Brain aneurysm 01/07/2017   Dysrhythmia    afib   H/O cerebral aneurysm repair ACA S/P Coiling by Dr. DEstanislado Pandy5/17/18 01/07/2017   Headache    with Eliquis   Hypertension    Hypothyroidism    Maternal blood transfusion    Neuromuscular disorder ([facility]    spinal injury- "I'm unsure of walking, I'm very careful"   Shingles    [place] [place]   Family History  Problem Relation Age of Onset   Cancer Mother    Breast cancer Sister    Heart failure Brother        deceased age 618  Cancer Brother    Cancer Brother    Cancer Sister    Past Surgical History:  Procedure Laterality Date   ABDOMINAL HYSTERECTOMY      ovaries removed   APPENDECTOMY     CARPAL TUNNEL RELEASE Bilateral    CATARACT EXTRACTION     CHOLECYSTECTOMY     IR ANGIO INTRA EXTRACRAN SEL COM CAROTID INNOMINATE BILAT MOD SED  12/30/2016   IR ANGIO INTRA EXTRACRAN SEL COM CAROTID INNOMINATE BILAT MOD SED  03/08/2018   IR ANGIO INTRA EXTRACRAN SEL INTERNAL CAROTID UNI R MOD SED  01/07/2017   IR ANGIO VERTEBRAL SEL SUBCLAVIAN INNOMINATE UNI R MOD SED  12/30/2016   IR ANGIO VERTEBRAL SEL VERTEBRAL BILAT MOD SED  03/08/2018   IR ANGIO VERTEBRAL SEL VERTEBRAL UNI L MOD SED  12/30/2016   IR ANGIOGRAM FOLLOW UP STUDY  01/07/2017   IR ANGIOGRAM FOLLOW UP STUDY  01/07/2017   IR ANGIOGRAM FOLLOW UP STUDY  01/07/2017   IR NEURO EACH ADD'L AFTER BASIC UNI RIGHT (MS)  01/07/2017   IR RADIOLOGIST EVAL & MGMT  11/24/2016   IR RADIOLOGIST EVAL & MGMT  01/25/2017   IR TRANSCATH/EMBOLIZ  01/07/2017   JOINT REPLACEMENT Bilateral    arthroscopic - knees   RADIOLOGY WITH ANESTHESIA N/A 12/30/2016   Procedure: RADIOLOGY WITH ANESTHESIA  EMBOLIZATION;  Surgeon: DLuanne Bras MD;  [place] [facility]  Service: Radiology;  Laterality: N/A;   RADIOLOGY WITH ANESTHESIA N/A 01/07/2017   Procedure: EMBOLIZATION;  Surgeon: DLuanne Bras MD;  [place] [facility]  Service: Radiology;  Laterality: N/A;   Social History   Tobacco Use   Smoking status: Never   Smokeless tobacco: Never  Substance Use Topics   Alcohol use: No     ROS   Review of Systems  Constitutional: Negative for malaise/fatigue.  Cardiovascular:  Positive for claudication (stable) and dyspnea on exertion (chronic, stable). Negative for chest pain, leg swelling, near-syncope, orthopnea, palpitations, paroxysmal nocturnal dyspnea and syncope.  Musculoskeletal:  Positive for back pain, joint pain and muscle cramps.  Gastrointestinal:  Positive for hemorrhoids (no recent bleed).  Genitourinary:  Positive for bladder incontinence, frequency and urgency.  Neurological:  Negative  for dizziness.   Objective  Blood pressure 127/71, pulse 84, temperature (!) 97.5 F (36.4 C), temperature source Temporal, resp. rate 17, height 5' (1.524 m), weight 176 lb 9.6 oz (80.1 kg), SpO2 94 %. Body mass index is 34.49 kg/m. Vitals with BMI 10/28/2021 04/30/2021 02/21/2021  Height '5\' 0"'$  '5\' 0"'$  -  Weight 176 lbs 10 oz 185 lbs -  BMI 81.15 72.62 -  Systolic 035 597 416  Diastolic 71 58 66  Pulse 84 61 -   Physical Exam Vitals reviewed.  Constitutional:      Appearance: She is obese.     Comments: Moderately obese  Neck:     Vascular: No JVD.  Cardiovascular:     Rate and Rhythm: Normal rate and regular rhythm.     Pulses:          Carotid pulses are 2+ on the right side and 2+ on the left side.      Femoral pulses are 2+ on the right side and 2+ on the left side.      Popliteal pulses are 1+ on the right side and 1+ on the left side.       Dorsalis pedis pulses are 1+ on the right side and 1+ on the left side.       Posterior tibial pulses are 0 on the right side and 0 on the left side.     Heart sounds: Murmur heard.  Harsh midsystolic murmur is present with a grade of 2/6 at the upper right sternal border.    No gallop.  Pulmonary:     Effort: Pulmonary effort is normal.     Breath sounds: Normal breath sounds.  Abdominal:     Comments: Obese  Musculoskeletal:     Right lower leg: Edema (trace) present.     Left lower leg: Edema (trace) present.   Laboratory examination:   CMP Latest Ref Rng & Units 10/09/2019 04/18/2019 03/08/2018  Glucose 65 - 99 mg/dL 85 - 100(H)  BUN 8 - 27 mg/dL 18 - 10  Creatinine 0.57 - 1.00 mg/dL 0.84 0.83 0.83  Sodium 134 - 144 mmol/L 144 - 140  Potassium 3.5 - 5.2 mmol/L 5.5(H) - 3.6  Chloride 96 - 106 mmol/L 106 - 105  CO2 20 - 29 mmol/L 25 - 26  Calcium 8.7 - 10.3 mg/dL 10.1 - 9.4  Total Protein 6.0 - 8.5 g/dL 7.2 - -  Total Bilirubin 0.0 - 1.2 mg/dL 0.3 - -  Alkaline Phos 39 - 117 IU/L 61 - -  AST 0 - 40 IU/L 17 - -  ALT 0 - 32 IU/L  15 - -   CBC Latest Ref Rng & Units 10/09/2019 03/08/2018 01/08/2017  WBC 3.4 - 10.8 x10E3/uL 5.7 6.3 8.8  Hemoglobin 11.1 - 15.9 g/dL 12.2 13.7 11.4(L)  Hematocrit 34.0 - 46.6 %  36.6 40.9 32.4(L)  Platelets 150 - 450 x10E3/uL 262 243 197   Lipid Panel     Component Value Date/Time   CHOL 136 10/09/2019 0812   TRIG 75 10/09/2019 0812   HDL 59 10/09/2019 0812   CHOLHDL 3.0 12/17/2015 0436   VLDL 24 12/17/2015 0436   LDLCALC 62 10/09/2019 0812   HEMOGLOBIN A1C Lab Results  Component Value Date   HGBA1C 6.1 (H) 12/17/2015   MPG 128 12/17/2015   TSH No results for input(s): TSH in the last 8760 hours.   External labs : 01/01/2021: Total cholesterol 144, HDL 56, LDL 60, triglycerides 93  12/27/2019:  HDL 57, LDL 60, total cholesterol 136, triglycerides 114 BUN 18, Cr 0.84 TSH 3.62  07/11/2018 Cholesterol, total 133.000 12/15/2017 HDL 61.000 12/15/2017 LDL 57.000 12/15/2017 Triglycerides 111.000 12/22/2018  A1C N/D Hemoglobin 13.700 G/ 07/11/2018, INR 1.080 03/08/2018 Platelets 280.000 X 07/11/2018  Creatinine, Serum 0.840 MG/ 07/11/2018 Potassium 3.600 03/08/2018 ALT (SGPT) 15.000 IU/ 07/11/2018 TSH 2.470 12/22/2018  Allergies   Allergies  Allergen Reactions   Peanut-Containing Drug Products Itching   Shellfish Allergy Itching    Medications Prior to Visit:   Outpatient Medications Prior to Visit  Medication Sig Dispense Refill   bifidobacterium infantis (ALIGN) capsule Take by mouth.     Biotin 5000 MCG TABS Take 1 tablet by mouth daily.     cholecalciferol (VITAMIN D) 1000 units tablet Take 1,000 Units by mouth daily.     clobetasol ointment (TEMOVATE) 0.05 % Apply to skin in a thin layer twice day for 2 weeks for a flare then then twice a week at bedtime for a maintenance dose. 30 g 1   ELIQUIS 5 MG TABS tablet TAKE 1 TABLET BY MOUTH TWICE DAILY 180 tablet 3   furosemide (LASIX) 20 MG tablet TAKE 1 TABLET BY MOUTH DAILY AS NEEDED FOR LEG SWELLING 90 tablet 1    gabapentin (NEURONTIN) 100 MG capsule Take 100 mg by mouth 2 (two) times daily.      GEMTESA 75 MG TABS Take 1 tablet by mouth daily.     levothyroxine (SYNTHROID, LEVOTHROID) 25 MCG tablet Take 25 mcg by mouth daily.  0   meclizine (ANTIVERT) 25 MG tablet Take 12.5 mg by mouth 3 (three) times daily as needed for dizziness.     metoprolol succinate (TOPROL-XL) 25 MG 24 hr tablet Take 12.5 mg by mouth daily.      nystatin (MYCOSTATIN/NYSTOP) powder Apply 1 application topically 3 (three) times daily. Apply to undergarment or personal pad daily. 15 g 2   rosuvastatin (CRESTOR) 5 MG tablet Take 0.5 tablets (2.5 mg total) by mouth at bedtime. 90 tablet 1   sertraline (ZOLOFT) 50 MG tablet Take 50 mg by mouth daily.     traMADol (ULTRAM) 50 MG tablet TK 1 T PO Q 6 HOURS PRN     valACYclovir (VALTREX) 1000 MG tablet TK 1 T PO TID FOR 7 DAYS     hydrochlorothiazide (HYDRODIURIL) 25 MG tablet Take 25 mg by mouth daily.     Facility-Administered Medications Prior to Visit  Medication Dose Route Frequency Provider Last Rate Last Admin   ceFAZolin (ANCEF) 2 g in dextrose 5 % 100 mL IVPB  2 g Intravenous Once Candiss Norse A, PA-C       Final Medications at End of Visit    Current Meds  Medication Sig   bifidobacterium infantis (ALIGN) capsule Take by mouth.   Biotin 5000 MCG TABS Take 1 tablet  by mouth daily.   cholecalciferol (VITAMIN D) 1000 units tablet Take 1,000 Units by mouth daily.   clobetasol ointment (TEMOVATE) 0.05 % Apply to skin in a thin layer twice day for 2 weeks for a flare then then twice a week at bedtime for a maintenance dose.   ELIQUIS 5 MG TABS tablet TAKE 1 TABLET BY MOUTH TWICE DAILY   furosemide (LASIX) 20 MG tablet TAKE 1 TABLET BY MOUTH DAILY AS NEEDED FOR LEG SWELLING   gabapentin (NEURONTIN) 100 MG capsule Take 100 mg by mouth 2 (two) times daily.    GEMTESA 75 MG TABS Take 1 tablet by mouth daily.   levothyroxine (SYNTHROID, LEVOTHROID) 25 MCG tablet Take 25 mcg  by mouth daily.   meclizine (ANTIVERT) 25 MG tablet Take 12.5 mg by mouth 3 (three) times daily as needed for dizziness.   metoprolol succinate (TOPROL-XL) 25 MG 24 hr tablet Take 12.5 mg by mouth daily.    nystatin (MYCOSTATIN/NYSTOP) powder Apply 1 application topically 3 (three) times daily. Apply to undergarment or personal pad daily.   rosuvastatin (CRESTOR) 5 MG tablet Take 0.5 tablets (2.5 mg total) by mouth at bedtime.   sertraline (ZOLOFT) 50 MG tablet Take 50 mg by mouth daily.   traMADol (ULTRAM) 50 MG tablet TK 1 T PO Q 6 HOURS PRN   valACYclovir (VALTREX) 1000 MG tablet TK 1 T PO TID FOR 7 DAYS   [DISCONTINUED] hydrochlorothiazide (HYDRODIURIL) 25 MG tablet Take 25 mg by mouth daily.   Radiology:  No results found.  04/18/2019: 1. No residual A-comm aneurysm sac filling, status post flow diverting stent placement and coiling. 2. Chronic ischemic microangiopathy and generalized volume loss. No acute abnormality.  Cardiac Studies:   [facility] ECHOCARDIOGRAM COMPLETE 11/06/2020 Left ventricle cavity is normal in size. Moderate concentric hypertrophy of the left ventricle. Normal global wall motion. Normal LV systolic function with visual EF 50-55%. Unable to evaluate diastolic function due to atrial fibrillation. Left atrial cavity is mildly dilated. Trileaflet aortic valve. Mild annular calcification. No regurgitation. Mild mitral annular calcification. Mild mitral valve stenosis. Mean PG 2 mmHg, MVA 1.7 cm2 by PHT method at HR 62 bpm.  Trace regurgitation. Mild tricuspid regurgitation.  Estimated pulmonary artery systolic pressure 15 mmHg. No evidence of pulmonary hypertension.   Echo 12/17/2015 - at [facility]- Moderate LVH, EF-60-65 percent.  Grade 1 diastolic dysfunction with elevated LV filling pressure.  Mitral annulus calcification.  Severely dilated LA, trace MR.  Status post endovascular obliteration of anterior communicating artery aneurysm with stent assisted coiling by  Dr. Estanislado Pandy on 01/07/2017.  EKG:  10/28/2021: Sinus rhythm with PACs at a rate of 73 bpm.  Left axis, left anterior fascicular block.  LVH with secondary repolarization abnormalities, cannot exclude lateral ischemia.  Compared EKG 04/30/2021, no significant change.  10/23/2020: Sinus rhythm at a rate of 63 bpm.  Left axis, left anterior fascicular block.  ST-T wave abnormality, cannot exclude lateral ischemia.  Compared to EKG 04/19/2020, no significant change in lateral ST-T abnormalities.   04/19/2020: Normal sinus rhythm at rate of 68 bpm, left atrial enlargement, left axis deviation, left anterior fascicular block.  LVH with repolarization abnormality, cannot exclude lateral ischemia.  Single PVC.  No significant change from 10/06/2019.  Assessment     ICD-10-CM   1. Paroxysmal atrial fibrillation (HCC)  I48.0 EKG 12-Lead    2. Primary hypertension  I10       Meds ordered this encounter  Medications   hydrochlorothiazide (HYDRODIURIL) 12.5  MG tablet    Sig: Take 1 tablet (12.5 mg total) by mouth daily.    Dispense:  90 tablet    Refill:  3    Medications Discontinued During This Encounter  Medication Reason   hydrochlorothiazide (HYDRODIURIL) 25 MG tablet Reorder    This patients CHA2DS2-VASc Score 5 (HTN, Vasc, AGE, F) and yearly risk of stroke 7.2%.  Recommendations:   Sara Frye  is a 88 y.o. female  with hypertension, hyperlipidemia,  paroxysmal Afib, bilateral MCA and ACA multifocal punctate infarcts, after which she was started on anticaogulation on 12/18/15. She was also found to have cerebral aneurysm for which she underwent endovascular obliteration of anterior communicating artery aneurysm with stent assisted coiling by Dr. Estanislado Pandy on 01/07/17.  Patient presents for 69-monthfollow-up.  At last office visit patient was stable from a cardiovascular standpoint, therefore no changes were made.  EKG today reveals sinus rhythm with PACs.  Patient has had no known  recurrence of atrial fibrillation since last office visit.  She continues to tolerate Eliquis without bleeding diathesis.  Blood pressure is well controlled.  In regard to bilateral ankle swelling suspect this is related to high sodium intake, counseled patient regarding dietary and lifestyle modifications including DASH diet.  Patient is otherwise doing well advised that she may continue Eliquis, Hydrochlorothiazide, metoprolol, rosuvastatin, and as needed Lasix.  Encourage her to continue to follow closely with PCP and Dr.Deveshwar.   Follow-up in 6 months, sooner if needed   CAlethia Berthold PA-C 10/28/2021, 2:12 PM Office: [phone]

## 2021-10-28 ENCOUNTER — Ambulatory Visit: Payer: Medicare Other | Admitting: Student

## 2021-10-28 ENCOUNTER — Other Ambulatory Visit: Payer: Self-pay

## 2021-10-28 ENCOUNTER — Encounter: Payer: Self-pay | Admitting: Student

## 2021-10-28 VITALS — BP 127/71 | HR 84 | Temp 97.5°F | Resp 17 | Ht 60.0 in | Wt 176.6 lb

## 2021-10-28 DIAGNOSIS — I480 Paroxysmal atrial fibrillation: Secondary | ICD-10-CM | POA: Diagnosis not present

## 2021-10-28 DIAGNOSIS — I10 Essential (primary) hypertension: Secondary | ICD-10-CM

## 2021-10-28 MED ORDER — HYDROCHLOROTHIAZIDE 12.5 MG PO TABS: 12.5000 mg | ORAL_TABLET | Freq: Every day | ORAL | 3 refills | Status: DC

## 2021-11-05 DIAGNOSIS — M810 Age-related osteoporosis without current pathological fracture: Secondary | ICD-10-CM | POA: Diagnosis not present

## 2021-11-18 DIAGNOSIS — R350 Frequency of micturition: Secondary | ICD-10-CM | POA: Diagnosis not present

## 2021-11-18 DIAGNOSIS — L03115 Cellulitis of right lower limb: Secondary | ICD-10-CM | POA: Diagnosis not present

## 2021-11-21 DIAGNOSIS — L03115 Cellulitis of right lower limb: Secondary | ICD-10-CM | POA: Diagnosis not present

## 2021-11-27 ENCOUNTER — Other Ambulatory Visit: Payer: Self-pay | Admitting: Registered Nurse

## 2021-11-27 DIAGNOSIS — R600 Localized edema: Secondary | ICD-10-CM | POA: Diagnosis not present

## 2021-11-27 DIAGNOSIS — L03115 Cellulitis of right lower limb: Secondary | ICD-10-CM | POA: Diagnosis not present

## 2021-11-28 ENCOUNTER — Ambulatory Visit
Admission: RE | Admit: 2021-11-28 | Discharge: 2021-11-28 | Disposition: A | Discharge status: Home / Self Care | Payer: Medicare Other | Source: Ambulatory Visit | Attending: Registered Nurse | Admitting: Registered Nurse

## 2021-11-28 DIAGNOSIS — M7989 Other specified soft tissue disorders: Secondary | ICD-10-CM | POA: Diagnosis not present

## 2021-11-28 DIAGNOSIS — R600 Localized edema: Secondary | ICD-10-CM

## 2021-11-28 DIAGNOSIS — R590 Localized enlarged lymph nodes: Secondary | ICD-10-CM | POA: Diagnosis not present

## 2021-12-03 DIAGNOSIS — L03115 Cellulitis of right lower limb: Secondary | ICD-10-CM | POA: Diagnosis not present

## 2021-12-03 DIAGNOSIS — R600 Localized edema: Secondary | ICD-10-CM | POA: Diagnosis not present

## 2021-12-17 ENCOUNTER — Other Ambulatory Visit: Payer: Self-pay | Admitting: Student

## 2021-12-17 ENCOUNTER — Other Ambulatory Visit: Payer: Self-pay | Admitting: Cardiology

## 2021-12-25 DIAGNOSIS — R350 Frequency of micturition: Secondary | ICD-10-CM | POA: Diagnosis not present

## 2022-01-08 DIAGNOSIS — I10 Essential (primary) hypertension: Secondary | ICD-10-CM | POA: Diagnosis not present

## 2022-01-08 DIAGNOSIS — E039 Hypothyroidism, unspecified: Secondary | ICD-10-CM | POA: Diagnosis not present

## 2022-01-08 DIAGNOSIS — E7800 Pure hypercholesterolemia, unspecified: Secondary | ICD-10-CM | POA: Diagnosis not present

## 2022-01-13 DIAGNOSIS — R600 Localized edema: Secondary | ICD-10-CM | POA: Diagnosis not present

## 2022-01-13 DIAGNOSIS — F320 Major depressive disorder, single episode, mild: Secondary | ICD-10-CM | POA: Diagnosis not present

## 2022-01-13 DIAGNOSIS — D6869 Other thrombophilia: Secondary | ICD-10-CM | POA: Diagnosis not present

## 2022-01-13 DIAGNOSIS — Z8673 Personal history of transient ischemic attack (TIA), and cerebral infarction without residual deficits: Secondary | ICD-10-CM | POA: Diagnosis not present

## 2022-01-13 DIAGNOSIS — Z0000 Encounter for general adult medical examination without abnormal findings: Secondary | ICD-10-CM | POA: Diagnosis not present

## 2022-01-13 DIAGNOSIS — I480 Paroxysmal atrial fibrillation: Secondary | ICD-10-CM | POA: Diagnosis not present

## 2022-01-13 DIAGNOSIS — R32 Unspecified urinary incontinence: Secondary | ICD-10-CM | POA: Diagnosis not present

## 2022-01-13 DIAGNOSIS — R29898 Other symptoms and signs involving the musculoskeletal system: Secondary | ICD-10-CM | POA: Diagnosis not present

## 2022-01-13 DIAGNOSIS — M810 Age-related osteoporosis without current pathological fracture: Secondary | ICD-10-CM | POA: Diagnosis not present

## 2022-01-13 DIAGNOSIS — B0229 Other postherpetic nervous system involvement: Secondary | ICD-10-CM | POA: Diagnosis not present

## 2022-01-13 DIAGNOSIS — E039 Hypothyroidism, unspecified: Secondary | ICD-10-CM | POA: Diagnosis not present

## 2022-01-13 DIAGNOSIS — I10 Essential (primary) hypertension: Secondary | ICD-10-CM | POA: Diagnosis not present

## 2022-01-17 ENCOUNTER — Other Ambulatory Visit: Payer: Self-pay | Admitting: Cardiology

## 2022-01-20 ENCOUNTER — Ambulatory Visit: Payer: Medicare Other | Attending: Internal Medicine | Admitting: Rehabilitative and Restorative Service Providers"

## 2022-01-20 ENCOUNTER — Encounter: Payer: Self-pay | Admitting: Rehabilitative and Restorative Service Providers"

## 2022-01-20 DIAGNOSIS — R262 Difficulty in walking, not elsewhere classified: Secondary | ICD-10-CM | POA: Insufficient documentation

## 2022-01-20 DIAGNOSIS — M6281 Muscle weakness (generalized): Secondary | ICD-10-CM | POA: Diagnosis not present

## 2022-01-20 DIAGNOSIS — R2689 Other abnormalities of gait and mobility: Secondary | ICD-10-CM | POA: Diagnosis not present
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
## 2022-01-22 DIAGNOSIS — R350 Frequency of micturition: Secondary | ICD-10-CM | POA: Diagnosis not present

## 2022-01-26 ENCOUNTER — Ambulatory Visit: Payer: Medicare Other | Attending: Internal Medicine | Admitting: Physical Therapy

## 2022-01-26 ENCOUNTER — Encounter: Payer: Self-pay | Admitting: Physical Therapy

## 2022-01-26 DIAGNOSIS — R2689 Other abnormalities of gait and mobility: Secondary | ICD-10-CM | POA: Insufficient documentation

## 2022-01-26 DIAGNOSIS — M6281 Muscle weakness (generalized): Secondary | ICD-10-CM | POA: Diagnosis not present

## 2022-01-26 DIAGNOSIS — R262 Difficulty in walking, not elsewhere classified: Secondary | ICD-10-CM | POA: Insufficient documentation

## 2022-01-26 NOTE — Therapy (Signed)
OUTPATIENT PHYSICAL THERAPY TREATMENT NOTE   Patient Name: MINDEE ROBLEDO MRN: [id number] DOB:1929-07-02, 88 y.o., female Today's Date: 01/26/2022  PCP:  Deland Pretty, MD  REFERRING PROVIDER: Deland Pretty, MD   END OF SESSION:   PT End of Session - 01/26/22 1152     Visit Number 2    Date for PT Re-Evaluation 03/13/22    Authorization Type Medicare    Progress Note Due on Visit 10    PT Start Time 1152    Activity Tolerance Patient tolerated treatment well    Behavior During Therapy [facility] for tasks assessed/performed             Past Medical History:  Diagnosis Date   Arthritis    spine   Atrial fibrillation ([place])    Brain aneurysm 01/07/2017   Dysrhythmia    afib   H/O cerebral aneurysm repair ACA S/P Coiling by Dr. Estanislado Pandy 01/07/17 01/07/2017   Headache    with Eliquis   Hypertension    Hypothyroidism    Maternal blood transfusion    Neuromuscular disorder ([place])    spinal injury- "I'm unsure of walking, I'm very careful"   Shingles    Stroke ([place]) 11/2015   Past Surgical History:  Procedure Laterality Date   ABDOMINAL HYSTERECTOMY     ovaries removed   APPENDECTOMY     CARPAL TUNNEL RELEASE Bilateral    CATARACT EXTRACTION     CHOLECYSTECTOMY     IR ANGIO INTRA EXTRACRAN SEL COM CAROTID INNOMINATE BILAT MOD SED  12/30/2016   IR ANGIO INTRA EXTRACRAN SEL COM CAROTID INNOMINATE BILAT MOD SED  03/08/2018   IR ANGIO INTRA EXTRACRAN SEL INTERNAL CAROTID UNI R MOD SED  01/07/2017   IR ANGIO VERTEBRAL SEL SUBCLAVIAN INNOMINATE UNI R MOD SED  12/30/2016   IR ANGIO VERTEBRAL SEL VERTEBRAL BILAT MOD SED  03/08/2018   IR ANGIO VERTEBRAL SEL VERTEBRAL UNI L MOD SED  12/30/2016   IR ANGIOGRAM FOLLOW UP STUDY  01/07/2017   IR ANGIOGRAM FOLLOW UP STUDY  01/07/2017   IR ANGIOGRAM FOLLOW UP STUDY  01/07/2017   IR NEURO EACH ADD'L AFTER BASIC UNI RIGHT (MS)  01/07/2017   IR RADIOLOGIST EVAL & MGMT  11/24/2016   IR RADIOLOGIST EVAL & MGMT  01/25/2017   IR  TRANSCATH/EMBOLIZ  01/07/2017   JOINT REPLACEMENT Bilateral    arthroscopic - knees   RADIOLOGY WITH ANESTHESIA N/A 12/30/2016   Procedure: RADIOLOGY WITH ANESTHESIA  EMBOLIZATION;  Surgeon: Luanne Bras, MD;  Location: [facility];  Service: Radiology;  Laterality: N/A;   RADIOLOGY WITH ANESTHESIA N/A 01/07/2017   Procedure: EMBOLIZATION;  Surgeon: Luanne Bras, MD;  Location: [facility];  Service: Radiology;  Laterality: N/A;   Patient Active Problem List   Diagnosis Date Noted   BPPV (benign paroxysmal positional vertigo) 04/16/2017   History of stroke 03/09/2017   H/O cerebral aneurysm repair ACA S/P Coiling by Dr. Estanislado Pandy 01/07/17 01/07/2017   Chronic anticoagulation 04/16/2016   Speech disturbance 12/17/2015   Atrial fibrillation ([place]) 12/17/2015   Hypertension 12/17/2015   Hyperlipidemia 12/17/2015   Dizziness 12/17/2015   Stroke (cerebrum) ([facility])     REFERRING DIAG: R29.898 (ICD-10-CM) - Leg weakness     THERAPY DIAG:  Other abnormalities of gait and mobility  Muscle weakness (generalized)  Difficulty in walking, not elsewhere classified  Rationale for Evaluation and Treatment Rehabilitation  PERTINENT HISTORY: HTN, Paroxysmal Afib, bilat MCA and ACA multifocal punctate, Hx of cerebral aneurysm repair ACA s/p coiling  on 01/07/17, OA  PRECAUTIONS:  Fall  SUBJECTIVE: I mopped the floor this AM.   PAIN:  Are you having pain? No   OBJECTIVE: (objective measures completed at initial evaluation unless otherwise dated)  DIAGNOSTIC FINDINGS:    PATIENT SURVEYS:  ABC scale 22% The Activities-specific Balance Confidence (ABC) Scale:   0%      '10       20       30       '$ 40       50       60       70       80       90       100% No Confidence                                                                                      Completely Confident   How confident are you that you will not lose your balance or become unsteady when you.   .walk around the house?  80% .walk up or down stairs?  20% .bend over and pick up a slipper from the front of a closet floor? 0% .reach for a small can off a shelf at eye level? 100% .stand on your tip toes and reach for something above your head? 0% .stand on a chair and reach for something? 0% .sweep the floor? 50% .walk outside the house to a car parked in the driveway? 30% .get into or out of a car? 50% .walk across a parking lot to the mall? 0% .walk up or down a ramp? 0% .walk in a crowded mall where people rapidly walk past you? 0% .are bumped into by people as you walk through the mall? 0% .step onto or off of an escalator while you are holding onto a railing? 20% .step onto or off an escalator while you are holding onto parcels such that you cannot hold onto the railing? 0% .walk outside on icy sidewalks? 0%   Total ABC score: 350   Scoring:  350/16=22% of self confidence     COGNITION:           Overall cognitive status: Within functional limits for tasks assessed                          SENSATION: WFL   EDEMA:  Minimal edema noted in left LE, cardiologist aware   MUSCLE LENGTH: Hamstring tightness noted   POSTURE: rounded shoulders, forward head, and flexed trunk    LOWER EXTREMITY MMT: 01/20/2022:  RLE strength of 4/5, LLE strength of 4-/5   FUNCTIONAL TESTS:  5 times sit to stand: 30.99 sec with use of UE Timed up and go (TUG): 35.77 sec without use of AD with CGA   GAIT: Distance walked: 50 ft Assistive device utilized: None Level of assistance: CGA and Min A Comments: Attempted ambulation in clinic with RW and pt requires SBA with cuing for looking ahead instead of down at floor.       TODAY'S TREATMENT:  01/26/22: Review and performance of initial HEP: No significant cuing  needed. Gave pt new papers. Sit to stand from chair 5x2  VC to use LE > UE Walking: 85 feet with RW Nustep L1 1 min, rest break 1 min then repeat 2x more   01/20/2022: Seated:  LAQ, marching,  heel/toe raises, hip adduction ball squeeze.  BLE x10 reps each     PATIENT EDUCATION:  Education details: Issued HEP Person educated: Patient and Child(ren) Education method: Explanation, Demonstration, and Handouts Education comprehension: verbalized understanding and returned demonstration     HOME EXERCISE PROGRAM: Access Code: [email or web address] URL: [email or web address] Date: 01/20/2022 Prepared by: Juel Burrow   Exercises - Seated Long Arc Quad  - 1 x daily - 7 x weekly - 2 sets - 10 reps - Seated March  - 1 x daily - 7 x weekly - 2 sets - 10 reps - Seated Heel Raise  - 1 x daily - 7 x weekly - 2 sets - 10 reps - Seated Hip Adduction Isometrics with Ball  - 1 x daily - 7 x weekly - 2 sets - 10 reps   ASSESSMENT:   CLINICAL IMPRESSION: Pt arrives to PT with daughter who remained present for duration of treatment. Pt reports she did not know she had exercises to do at home. We reviewed her initial HEP and she could perform all correctly.  PTA gave pt new papers. Pt was educated in getting out of a chair using her LE more than her UE. Pt was able to walk 85 feet with RW today and no back pain.   OBJECTIVE IMPAIRMENTS decreased balance, difficulty walking, decreased strength, postural dysfunction, and pain.    ACTIVITY LIMITATIONS lifting, bending, standing, and stairs   PARTICIPATION LIMITATIONS: cleaning, shopping, and community activity   PERSONAL FACTORS 3+ comorbidities: Hx of CVA, Hx of aneurysm repair, OA  are also affecting patient's functional outcome.    REHAB POTENTIAL: Good   CLINICAL DECISION MAKING: Evolving/moderate complexity   EVALUATION COMPLEXITY: Moderate     GOALS: Goals reviewed with patient? Yes   SHORT TERM GOALS: Target date: 02/10/2022    Pt will be independent with initial HEP. Baseline: Goal status: INITIAL   2.  Pt to be able to demonstrate safe ambulation in clinic with use of RW. Baseline:  Goal status: INITIAL     LONG  TERM GOALS: Target date: 03/17/2022    Pt will be independent with advanced HEP. Baseline:  Goal status: INITIAL   2.  Pt able to negotiate ambulation up a ramp with LRAD and mod I to allow her to visit her daughter. Baseline:  Goal status: INITIAL   3.  Pt to increase ABC Confidence Scale to at least 50% to demonstrate decreased risk of falling. Baseline: 22% Goal status: INITIAL   4.  Pt will increase BLE strength to at least 4+/5 to allow pt to perform transfers with increased ease. Baseline:  Goal status: INITIAL              5.  Pt will improve TUG to less than 20 seconds with LRAD to decrease her risk of falling.                       Baseline: 36 sec without assistive device and CGA                       Goal status:  INITIAL     PLAN: PT FREQUENCY: 2x/week   PT DURATION: 8 weeks  PLANNED INTERVENTIONS: Therapeutic exercises, Therapeutic activity, Neuromuscular re-education, Balance training, Gait training, Patient/Family education, Joint manipulation, Joint mobilization, Stair training, Aquatic Therapy, Dry Needling, Electrical stimulation, Spinal manipulation, Spinal mobilization, Cryotherapy, Moist heat, Taping, Traction, Ultrasound, Ionotophoresis '4mg'$ /ml Dexamethasone, Manual therapy, and Re-evaluation   PLAN FOR NEXT SESSION: Slowly progress LE strengthening exercises, walking for strength and endurance ( pt may bring her current RW) and begin beginner balance exercises.    Jullian Clayson, PTA 01/26/2022, 11:53 AM

## 2022-01-30 ENCOUNTER — Ambulatory Visit: Payer: Medicare Other | Admitting: Physical Therapy

## 2022-01-30 ENCOUNTER — Encounter: Payer: Self-pay | Admitting: Physical Therapy

## 2022-01-30 DIAGNOSIS — R262 Difficulty in walking, not elsewhere classified: Secondary | ICD-10-CM | POA: Diagnosis not present

## 2022-01-30 DIAGNOSIS — R2689 Other abnormalities of gait and mobility: Secondary | ICD-10-CM | POA: Diagnosis not present

## 2022-01-30 DIAGNOSIS — M6281 Muscle weakness (generalized): Secondary | ICD-10-CM | POA: Diagnosis not present

## 2022-01-30 NOTE — Therapy (Signed)
OUTPATIENT PHYSICAL THERAPY TREATMENT NOTE   Patient Name: Sara Frye MRN: [id number] DOB:16-Aug-1929, 88 y.o., female Today's Date: 01/30/2022  PCP:  Deland Pretty, MD  REFERRING PROVIDER: Deland Pretty, MD   END OF SESSION:   PT End of Session - 01/30/22 1020     Visit Number 3    Date for PT Re-Evaluation 03/13/22    Authorization Type Medicare    Progress Note Due on Visit 10    PT Start Time 1020    PT Stop Time 1054    PT Time Calculation (min) 34 min    Activity Tolerance Patient tolerated treatment well;Patient limited by fatigue    Behavior During Therapy [facility] for tasks assessed/performed             Past Medical History:  Diagnosis Date   Arthritis    spine   Atrial fibrillation ([place])    Brain aneurysm 01/07/2017   Dysrhythmia    afib   H/O cerebral aneurysm repair ACA S/P Coiling by Dr. Estanislado Pandy 01/07/17 01/07/2017   Headache    with Eliquis   Hypertension    Hypothyroidism    Maternal blood transfusion    Neuromuscular disorder ([place])    spinal injury- "I'm unsure of walking, I'm very careful"   Shingles    Stroke ([place]) 11/2015   Past Surgical History:  Procedure Laterality Date   ABDOMINAL HYSTERECTOMY     ovaries removed   APPENDECTOMY     CARPAL TUNNEL RELEASE Bilateral    CATARACT EXTRACTION     CHOLECYSTECTOMY     IR ANGIO INTRA EXTRACRAN SEL COM CAROTID INNOMINATE BILAT MOD SED  12/30/2016   IR ANGIO INTRA EXTRACRAN SEL COM CAROTID INNOMINATE BILAT MOD SED  03/08/2018   IR ANGIO INTRA EXTRACRAN SEL INTERNAL CAROTID UNI R MOD SED  01/07/2017   IR ANGIO VERTEBRAL SEL SUBCLAVIAN INNOMINATE UNI R MOD SED  12/30/2016   IR ANGIO VERTEBRAL SEL VERTEBRAL BILAT MOD SED  03/08/2018   IR ANGIO VERTEBRAL SEL VERTEBRAL UNI L MOD SED  12/30/2016   IR ANGIOGRAM FOLLOW UP STUDY  01/07/2017   IR ANGIOGRAM FOLLOW UP STUDY  01/07/2017   IR ANGIOGRAM FOLLOW UP STUDY  01/07/2017   IR NEURO EACH ADD'L AFTER BASIC UNI RIGHT (MS)  01/07/2017   IR  RADIOLOGIST EVAL & MGMT  11/24/2016   IR RADIOLOGIST EVAL & MGMT  01/25/2017   IR TRANSCATH/EMBOLIZ  01/07/2017   JOINT REPLACEMENT Bilateral    arthroscopic - knees   RADIOLOGY WITH ANESTHESIA N/A 12/30/2016   Procedure: RADIOLOGY WITH ANESTHESIA  EMBOLIZATION;  Surgeon: Luanne Bras, MD;  Location: [place];  Service: Radiology;  Laterality: N/A;   RADIOLOGY WITH ANESTHESIA N/A 01/07/2017   Procedure: EMBOLIZATION;  Surgeon: Luanne Bras, MD;  Location: [place];  Service: Radiology;  Laterality: N/A;   Patient Active Problem List   Diagnosis Date Noted   BPPV (benign paroxysmal positional vertigo) 04/16/2017   History of stroke 03/09/2017   H/O cerebral aneurysm repair ACA S/P Coiling by Dr. Estanislado Pandy 01/07/17 01/07/2017   Chronic anticoagulation 04/16/2016   Speech disturbance 12/17/2015   Atrial fibrillation ([place]) 12/17/2015   Hypertension 12/17/2015   Hyperlipidemia 12/17/2015   Dizziness 12/17/2015   Stroke (cerebrum) (HCC)     REFERRING DIAG: R29.898 (ICD-10-CM) - Leg weakness     THERAPY DIAG:  Other abnormalities of gait and mobility  Muscle weakness (generalized)  Difficulty in walking, not elsewhere classified  Rationale for Evaluation and Treatment Rehabilitation  PERTINENT HISTORY: HTN, Paroxysmal Afib, bilat MCA and ACA multifocal punctate, Hx of cerebral aneurysm repair ACA s/p coiling on 01/07/17, OA  PRECAUTIONS:  Fall  SUBJECTIVE: I did well after the last exercise session. Rt thigh was sore. I am already tired from doing my chores this AM my bike and my other exercises. PAIN:  Are you having pain? No   OBJECTIVE: (objective measures completed at initial evaluation unless otherwise dated)  DIAGNOSTIC FINDINGS:    PATIENT SURVEYS:  ABC scale 22% The Activities-specific Balance Confidence (ABC) Scale:   0%      _0 40       50       60       70       80       90       100% No Confidence                                                                                       Completely Confident   How confident are you that you will not lose your balance or become unsteady when you.   .walk around the house? 80% .walk up or down stairs?  20% .bend over and pick up a slipper from the front of a closet floor? 0% .reach for a small can off a shelf at eye level? 100% .stand on your tip toes and reach for something above your head? 0% .stand on a chair and reach for something? 0% .sweep the floor? 50% .walk outside the house to a car parked in the driveway? 51% .get into or out of a car? 50% .walk across a parking lot to the mall? 0% .walk up or down a ramp? 0% .walk in a crowded mall where people rapidly walk past you? 0% .are bumped into by people as you walk through the mall? 0% .step onto or off of an escalator while you are holding onto a railing? 20% .step onto or off an escalator while you are holding onto parcels such that you cannot hold onto the railing? 0% .walk outside on icy sidewalks? 0%   Total ABC score: 350   Scoring:  350/16=22% of self confidence     COGNITION:           Overall cognitive status: Within functional limits for tasks assessed                          SENSATION: WFL   EDEMA:  Minimal edema noted in left LE, cardiologist aware   MUSCLE LENGTH: Hamstring tightness noted   POSTURE: rounded shoulders, forward head, and flexed trunk    LOWER EXTREMITY MMT: 01/20/2022:  RLE strength of 4/5, LLE strength of 4-/5   FUNCTIONAL TESTS:  5 times sit to stand: 30.99 sec with use of UE Timed up and go (TUG): 35.77 sec without use of AD with CGA   GAIT: Distance walked: 50 ft Assistive device utilized: None Level of assistance: CGA and Min A Comments:  Attempted ambulation in clinic with RW and pt requires SBA with cuing for looking ahead instead of down at floor.       TODAY'S TREATMENT:  01/30/22: Nustep L1 3 min, stopped due to LE fatigue. Ball squeeze 15x LAQ 1#  added 10x Bil Seated marching 10x with 1# Standing heel lifts 10x Standing marching 10x Gait for endurance 85 feet with RW 2x with seated rest break in between.VC to reach back for arm rests of chair when sitting vs holding on to walker.  Sit to stand 5x from chair VC to use LE > arms  01/26/22: Review and performance of initial HEP: No significant cuing needed. Gave pt new papers. Sit to stand from chair 5x2  VC to use LE > UE Walking: 85 feet with RW Nustep L1 1 min, rest break 1 min then repeat 2x more   01/20/2022: Seated:  LAQ, marching, heel/toe raises, hip adduction ball squeeze.  BLE x10 reps each     PATIENT EDUCATION:  Education details: Issued HEP Person educated: Patient and Child(ren) Education method: Explanation, Demonstration, and Handouts Education comprehension: verbalized understanding and returned demonstration     HOME EXERCISE PROGRAM: Access Code: [email or web address] URL: [email or web address] Date: 01/20/2022 Prepared by: Juel Burrow   Exercises - Seated Long Arc Quad  - 1 x daily - 7 x weekly - 2 sets - 10 reps - Seated March  - 1 x daily - 7 x weekly - 2 sets - 10 reps - Seated Heel Raise  - 1 x daily - 7 x weekly - 2 sets - 10 reps - Seated Hip Adduction Isometrics with Ball  - 1 x daily - 7 x weekly - 2 sets - 10 reps   ASSESSMENT:   CLINICAL IMPRESSION: Pt arrives today for PT with no pain. She is now compliant with with initial HEP. She is a little fatigued already from doing her HEP and doing all her chores this AM. Pt was able to ambulate 85 feet 2x today with a seated rest break due to "catch her breath." All short term goals met today. Instructed pt to increase her reps at home with initially HEP.   OBJECTIVE IMPAIRMENTS decreased balance, difficulty walking, decreased strength, postural dysfunction, and pain.    ACTIVITY LIMITATIONS lifting, bending, standing, and stairs   PARTICIPATION LIMITATIONS: cleaning, shopping, and community  activity   PERSONAL FACTORS 3+ comorbidities: Hx of CVA, Hx of aneurysm repair, OA  are also affecting patient's functional outcome.    REHAB POTENTIAL: Good   CLINICAL DECISION MAKING: Evolving/moderate complexity   EVALUATION COMPLEXITY: Moderate     GOALS: Goals reviewed with patient? Yes   SHORT TERM GOALS: Target date: 02/10/2022    Pt will be independent with initial HEP. Baseline: Goal status: Met  2.  Pt to be able to demonstrate safe ambulation in clinic with use of RW. Baseline:  Goal status: Met     LONG TERM GOALS: Target date: 03/17/2022    Pt will be independent with advanced HEP. Baseline:  Goal status: INITIAL   2.  Pt able to negotiate ambulation up a ramp with LRAD and mod I to allow her to visit her daughter. Baseline:  Goal status: INITIAL   3.  Pt to increase ABC Confidence Scale to at least 50% to demonstrate decreased risk of falling. Baseline: 22% Goal status: INITIAL   4.  Pt will increase BLE strength to at least 4+/5 to allow pt to perform transfers with increased ease. Baseline:  Goal status: INITIAL              5.  Pt will improve TUG to less than 20 seconds with LRAD to decrease her risk of falling.                       Baseline: 36 sec without assistive device and CGA                       Goal status:  INITIAL     PLAN: PT FREQUENCY: 2x/week   PT DURATION: 8 weeks   PLANNED INTERVENTIONS: Therapeutic exercises, Therapeutic activity, Neuromuscular re-education, Balance training, Gait training, Patient/Family education, Joint manipulation, Joint mobilization, Stair training, Aquatic Therapy, Dry Needling, Electrical stimulation, Spinal manipulation, Spinal mobilization, Cryotherapy, Moist heat, Taping, Traction, Ultrasound, Ionotophoresis 30m/ml Dexamethasone, Manual therapy, and Re-evaluation   PLAN FOR NEXT SESSION: Slowly progress LE strengthening exercises, walking for strength and endurance ( pt may bring her current RW) and  begin beginner balance exercises.    COCHRAN,JENNIFER, PTA 01/30/2022, 10:55 AM

## 2022-02-04 ENCOUNTER — Ambulatory Visit: Payer: Medicare Other

## 2022-02-04 DIAGNOSIS — R262 Difficulty in walking, not elsewhere classified: Secondary | ICD-10-CM

## 2022-02-04 DIAGNOSIS — R2689 Other abnormalities of gait and mobility: Secondary | ICD-10-CM

## 2022-02-04 DIAGNOSIS — M6281 Muscle weakness (generalized): Secondary | ICD-10-CM

## 2022-02-04 NOTE — Therapy (Signed)
   Pt arrived and was not feeling well.  She had an episode of high BP 3 days ago and is going to see her MD when her daughter gets back from the beach.  Pt didn't stay for treatment.    Sigurd Sos, PT 02/04/22 11:14 AM

## 2022-02-05 ENCOUNTER — Ambulatory Visit: Payer: Medicare Other

## 2022-02-05 DIAGNOSIS — R262 Difficulty in walking, not elsewhere classified: Secondary | ICD-10-CM | POA: Diagnosis not present

## 2022-02-05 DIAGNOSIS — M6281 Muscle weakness (generalized): Secondary | ICD-10-CM | POA: Diagnosis not present

## 2022-02-05 DIAGNOSIS — R2689 Other abnormalities of gait and mobility: Secondary | ICD-10-CM | POA: Diagnosis not present

## 2022-02-05 NOTE — Therapy (Signed)
OUTPATIENT PHYSICAL THERAPY TREATMENT NOTE   Patient Name: Sara Frye MRN: [id number] DOB:1928/11/10, 88 y.o., female Today's Date: 02/05/2022  PCP:  Deland Pretty, MD  REFERRING PROVIDER: Deland Pretty, MD   END OF SESSION:   PT End of Session - 02/05/22 1010     Visit Number 4    Date for PT Re-Evaluation 03/13/22    Authorization Type Medicare    Progress Note Due on Visit 10    PT Start Time 0932    PT Stop Time 1011    PT Time Calculation (min) 39 min    Activity Tolerance Patient limited by fatigue    Behavior During Therapy [facility] for tasks assessed/performed              Past Medical History:  Diagnosis Date   Arthritis    spine   Atrial fibrillation ([place])    Brain aneurysm 01/07/2017   Dysrhythmia    afib   H/O cerebral aneurysm repair ACA S/P Coiling by Dr. Estanislado Pandy 01/07/17 01/07/2017   Headache    with Eliquis   Hypertension    Hypothyroidism    Maternal blood transfusion    Neuromuscular disorder ([place])    spinal injury- "I'm unsure of walking, I'm very careful"   Shingles    Stroke ([place]) 11/2015   Past Surgical History:  Procedure Laterality Date   ABDOMINAL HYSTERECTOMY     ovaries removed   APPENDECTOMY     CARPAL TUNNEL RELEASE Bilateral    CATARACT EXTRACTION     CHOLECYSTECTOMY     IR ANGIO INTRA EXTRACRAN SEL COM CAROTID INNOMINATE BILAT MOD SED  12/30/2016   IR ANGIO INTRA EXTRACRAN SEL COM CAROTID INNOMINATE BILAT MOD SED  03/08/2018   IR ANGIO INTRA EXTRACRAN SEL INTERNAL CAROTID UNI R MOD SED  01/07/2017   IR ANGIO VERTEBRAL SEL SUBCLAVIAN INNOMINATE UNI R MOD SED  12/30/2016   IR ANGIO VERTEBRAL SEL VERTEBRAL BILAT MOD SED  03/08/2018   IR ANGIO VERTEBRAL SEL VERTEBRAL UNI L MOD SED  12/30/2016   IR ANGIOGRAM FOLLOW UP STUDY  01/07/2017   IR ANGIOGRAM FOLLOW UP STUDY  01/07/2017   IR ANGIOGRAM FOLLOW UP STUDY  01/07/2017   IR NEURO EACH ADD'L AFTER BASIC UNI RIGHT (MS)  01/07/2017   IR RADIOLOGIST EVAL & MGMT  11/24/2016    IR RADIOLOGIST EVAL & MGMT  01/25/2017   IR TRANSCATH/EMBOLIZ  01/07/2017   JOINT REPLACEMENT Bilateral    arthroscopic - knees   RADIOLOGY WITH ANESTHESIA N/A 12/30/2016   Procedure: RADIOLOGY WITH ANESTHESIA  EMBOLIZATION;  Surgeon: Luanne Bras, MD;  Location: [place];  Service: Radiology;  Laterality: N/A;   RADIOLOGY WITH ANESTHESIA N/A 01/07/2017   Procedure: EMBOLIZATION;  Surgeon: Luanne Bras, MD;  Location: [place];  Service: Radiology;  Laterality: N/A;   Patient Active Problem List   Diagnosis Date Noted   BPPV (benign paroxysmal positional vertigo) 04/16/2017   History of stroke 03/09/2017   H/O cerebral aneurysm repair ACA S/P Coiling by Dr. Estanislado Pandy 01/07/17 01/07/2017   Chronic anticoagulation 04/16/2016   Speech disturbance 12/17/2015   Atrial fibrillation ([place]) 12/17/2015   Hypertension 12/17/2015   Hyperlipidemia 12/17/2015   Dizziness 12/17/2015   Stroke (cerebrum) (HCC)     REFERRING DIAG: R29.898 (ICD-10-CM) - Leg weakness     THERAPY DIAG:  Other abnormalities of gait and mobility  Muscle weakness (generalized)  Difficulty in walking, not elsewhere classified  Rationale for Evaluation and Treatment Rehabilitation  PERTINENT HISTORY:  HTN, Paroxysmal Afib, bilat MCA and ACA multifocal punctate, Hx of cerebral aneurysm repair ACA s/p coiling on 01/07/17, OA  PRECAUTIONS:  Fall  SUBJECTIVE: I am feeling better today.   I did my exercises this morning.   PAIN:  Are you having pain? No   OBJECTIVE: (objective measures completed at initial evaluation unless otherwise dated)  DIAGNOSTIC FINDINGS:    PATIENT SURVEYS:  ABC scale 22% The Activities-specific Balance Confidence (ABC) Scale:   0%      10       20       30        40       50       60       70       80       90       100% No Confidence                                                                                      Completely Confident   How confident are you that you will not  lose your balance or become unsteady when you.   .walk around the house? 80% .walk up or down stairs?  20% .bend over and pick up a slipper from the front of a closet floor? 0% .reach for a small can off a shelf at eye level? 100% .stand on your tip toes and reach for something above your head? 0% .stand on a chair and reach for something? 0% .sweep the floor? 50% .walk outside the house to a car parked in the driveway? 98% .get into or out of a car? 50% .walk across a parking lot to the mall? 0% .walk up or down a ramp? 0% .walk in a crowded mall where people rapidly walk past you? 0% .are bumped into by people as you walk through the mall? 0% .step onto or off of an escalator while you are holding onto a railing? 20% .step onto or off an escalator while you are holding onto parcels such that you cannot hold onto the railing? 0% .walk outside on icy sidewalks? 0%   Total ABC score: 350   Scoring:  350/16=22% of self confidence     COGNITION:           Overall cognitive status: Within functional limits for tasks assessed                          SENSATION: WFL   EDEMA:  Minimal edema noted in left LE, cardiologist aware   MUSCLE LENGTH: Hamstring tightness noted   POSTURE: rounded shoulders, forward head, and flexed trunk    LOWER EXTREMITY MMT: 01/20/2022:  RLE strength of 4/5, LLE strength of 4-/5   FUNCTIONAL TESTS:  5 times sit to stand: 30.99 sec with use of UE Timed up and go (TUG): 35.77 sec without use of AD with CGA   GAIT: Distance walked: 50 ft Assistive device utilized: None Level of assistance: CGA and Min A Comments: Attempted ambulation in clinic with RW and pt requires SBA with cuing for looking ahead  instead of down at floor.       TODAY'S TREATMENT: 02/05/22:  Diona Foley squeeze 15x 5" hold LAQ 1# added 2x10 Bil Seated marching 10x with 1# Standing heel raises: x10 Standing hip abduction 2x5 each Standing heel/toe raises x20 Standing marching  10x Gait for endurance: from ortho to cancer gym.  Sitting rest break x 2 minutes and returned to Ortho gym.   Sit to stand 5x from chair VC to use LE > arms  01/30/22: Nustep L1 3 min, stopped due to LE fatigue. Ball squeeze 15x LAQ 1# added 10x Bil Seated marching 10x with 1# Standing heel lifts 10x Standing marching 10x Gait for endurance 85 feet with RW 2x with seated rest break in between.VC to reach back for arm rests of chair when sitting vs holding on to walker.  Sit to stand 5x from chair VC to use LE > arms  01/26/22: Review and performance of initial HEP: No significant cuing needed. Gave pt new papers. Sit to stand from chair 5x2  VC to use LE > UE Walking: 85 feet with RW Nustep L1 1 min, rest break 1 min then repeat 2x more      PATIENT EDUCATION:  Education details: Issued HEP Person educated: Patient and Child(ren) Education method: Explanation, Demonstration, and Handouts Education comprehension: verbalized understanding and returned demonstration     HOME EXERCISE PROGRAM: Access Code: [email or web address] URL: [email or web address] Date: 02/05/2022 Prepared by: Claiborne Billings  Exercises - Seated Long Arc Quad  - 1 x daily - 7 x weekly - 2 sets - 10 reps - Seated March  - 1 x daily - 7 x weekly - 2 sets - 10 reps - Seated Heel Raise  - 1 x daily - 7 x weekly - 2 sets - 10 reps - Seated Hip Adduction Isometrics with Ball  - 1 x daily - 7 x weekly - 2 sets - 10 reps - Heel Raises with Counter Support  - 1 x daily - 7 x weekly - 2 sets - 10 reps - Standing Hip Abduction with Counter Support  - 1 x daily - 7 x weekly - 2 sets - 5 reps ASSESSMENT:   CLINICAL IMPRESSION: Pt had to cancel yesterday due to not feeling well.  She reports compliance with her HEP today.  Pt is not using her rolling walker at home and demonstrates instability with gait without device . PT advised pt and her daughter to use the walker at home for safety.  Pt with overall improved posture with  scapular retraction and upright trunk and requires minor verbal cues for posture.  PT monitored pt for fatigue and safety throughout sessions. Pt with improved endurance for walking in the clinic today.  Patient will benefit from skilled PT to address the below impairments and improve overall function.   OBJECTIVE IMPAIRMENTS decreased balance, difficulty walking, decreased strength, postural dysfunction, and pain.    ACTIVITY LIMITATIONS lifting, bending, standing, and stairs   PARTICIPATION LIMITATIONS: cleaning, shopping, and community activity   PERSONAL FACTORS 3+ comorbidities: Hx of CVA, Hx of aneurysm repair, OA  are also affecting patient's functional outcome.    REHAB POTENTIAL: Good   CLINICAL DECISION MAKING: Evolving/moderate complexity   EVALUATION COMPLEXITY: Moderate     GOALS: Goals reviewed with patient? Yes   SHORT TERM GOALS: Target date: 02/10/2022    Pt will be independent with initial HEP. Baseline: Goal status: Met  2.  Pt to be able to demonstrate safe ambulation in clinic with  use of RW. Baseline:  Goal status: Met     LONG TERM GOALS: Target date: 03/17/2022    Pt will be independent with advanced HEP. Baseline:  Goal status: INITIAL   2.  Pt able to negotiate ambulation up a ramp with LRAD and mod I to allow her to visit her daughter. Baseline:  Goal status: INITIAL   3.  Pt to increase ABC Confidence Scale to at least 50% to demonstrate decreased risk of falling. Baseline: 22% Goal status: INITIAL   4.  Pt will increase BLE strength to at least 4+/5 to allow pt to perform transfers with increased ease. Baseline:  Goal status: INITIAL              5.  Pt will improve TUG to less than 20 seconds with LRAD to decrease her risk of falling.                       Baseline: 36 sec without assistive device and CGA                       Goal status:  INITIAL     PLAN: PT FREQUENCY: 2x/week   PT DURATION: 8 weeks   PLANNED INTERVENTIONS:  Therapeutic exercises, Therapeutic activity, Neuromuscular re-education, Balance training, Gait training, Patient/Family education, Joint manipulation, Joint mobilization, Stair training, Aquatic Therapy, Dry Needling, Electrical stimulation, Spinal manipulation, Spinal mobilization, Cryotherapy, Moist heat, Taping, Traction, Ultrasound, Ionotophoresis 63m/ml Dexamethasone, Manual therapy, and Re-evaluation   PLAN FOR NEXT SESSION: Slowly progress LE strengthening exercises, balance and endurance for safety   KSigurd Sos PT 02/05/22 10:12 AM   [id number] [place] [id number] # [phone] [phone]

## 2022-02-11 ENCOUNTER — Ambulatory Visit: Payer: Medicare Other | Admitting: Rehabilitative and Restorative Service Providers"

## 2022-02-12 ENCOUNTER — Ambulatory Visit: Payer: Medicare Other | Admitting: Rehabilitative and Restorative Service Providers"

## 2022-02-16 ENCOUNTER — Ambulatory Visit: Payer: Medicare Other | Admitting: Physical Therapy

## 2022-02-18 ENCOUNTER — Ambulatory Visit: Payer: Medicare Other | Admitting: Rehabilitative and Restorative Service Providers"

## 2022-02-19 DIAGNOSIS — R350 Frequency of micturition: Secondary | ICD-10-CM | POA: Diagnosis not present

## 2022-02-25 ENCOUNTER — Ambulatory Visit: Payer: Medicare Other | Attending: Internal Medicine | Admitting: Rehabilitative and Restorative Service Providers"

## 2022-02-25 ENCOUNTER — Encounter: Payer: Self-pay | Admitting: Rehabilitative and Restorative Service Providers"

## 2022-02-25 DIAGNOSIS — R262 Difficulty in walking, not elsewhere classified: Secondary | ICD-10-CM

## 2022-02-25 DIAGNOSIS — M6281 Muscle weakness (generalized): Secondary | ICD-10-CM

## 2022-02-25 DIAGNOSIS — R2689 Other abnormalities of gait and mobility: Secondary | ICD-10-CM

## 2022-02-25 NOTE — Therapy (Signed)
OUTPATIENT PHYSICAL THERAPY TREATMENT NOTE   Patient Name: Sara Frye MRN: [id number] DOB:[birth year], 88 y.o., female Today's Date: 02/25/2022  PCP:  Deland Pretty, MD  REFERRING PROVIDER: Deland Pretty, MD   END OF SESSION:   PT End of Session - 02/25/22 1020     Visit Number 5    Date for PT Re-Evaluation 03/13/22    Authorization Type Medicare    Progress Note Due on Visit 10    PT Start Time 1016    PT Stop Time 1055    PT Time Calculation (min) 39 min    Activity Tolerance Patient limited by fatigue    Behavior During Therapy [facility] for tasks assessed/performed              Past Medical History:  Diagnosis Date   Arthritis    spine   Atrial fibrillation ([place])    Brain aneurysm 01/07/2017   Dysrhythmia    afib   H/O cerebral aneurysm repair ACA S/P Coiling by Dr. Estanislado Pandy 01/07/17 01/07/2017   Headache    with Eliquis   Hypertension    Hypothyroidism    Maternal blood transfusion    Neuromuscular disorder ([place])    spinal injury- "I'm unsure of walking, I'm very careful"   Shingles    Stroke ([place]) 11/2015   Past Surgical History:  Procedure Laterality Date   ABDOMINAL HYSTERECTOMY     ovaries removed   APPENDECTOMY     CARPAL TUNNEL RELEASE Bilateral    CATARACT EXTRACTION     CHOLECYSTECTOMY     IR ANGIO INTRA EXTRACRAN SEL COM CAROTID INNOMINATE BILAT MOD SED  12/30/2016   IR ANGIO INTRA EXTRACRAN SEL COM CAROTID INNOMINATE BILAT MOD SED  03/08/2018   IR ANGIO INTRA EXTRACRAN SEL INTERNAL CAROTID UNI R MOD SED  01/07/2017   IR ANGIO VERTEBRAL SEL SUBCLAVIAN INNOMINATE UNI R MOD SED  12/30/2016   IR ANGIO VERTEBRAL SEL VERTEBRAL BILAT MOD SED  03/08/2018   IR ANGIO VERTEBRAL SEL VERTEBRAL UNI L MOD SED  12/30/2016   IR ANGIOGRAM FOLLOW UP STUDY  01/07/2017   IR ANGIOGRAM FOLLOW UP STUDY  01/07/2017   IR ANGIOGRAM FOLLOW UP STUDY  01/07/2017   IR NEURO EACH ADD'L AFTER BASIC UNI RIGHT (MS)  01/07/2017   IR RADIOLOGIST EVAL & MGMT  11/24/2016    IR RADIOLOGIST EVAL & MGMT  01/25/2017   IR TRANSCATH/EMBOLIZ  01/07/2017   JOINT REPLACEMENT Bilateral    arthroscopic - knees   RADIOLOGY WITH ANESTHESIA N/A 12/30/2016   Procedure: RADIOLOGY WITH ANESTHESIA  EMBOLIZATION;  Surgeon: Luanne Bras, MD;  Location: [place];  Service: Radiology;  Laterality: N/A;   RADIOLOGY WITH ANESTHESIA N/A 01/07/2017   Procedure: EMBOLIZATION;  Surgeon: Luanne Bras, MD;  Location: [place];  Service: Radiology;  Laterality: N/A;   Patient Active Problem List   Diagnosis Date Noted   BPPV (benign paroxysmal positional vertigo) 04/16/2017   History of stroke 03/09/2017   H/O cerebral aneurysm repair ACA S/P Coiling by Dr. Estanislado Pandy 01/07/17 01/07/2017   Chronic anticoagulation 04/16/2016   Speech disturbance 12/17/2015   Atrial fibrillation ([place]) 12/17/2015   Hypertension 12/17/2015   Hyperlipidemia 12/17/2015   Dizziness 12/17/2015   Stroke (cerebrum) (HCC)     REFERRING DIAG: R29.898 (ICD-10-CM) - Leg weakness     THERAPY DIAG:  Other abnormalities of gait and mobility  Muscle weakness (generalized)  Difficulty in walking, not elsewhere classified  Rationale for Evaluation and Treatment Rehabilitation  PERTINENT HISTORY:  HTN, Paroxysmal Afib, bilat MCA and ACA multifocal punctate, Hx of cerebral aneurysm repair ACA s/p coiling on 01/07/17, OA  PRECAUTIONS:  Fall  SUBJECTIVE: Pt reports that she did some of her exercises this morning.   PAIN:  Are you having pain? No   OBJECTIVE: (objective measures completed at initial evaluation unless otherwise dated)  DIAGNOSTIC FINDINGS:   N/A   PATIENT SURVEYS:   02/25/2022: The Activities-specific Balance Confidence (ABC) Scale:   0%      10       20       30        40       50       60       70       80       90       100% No Confidence                                                                                      Completely Confident   How confident are you that you will not  lose your balance or become unsteady when you.   .walk around the house? 100% .walk up or down stairs?  40% .bend over and pick up a slipper from the front of a closet floor? 0% .reach for a small can off a shelf at eye level? 100% .stand on your tip toes and reach for something above your head? 30% .stand on a chair and reach for something? 0% .sweep the floor? 70% .walk outside the house to a car parked in the driveway? 20% .get into or out of a car? 80% .walk across a parking lot to the mall? 50% .walk up or down a ramp? 50% .walk in a crowded mall where people rapidly walk past you? 0% .are bumped into by people as you walk through the mall? 0% .step onto or off of an escalator while you are holding onto a railing? 20% .step onto or off an escalator while you are holding onto parcels such that you cannot hold onto the railing? 0% .walk outside on icy sidewalks? 0%   Total ABC score: 620   Scoring:  620/16=61.25% of self confidence    01/20/2022:  ABC scale 22% The Activities-specific Balance Confidence (ABC) Scale:   0%      10       20       30        40       50       60       70       80       90       100% No Confidence  Completely Confident   How confident are you that you will not lose your balance or become unsteady when you.   .walk around the house? 80% .walk up or down stairs?  20% .bend over and pick up a slipper from the front of a closet floor? 0% .reach for a small can off a shelf at eye level? 100% .stand on your tip toes and reach for something above your head? 0% .stand on a chair and reach for something? 0% .sweep the floor? 50% .walk outside the house to a car parked in the driveway? 41% .get into or out of a car? 50% .walk across a parking lot to the mall? 0% .walk up or down a ramp? 0% .walk in a crowded mall where people rapidly walk past you? 0% .are bumped into by  people as you walk through the mall? 0% .step onto or off of an escalator while you are holding onto a railing? 20% .step onto or off an escalator while you are holding onto parcels such that you cannot hold onto the railing? 0% .walk outside on icy sidewalks? 0%   Total ABC score: 350   Scoring:  350/16=22% of self confidence     COGNITION:           Overall cognitive status: Within functional limits for tasks assessed                          SENSATION: WFL   EDEMA:  Minimal edema noted in left LE, cardiologist aware   MUSCLE LENGTH: Hamstring tightness noted   POSTURE: rounded shoulders, forward head, and flexed trunk    LOWER EXTREMITY MMT: 01/20/2022:  RLE strength of 4/5, LLE strength of 4-/5   FUNCTIONAL TESTS:  02/25/2022:  3 min walk test:  159 ft without assistive device  01/20/2022: 5 times sit to stand: 30.99 sec with use of UE Timed up and go (TUG): 35.77 sec without use of AD with CGA   GAIT: Distance walked: 50 ft Assistive device utilized: None Level of assistance: CGA and Min A Comments: Attempted ambulation in clinic with RW and pt requires SBA with cuing for looking ahead instead of down at floor.       TODAY'S TREATMENT: 02/25/2022: Nustep Level 5 x4 minutes with PT present to discuss status Seated with 1# ankle weights:  LAQ, marching, hip abduction scissors.  BLE 2x10 Sit to/from stand x5 reps with UE support 3 min walk test:  159 ft without assistive device (pt with wide base of support with hands at a near 'high guard' position) Seated hip adduction ball squeeze 2x10  02/05/22:  Ball squeeze 15x 5" hold LAQ 1# added 2x10 Bil Seated marching 10x with 1# Standing heel raises: x10 Standing hip abduction 2x5 each Standing heel/toe raises x20 Standing marching 10x Gait for endurance: from ortho to cancer gym.  Sitting rest break x 2 minutes and returned to Ortho gym.   Sit to stand 5x from chair VC to use LE > arms  01/30/22: Nustep L1 3 min,  stopped due to LE fatigue. Ball squeeze 15x LAQ 1# added 10x Bil Seated marching 10x with 1# Standing heel lifts 10x Standing marching 10x Gait for endurance 85 feet with RW 2x with seated rest break in between.VC to reach back for arm rests of chair when sitting vs holding on to walker.  Sit to stand 5x from chair VC to use LE > arms  PATIENT EDUCATION:  Education details: Issued HEP Person educated: Patient and Child(ren) Education method: Explanation, Demonstration, and Handouts Education comprehension: verbalized understanding and returned demonstration     HOME EXERCISE PROGRAM: Access Code: [email or web address] URL: [email or web address] Date: 02/05/2022 Prepared by: Claiborne Billings  Exercises - Seated Long Arc Quad  - 1 x daily - 7 x weekly - 2 sets - 10 reps - Seated March  - 1 x daily - 7 x weekly - 2 sets - 10 reps - Seated Heel Raise  - 1 x daily - 7 x weekly - 2 sets - 10 reps - Seated Hip Adduction Isometrics with Ball  - 1 x daily - 7 x weekly - 2 sets - 10 reps - Heel Raises with Counter Support  - 1 x daily - 7 x weekly - 2 sets - 10 reps - Standing Hip Abduction with Counter Support  - 1 x daily - 7 x weekly - 2 sets - 5 reps  ASSESSMENT:   CLINICAL IMPRESSION: Ms Ehresman missed last week secondary to her birthday celebrations and having family visiting from out of town.  Pt has been compliant with HEP and reports improved confidence in her mobility.  Pt with increased score on ABC Confidence and has met that goal.  Pt was able to ambulate for 3 minutes without a seated recovery period, but did require encouragement to continue.  Pt continues to require skilled PT to progress towards goal related activities.  OBJECTIVE IMPAIRMENTS decreased balance, difficulty walking, decreased strength, postural dysfunction, and pain.    ACTIVITY LIMITATIONS lifting, bending, standing, and stairs   PARTICIPATION LIMITATIONS: cleaning, shopping, and community activity    PERSONAL FACTORS 3+ comorbidities: Hx of CVA, Hx of aneurysm repair, OA  are also affecting patient's functional outcome.    REHAB POTENTIAL: Good   CLINICAL DECISION MAKING: Evolving/moderate complexity   EVALUATION COMPLEXITY: Moderate     GOALS: Goals reviewed with patient? Yes   SHORT TERM GOALS: Target date: 02/10/2022    Pt will be independent with initial HEP. Baseline: Goal status: Met  2.  Pt to be able to demonstrate safe ambulation in clinic with use of RW. Baseline:  Goal status: Met     LONG TERM GOALS: Target date: 03/17/2022    Pt will be independent with advanced HEP. Baseline:  Goal status: Ongoing   2.  Pt able to negotiate ambulation up a ramp with LRAD and mod I to allow her to visit her daughter. Baseline:  Goal status: INITIAL   3.  Pt to increase ABC Confidence Scale to at least 50% to demonstrate decreased risk of falling. Baseline: 22% Goal status: Goal Met 02/25/2022 (62% confident)   4.  Pt will increase BLE strength to at least 4+/5 to allow pt to perform transfers with increased ease. Baseline:  Goal status: INITIAL              5.  Pt will improve TUG to less than 20 seconds with LRAD to decrease her risk of falling.                       Baseline: 36 sec without assistive device and CGA                       Goal status:  INITIAL     PLAN: PT FREQUENCY: 2x/week   PT DURATION: 8 weeks   PLANNED INTERVENTIONS: Therapeutic exercises, Therapeutic activity, Neuromuscular re-education, Balance training,  Gait training, Patient/Family education, Joint manipulation, Joint mobilization, Stair training, Aquatic Therapy, Dry Needling, Electrical stimulation, Spinal manipulation, Spinal mobilization, Cryotherapy, Moist heat, Taping, Traction, Ultrasound, Ionotophoresis 10m/ml Dexamethasone, Manual therapy, and Re-evaluation   PLAN FOR NEXT SESSION: Slowly progress LE strengthening exercises, balance and endurance for safety   SJuel Burrow  PT 02/25/22 10:59 AM   [facility] [address] [place] [place] [address]Phone # [phone] [phone]

## 2022-02-27 ENCOUNTER — Encounter: Payer: Self-pay | Admitting: Physical Therapy

## 2022-02-27 ENCOUNTER — Ambulatory Visit: Payer: Medicare Other | Admitting: Physical Therapy

## 2022-02-27 DIAGNOSIS — R262 Difficulty in walking, not elsewhere classified: Secondary | ICD-10-CM | POA: Diagnosis not present

## 2022-02-27 DIAGNOSIS — M6281 Muscle weakness (generalized): Secondary | ICD-10-CM | POA: Diagnosis not present

## 2022-02-27 DIAGNOSIS — R2689 Other abnormalities of gait and mobility: Secondary | ICD-10-CM

## 2022-02-27 NOTE — Therapy (Signed)
OUTPATIENT PHYSICAL THERAPY TREATMENT NOTE   Patient Name: Sara Frye MRN: [id number] DOB:1929/02/27, 88 y.o., female Today's Date: 02/27/2022  PCP:  Deland Pretty, MD  REFERRING PROVIDER: Deland Pretty, MD   END OF SESSION:   PT End of Session - 02/27/22 1023     Visit Number 6    Date for PT Re-Evaluation 03/13/22    Authorization Type Medicare    Progress Note Due on Visit 10    PT Start Time 1020    PT Stop Time 1058    PT Time Calculation (min) 38 min    Activity Tolerance Patient limited by fatigue;Patient tolerated treatment well    Behavior During Therapy [facility] for tasks assessed/performed               Past Medical History:  Diagnosis Date   Arthritis    spine   Atrial fibrillation ([place])    Brain aneurysm 01/07/2017   Dysrhythmia    afib   H/O cerebral aneurysm repair ACA S/P Coiling by Dr. Estanislado Pandy 01/07/17 01/07/2017   Headache    with Eliquis   Hypertension    Hypothyroidism    Maternal blood transfusion    Neuromuscular disorder ([place])    spinal injury- "I'm unsure of walking, I'm very careful"   Shingles    Stroke ([place]) 11/2015   Past Surgical History:  Procedure Laterality Date   ABDOMINAL HYSTERECTOMY     ovaries removed   APPENDECTOMY     CARPAL TUNNEL RELEASE Bilateral    CATARACT EXTRACTION     CHOLECYSTECTOMY     IR ANGIO INTRA EXTRACRAN SEL COM CAROTID INNOMINATE BILAT MOD SED  12/30/2016   IR ANGIO INTRA EXTRACRAN SEL COM CAROTID INNOMINATE BILAT MOD SED  03/08/2018   IR ANGIO INTRA EXTRACRAN SEL INTERNAL CAROTID UNI R MOD SED  01/07/2017   IR ANGIO VERTEBRAL SEL SUBCLAVIAN INNOMINATE UNI R MOD SED  12/30/2016   IR ANGIO VERTEBRAL SEL VERTEBRAL BILAT MOD SED  03/08/2018   IR ANGIO VERTEBRAL SEL VERTEBRAL UNI L MOD SED  12/30/2016   IR ANGIOGRAM FOLLOW UP STUDY  01/07/2017   IR ANGIOGRAM FOLLOW UP STUDY  01/07/2017   IR ANGIOGRAM FOLLOW UP STUDY  01/07/2017   IR NEURO EACH ADD'L AFTER BASIC UNI RIGHT (MS)  01/07/2017   IR  RADIOLOGIST EVAL & MGMT  11/24/2016   IR RADIOLOGIST EVAL & MGMT  01/25/2017   IR TRANSCATH/EMBOLIZ  01/07/2017   JOINT REPLACEMENT Bilateral    arthroscopic - knees   RADIOLOGY WITH ANESTHESIA N/A 12/30/2016   Procedure: RADIOLOGY WITH ANESTHESIA  EMBOLIZATION;  Surgeon: Luanne Bras, MD;  Location: [place];  Service: Radiology;  Laterality: N/A;   RADIOLOGY WITH ANESTHESIA N/A 01/07/2017   Procedure: EMBOLIZATION;  Surgeon: Luanne Bras, MD;  Location: [place];  Service: Radiology;  Laterality: N/A;   Patient Active Problem List   Diagnosis Date Noted   BPPV (benign paroxysmal positional vertigo) 04/16/2017   History of stroke 03/09/2017   H/O cerebral aneurysm repair ACA S/P Coiling by Dr. Estanislado Pandy 01/07/17 01/07/2017   Chronic anticoagulation 04/16/2016   Speech disturbance 12/17/2015   Atrial fibrillation ([place]) 12/17/2015   Hypertension 12/17/2015   Hyperlipidemia 12/17/2015   Dizziness 12/17/2015   Stroke (cerebrum) (HCC)     REFERRING DIAG: R29.898 (ICD-10-CM) - Leg weakness     THERAPY DIAG:  Other abnormalities of gait and mobility  Muscle weakness (generalized)  Difficulty in walking, not elsewhere classified  Rationale for Evaluation and Treatment  Rehabilitation  PERTINENT HISTORY: HTN, Paroxysmal Afib, bilat MCA and ACA multifocal punctate, Hx of cerebral aneurysm repair ACA s/p coiling on 01/07/17, OA  PRECAUTIONS:  Fall  SUBJECTIVE: I am doing well. Not doing exercises this AM was very helpful.     PAIN:  Are you having pain? No   OBJECTIVE: (objective measures completed at initial evaluation unless otherwise dated)  DIAGNOSTIC FINDINGS:   N/A   PATIENT SURVEYS:   02/25/2022: The Activities-specific Balance Confidence (ABC) Scale:   0%      10       20       30        40       50       60       70       80       90       100% No Confidence                                                                                      Completely  Confident   How confident are you that you will not lose your balance or become unsteady when you.   .walk around the house? 100% .walk up or down stairs?  40% .bend over and pick up a slipper from the front of a closet floor? 0% .reach for a small can off a shelf at eye level? 100% .stand on your tip toes and reach for something above your head? 30% .stand on a chair and reach for something? 0% .sweep the floor? 70% .walk outside the house to a car parked in the driveway? 32% .get into or out of a car? 80% .walk across a parking lot to the mall? 50% .walk up or down a ramp? 50% .walk in a crowded mall where people rapidly walk past you? 0% .are bumped into by people as you walk through the mall? 0% .step onto or off of an escalator while you are holding onto a railing? 20% .step onto or off an escalator while you are holding onto parcels such that you cannot hold onto the railing? 0% .walk outside on icy sidewalks? 0%   Total ABC score: 620   Scoring:  620/16=61.25% of self confidence    01/20/2022:  ABC scale 22% The Activities-specific Balance Confidence (ABC) Scale:   0%      10       20       30        40       50       60       70       80       90       100% No Confidence  Completely Confident   How confident are you that you will not lose your balance or become unsteady when you.   .walk around the house? 80% .walk up or down stairs?  20% .bend over and pick up a slipper from the front of a closet floor? 0% .reach for a small can off a shelf at eye level? 100% .stand on your tip toes and reach for something above your head? 0% .stand on a chair and reach for something? 0% .sweep the floor? 50% .walk outside the house to a car parked in the driveway? 52% .get into or out of a car? 50% .walk across a parking lot to the mall? 0% .walk up or down a ramp? 0% .walk in a crowded mall where  people rapidly walk past you? 0% .are bumped into by people as you walk through the mall? 0% .step onto or off of an escalator while you are holding onto a railing? 20% .step onto or off an escalator while you are holding onto parcels such that you cannot hold onto the railing? 0% .walk outside on icy sidewalks? 0%   Total ABC score: 350   Scoring:  350/16=22% of self confidence     COGNITION:           Overall cognitive status: Within functional limits for tasks assessed                          SENSATION: WFL   EDEMA:  Minimal edema noted in left LE, cardiologist aware   MUSCLE LENGTH: Hamstring tightness noted   POSTURE: rounded shoulders, forward head, and flexed trunk    LOWER EXTREMITY MMT: 01/20/2022:  RLE strength of 4/5, LLE strength of 4-/5   FUNCTIONAL TESTS:  02/25/2022:  3 min walk test:  159 ft without assistive device  01/20/2022: 5 times sit to stand: 30.99 sec with use of UE Timed up and go (TUG): 35.77 sec without use of AD with CGA   GAIT: Distance walked: 50 ft Assistive device utilized: None Level of assistance: CGA and Min A Comments: Attempted ambulation in clinic with RW and pt requires SBA with cuing for looking ahead instead of down at floor.       TODAY'S TREATMENT:   02/27/22: Nustep Level 5 x4 minutes with PTA present to discuss status Seated with 1.5# ankle weights:  LAQ, marching, hip abduction scissors.  BLE 2x10 Sit to/from stand x5 reps with UE support Ambulation: 145 feet 2x with sitting rest break in between.Pt used SPC Seated hip adduction ball squeeze 2x10   02/25/2022: Nustep Level 5 x4 minutes with PT present to discuss status Seated with 1# ankle weights:  LAQ, marching, hip abduction scissors.  BLE 2x10 Sit to/from stand x5 reps with UE support 3 min walk test:  159 ft without assistive device (pt with wide base of support with hands at a near 'high guard' position) Seated hip adduction ball squeeze 2x10  02/05/22:  Ball  squeeze 15x 5" hold LAQ 1# added 2x10 Bil Seated marching 10x with 1# Standing heel raises: x10 Standing hip abduction 2x5 each Standing heel/toe raises x20 Standing marching 10x Gait for endurance: from ortho to cancer gym.  Sitting rest break x 2 minutes and returned to Ortho gym.   Sit to stand 5x from chair VC to use LE > arms   PATIENT EDUCATION:  Education details: Issued HEP Person educated: Patient and Child(ren) Education method: Explanation, Media planner, and Handouts Education  comprehension: verbalized understanding and returned demonstration     HOME EXERCISE PROGRAM: Access Code: [email or web address] URL: [email or web address] Date: 02/05/2022 Prepared by: Claiborne Billings  Exercises - Seated Long Arc Quad  - 1 x daily - 7 x weekly - 2 sets - 10 reps - Seated March  - 1 x daily - 7 x weekly - 2 sets - 10 reps - Seated Heel Raise  - 1 x daily - 7 x weekly - 2 sets - 10 reps - Seated Hip Adduction Isometrics with Ball  - 1 x daily - 7 x weekly - 2 sets - 10 reps - Heel Raises with Counter Support  - 1 x daily - 7 x weekly - 2 sets - 10 reps - Standing Hip Abduction with Counter Support  - 1 x daily - 7 x weekly - 2 sets - 5 reps  ASSESSMENT:   CLINICAL IMPRESSION:  Pt arrives to PT today pain free. She reports not doing her HEP before coming to PT was very helpful. Pt became fatigued ambulating with cane but sequencing was good.   OBJECTIVE IMPAIRMENTS decreased balance, difficulty walking, decreased strength, postural dysfunction, and pain.    ACTIVITY LIMITATIONS lifting, bending, standing, and stairs   PARTICIPATION LIMITATIONS: cleaning, shopping, and community activity   PERSONAL FACTORS 3+ comorbidities: Hx of CVA, Hx of aneurysm repair, OA  are also affecting patient's functional outcome.    REHAB POTENTIAL: Good   CLINICAL DECISION MAKING: Evolving/moderate complexity   EVALUATION COMPLEXITY: Moderate     GOALS: Goals reviewed with patient? Yes   SHORT  TERM GOALS: Target date: 02/10/2022    Pt will be independent with initial HEP. Baseline: Goal status: Met  2.  Pt to be able to demonstrate safe ambulation in clinic with use of RW. Baseline:  Goal status: Met     LONG TERM GOALS: Target date: 03/17/2022    Pt will be independent with advanced HEP. Baseline:  Goal status: Ongoing   2.  Pt able to negotiate ambulation up a ramp with LRAD and mod I to allow her to visit her daughter. Baseline:  Goal status: INITIAL   3.  Pt to increase ABC Confidence Scale to at least 50% to demonstrate decreased risk of falling. Baseline: 22% Goal status: Goal Met 02/25/2022 (62% confident)   4.  Pt will increase BLE strength to at least 4+/5 to allow pt to perform transfers with increased ease. Baseline:  Goal status: INITIAL              5.  Pt will improve TUG to less than 20 seconds with LRAD to decrease her risk of falling.                       Baseline: 36 sec without assistive device and CGA                       Goal status:  INITIAL     PLAN: PT FREQUENCY: 2x/week   PT DURATION: 8 weeks   PLANNED INTERVENTIONS: Therapeutic exercises, Therapeutic activity, Neuromuscular re-education, Balance training, Gait training, Patient/Family education, Joint manipulation, Joint mobilization, Stair training, Aquatic Therapy, Dry Needling, Electrical stimulation, Spinal manipulation, Spinal mobilization, Cryotherapy, Moist heat, Taping, Traction, Ultrasound, Ionotophoresis 51m/ml Dexamethasone, Manual therapy, and Re-evaluation   PLAN FOR NEXT SESSION: Slowly progress LE strengthening exercises, balance and endurance for safety   JMyrene Galas PTA 02/27/22 10:55 AM   [facility] [address]  [address] [place], [place] [address] Phone # [phone] Fax [phone]

## 2022-03-04 ENCOUNTER — Ambulatory Visit: Payer: Medicare Other | Admitting: Rehabilitative and Restorative Service Providers"

## 2022-03-04 ENCOUNTER — Encounter: Payer: Self-pay | Admitting: Rehabilitative and Restorative Service Providers"

## 2022-03-04 DIAGNOSIS — R2689 Other abnormalities of gait and mobility: Secondary | ICD-10-CM | POA: Diagnosis not present

## 2022-03-04 DIAGNOSIS — R262 Difficulty in walking, not elsewhere classified: Secondary | ICD-10-CM

## 2022-03-04 DIAGNOSIS — M6281 Muscle weakness (generalized): Secondary | ICD-10-CM | POA: Diagnosis not present

## 2022-03-04 NOTE — Therapy (Signed)
OUTPATIENT PHYSICAL THERAPY TREATMENT NOTE   Patient Name: Sara Frye MRN: [id number] DOB:[birth year], 88 y.o., female Today's Date: 03/04/2022  PCP:  Deland Pretty, MD  REFERRING PROVIDER: Deland Pretty, MD   END OF SESSION:   PT End of Session - 03/04/22 1012     Visit Number 7    Date for PT Re-Evaluation 03/13/22    Authorization Type Medicare    Progress Note Due on Visit 10    PT Start Time 1010    PT Stop Time 1050    PT Time Calculation (min) 40 min    Activity Tolerance Patient limited by fatigue;Patient tolerated treatment well    Behavior During Therapy [facility] for tasks assessed/performed               Past Medical History:  Diagnosis Date   Arthritis    spine   Atrial fibrillation ([place])    Brain aneurysm 01/07/2017   Dysrhythmia    afib   H/O cerebral aneurysm repair ACA S/P Coiling by Dr. Estanislado Pandy 01/07/17 01/07/2017   Headache    with Eliquis   Hypertension    Hypothyroidism    Maternal blood transfusion    Neuromuscular disorder ([place])    spinal injury- "I'm unsure of walking, I'm very careful"   Shingles    Stroke ([place]) 11/2015   Past Surgical History:  Procedure Laterality Date   ABDOMINAL HYSTERECTOMY     ovaries removed   APPENDECTOMY     CARPAL TUNNEL RELEASE Bilateral    CATARACT EXTRACTION     CHOLECYSTECTOMY     IR ANGIO INTRA EXTRACRAN SEL COM CAROTID INNOMINATE BILAT MOD SED  12/30/2016   IR ANGIO INTRA EXTRACRAN SEL COM CAROTID INNOMINATE BILAT MOD SED  03/08/2018   IR ANGIO INTRA EXTRACRAN SEL INTERNAL CAROTID UNI R MOD SED  01/07/2017   IR ANGIO VERTEBRAL SEL SUBCLAVIAN INNOMINATE UNI R MOD SED  12/30/2016   IR ANGIO VERTEBRAL SEL VERTEBRAL BILAT MOD SED  03/08/2018   IR ANGIO VERTEBRAL SEL VERTEBRAL UNI L MOD SED  12/30/2016   IR ANGIOGRAM FOLLOW UP STUDY  01/07/2017   IR ANGIOGRAM FOLLOW UP STUDY  01/07/2017   IR ANGIOGRAM FOLLOW UP STUDY  01/07/2017   IR NEURO EACH ADD'L AFTER BASIC UNI RIGHT (MS)  01/07/2017   IR  RADIOLOGIST EVAL & MGMT  11/24/2016   IR RADIOLOGIST EVAL & MGMT  01/25/2017   IR TRANSCATH/EMBOLIZ  01/07/2017   JOINT REPLACEMENT Bilateral    arthroscopic - knees   RADIOLOGY WITH ANESTHESIA N/A 12/30/2016   Procedure: RADIOLOGY WITH ANESTHESIA  EMBOLIZATION;  Surgeon: Luanne Bras, MD;  Location: [place];  Service: Radiology;  Laterality: N/A;   RADIOLOGY WITH ANESTHESIA N/A 01/07/2017   Procedure: EMBOLIZATION;  Surgeon: Luanne Bras, MD;  Location: [place];  Service: Radiology;  Laterality: N/A;   Patient Active Problem List   Diagnosis Date Noted   BPPV (benign paroxysmal positional vertigo) 04/16/2017   History of stroke 03/09/2017   H/O cerebral aneurysm repair ACA S/P Coiling by Dr. Estanislado Pandy 01/07/17 01/07/2017   Chronic anticoagulation 04/16/2016   Speech disturbance 12/17/2015   Atrial fibrillation ([place]) 12/17/2015   Hypertension 12/17/2015   Hyperlipidemia 12/17/2015   Dizziness 12/17/2015   Stroke (cerebrum) (HCC)     REFERRING DIAG: R29.898 (ICD-10-CM) - Leg weakness     THERAPY DIAG:  Other abnormalities of gait and mobility  Muscle weakness (generalized)  Difficulty in walking, not elsewhere classified  Rationale for Evaluation and Treatment  Rehabilitation  PERTINENT HISTORY: HTN, Paroxysmal Afib, bilat MCA and ACA multifocal punctate, Hx of cerebral aneurysm repair ACA s/p coiling on 01/07/17, OA  PRECAUTIONS:  Fall  SUBJECTIVE: "I just got my new walker"   PAIN:  Are you having pain? No   OBJECTIVE: (objective measures completed at initial evaluation unless otherwise dated)  DIAGNOSTIC FINDINGS:   N/A   PATIENT SURVEYS:   02/25/2022: The Activities-specific Balance Confidence (ABC) Scale:   0%      10       20       30        40       50       60       70       80       90       100% No Confidence                                                                                      Completely Confident   How confident are you that you  will not lose your balance or become unsteady when you.   .walk around the house? 100% .walk up or down stairs?  40% .bend over and pick up a slipper from the front of a closet floor? 0% .reach for a small can off a shelf at eye level? 100% .stand on your tip toes and reach for something above your head? 30% .stand on a chair and reach for something? 0% .sweep the floor? 70% .walk outside the house to a car parked in the driveway? 54% .get into or out of a car? 80% .walk across a parking lot to the mall? 50% .walk up or down a ramp? 50% .walk in a crowded mall where people rapidly walk past you? 0% .are bumped into by people as you walk through the mall? 0% .step onto or off of an escalator while you are holding onto a railing? 20% .step onto or off an escalator while you are holding onto parcels such that you cannot hold onto the railing? 0% .walk outside on icy sidewalks? 0%   Total ABC score: 620   Scoring:  620/16=61.25% of self confidence    01/20/2022:  ABC scale 22% The Activities-specific Balance Confidence (ABC) Scale:   0%      10       20       30        40       50       60       70       80       90       100% No Confidence  Completely Confident   How confident are you that you will not lose your balance or become unsteady when you.   .walk around the house? 80% .walk up or down stairs?  20% .bend over and pick up a slipper from the front of a closet floor? 0% .reach for a small can off a shelf at eye level? 100% .stand on your tip toes and reach for something above your head? 0% .stand on a chair and reach for something? 0% .sweep the floor? 50% .walk outside the house to a car parked in the driveway? 01% .get into or out of a car? 50% .walk across a parking lot to the mall? 0% .walk up or down a ramp? 0% .walk in a crowded mall where people rapidly walk past you? 0% .are  bumped into by people as you walk through the mall? 0% .step onto or off of an escalator while you are holding onto a railing? 20% .step onto or off an escalator while you are holding onto parcels such that you cannot hold onto the railing? 0% .walk outside on icy sidewalks? 0%   Total ABC score: 350   Scoring:  350/16=22% of self confidence     COGNITION:           Overall cognitive status: Within functional limits for tasks assessed                          SENSATION: WFL   EDEMA:  Minimal edema noted in left LE, cardiologist aware   MUSCLE LENGTH: Hamstring tightness noted   POSTURE: rounded shoulders, forward head, and flexed trunk    LOWER EXTREMITY MMT: 01/20/2022:  RLE strength of 4/5, LLE strength of 4-/5   FUNCTIONAL TESTS:  02/25/2022:  3 min walk test:  159 ft without assistive device  01/20/2022: 5 times sit to stand: 30.99 sec with use of UE Timed up and go (TUG): 35.77 sec without use of AD with CGA   GAIT: Distance walked: 50 ft Assistive device utilized: None Level of assistance: CGA and Min A Comments: Attempted ambulation in clinic with RW and pt requires SBA with cuing for looking ahead instead of down at floor.       TODAY'S TREATMENT:   03/04/2022: Nustep Level 5 x5 minutes with PT present to discuss status Practiced using new 4WRW and educated about brakes and how to safely utilize during transfers 3 min walk test:  245 ft with 4WRW Seated with 1.5# ankle weights:  LAQ, marching, hip abduction scissors.  BLE 2x10 Ambulation outside with 4WRW to negotiate curbs and curb cutouts with SBA and cuing  02/27/22: Nustep Level 5 x4 minutes with PTA present to discuss status Seated with 1.5# ankle weights:  LAQ, marching, hip abduction scissors.  BLE 2x10 Sit to/from stand x5 reps with UE support Ambulation: 145 feet 2x with sitting rest break in between.Pt used SPC Seated hip adduction ball squeeze 2x10   02/25/2022: Nustep Level 5 x4 minutes with PT  present to discuss status Seated with 1# ankle weights:  LAQ, marching, hip abduction scissors.  BLE 2x10 Sit to/from stand x5 reps with UE support 3 min walk test:  159 ft without assistive device (pt with wide base of support with hands at a near 'high guard' position) Seated hip adduction ball squeeze 2x10    PATIENT EDUCATION:  Education details: Issued HEP Person educated: Patient and Child(ren) Education method: Explanation, Demonstration, and Handouts Education comprehension: verbalized  understanding and returned demonstration     HOME EXERCISE PROGRAM: Access Code: [email or web address] URL: [email or web address] Date: 02/05/2022 Prepared by: Claiborne Billings  Exercises - Seated Long Arc Quad  - 1 x daily - 7 x weekly - 2 sets - 10 reps - Seated March  - 1 x daily - 7 x weekly - 2 sets - 10 reps - Seated Heel Raise  - 1 x daily - 7 x weekly - 2 sets - 10 reps - Seated Hip Adduction Isometrics with Ball  - 1 x daily - 7 x weekly - 2 sets - 10 reps - Heel Raises with Counter Support  - 1 x daily - 7 x weekly - 2 sets - 10 reps - Standing Hip Abduction with Counter Support  - 1 x daily - 7 x weekly - 2 sets - 5 reps  ASSESSMENT:   CLINICAL IMPRESSION:  Ms Kivi presents to PT today with her new 4WRW that she received yesterday.  Pt and daughter received extensive education on safety features, such as use of brakes.  Pt requires max cuing initially, then min cuing towards end of session as pt was more familiarized with routine.  Pt continues to require skilled PT to progress towards goal related activities.  OBJECTIVE IMPAIRMENTS decreased balance, difficulty walking, decreased strength, postural dysfunction, and pain.    ACTIVITY LIMITATIONS lifting, bending, standing, and stairs   PARTICIPATION LIMITATIONS: cleaning, shopping, and community activity   PERSONAL FACTORS 3+ comorbidities: Hx of CVA, Hx of aneurysm repair, OA  are also affecting patient's functional outcome.     REHAB POTENTIAL: Good   CLINICAL DECISION MAKING: Evolving/moderate complexity   EVALUATION COMPLEXITY: Moderate     GOALS: Goals reviewed with patient? Yes   SHORT TERM GOALS: Target date: 02/10/2022    Pt will be independent with initial HEP. Baseline: Goal status: Met  2.  Pt to be able to demonstrate safe ambulation in clinic with use of RW. Baseline:  Goal status: Met     LONG TERM GOALS: Target date: 03/17/2022    Pt will be independent with advanced HEP. Baseline:  Goal status: Ongoing   2.  Pt able to negotiate ambulation up a ramp with LRAD and mod I to allow her to visit her daughter. Baseline:  Goal status: Ongoing   3.  Pt to increase ABC Confidence Scale to at least 50% to demonstrate decreased risk of falling. Baseline: 22% Goal status: Goal Met 02/25/2022 (62% confident)   4.  Pt will increase BLE strength to at least 4+/5 to allow pt to perform transfers with increased ease. Baseline:  Goal status: INITIAL              5.  Pt will improve TUG to less than 20 seconds with LRAD to decrease her risk of falling.                       Baseline: 36 sec without assistive device and CGA                       Goal status:  INITIAL     PLAN: PT FREQUENCY: 2x/week   PT DURATION: 8 weeks   PLANNED INTERVENTIONS: Therapeutic exercises, Therapeutic activity, Neuromuscular re-education, Balance training, Gait training, Patient/Family education, Joint manipulation, Joint mobilization, Stair training, Aquatic Therapy, Dry Needling, Electrical stimulation, Spinal manipulation, Spinal mobilization, Cryotherapy, Moist heat, Taping, Traction, Ultrasound, Ionotophoresis 58m/ml Dexamethasone, Manual therapy, and Re-evaluation   PLAN  FOR NEXT SESSION: Slowly progress LE strengthening exercises, balance and endurance for safety   Juel Burrow, PT 03/04/22 12:00 PM   [facility] [address] [place], [place] [address] Phone #  [phone] Fax [phone]

## 2022-03-06 ENCOUNTER — Ambulatory Visit: Payer: Medicare Other | Admitting: Physical Therapy

## 2022-03-06 ENCOUNTER — Encounter: Payer: Self-pay | Admitting: Physical Therapy

## 2022-03-06 DIAGNOSIS — R2689 Other abnormalities of gait and mobility: Secondary | ICD-10-CM

## 2022-03-06 DIAGNOSIS — M6281 Muscle weakness (generalized): Secondary | ICD-10-CM | POA: Diagnosis not present

## 2022-03-06 DIAGNOSIS — R262 Difficulty in walking, not elsewhere classified: Secondary | ICD-10-CM

## 2022-03-06 NOTE — Therapy (Signed)
OUTPATIENT PHYSICAL THERAPY TREATMENT NOTE   Patient Name: Sara Frye MRN: [id number] DOB:Aug 29, 1928, 88 y.o., female Today's Date: 03/06/2022  PCP:  Deland Pretty, MD  REFERRING PROVIDER: Deland Pretty, MD   END OF SESSION:   PT End of Session - 03/06/22 1024     Visit Number 8    Date for PT Re-Evaluation 03/13/22    Authorization Type Medicare    Progress Note Due on Visit 10    PT Start Time 1020    PT Stop Time 1100    PT Time Calculation (min) 40 min    Activity Tolerance Patient tolerated treatment well    Behavior During Therapy WFL for tasks assessed/performed                Past Medical History:  Diagnosis Date   Arthritis    spine   Atrial fibrillation ([place])    Brain aneurysm 01/07/2017   Dysrhythmia    afib   H/O cerebral aneurysm repair ACA S/P Coiling by Dr. Estanislado Pandy 01/07/17 01/07/2017   Headache    with Eliquis   Hypertension    Hypothyroidism    Maternal blood transfusion    Neuromuscular disorder ([place])    spinal injury- "I'm unsure of walking, I'm very careful"   Shingles    Stroke ([place]) 11/2015   Past Surgical History:  Procedure Laterality Date   ABDOMINAL HYSTERECTOMY     ovaries removed   APPENDECTOMY     CARPAL TUNNEL RELEASE Bilateral    CATARACT EXTRACTION     CHOLECYSTECTOMY     IR ANGIO INTRA EXTRACRAN SEL COM CAROTID INNOMINATE BILAT MOD SED  12/30/2016   IR ANGIO INTRA EXTRACRAN SEL COM CAROTID INNOMINATE BILAT MOD SED  03/08/2018   IR ANGIO INTRA EXTRACRAN SEL INTERNAL CAROTID UNI R MOD SED  01/07/2017   IR ANGIO VERTEBRAL SEL SUBCLAVIAN INNOMINATE UNI R MOD SED  12/30/2016   IR ANGIO VERTEBRAL SEL VERTEBRAL BILAT MOD SED  03/08/2018   IR ANGIO VERTEBRAL SEL VERTEBRAL UNI L MOD SED  12/30/2016   IR ANGIOGRAM FOLLOW UP STUDY  01/07/2017   IR ANGIOGRAM FOLLOW UP STUDY  01/07/2017   IR ANGIOGRAM FOLLOW UP STUDY  01/07/2017   IR NEURO EACH ADD'L AFTER BASIC UNI RIGHT (MS)  01/07/2017   IR RADIOLOGIST EVAL & MGMT   11/24/2016   IR RADIOLOGIST EVAL & MGMT  01/25/2017   IR TRANSCATH/EMBOLIZ  01/07/2017   JOINT REPLACEMENT Bilateral    arthroscopic - knees   RADIOLOGY WITH ANESTHESIA N/A 12/30/2016   Procedure: RADIOLOGY WITH ANESTHESIA  EMBOLIZATION;  Surgeon: Luanne Bras, MD;  Location: [place];  Service: Radiology;  Laterality: N/A;   RADIOLOGY WITH ANESTHESIA N/A 01/07/2017   Procedure: EMBOLIZATION;  Surgeon: Luanne Bras, MD;  Location: [place];  Service: Radiology;  Laterality: N/A;   Patient Active Problem List   Diagnosis Date Noted   BPPV (benign paroxysmal positional vertigo) 04/16/2017   History of stroke 03/09/2017   H/O cerebral aneurysm repair ACA S/P Coiling by Dr. Estanislado Pandy 01/07/17 01/07/2017   Chronic anticoagulation 04/16/2016   Speech disturbance 12/17/2015   Atrial fibrillation ([place]) 12/17/2015   Hypertension 12/17/2015   Hyperlipidemia 12/17/2015   Dizziness 12/17/2015   Stroke (cerebrum) (HCC)     REFERRING DIAG: R29.898 (ICD-10-CM) - Leg weakness     THERAPY DIAG:  Other abnormalities of gait and mobility  Muscle weakness (generalized)  Difficulty in walking, not elsewhere classified  Rationale for Evaluation and Treatment Rehabilitation  PERTINENT HISTORY: HTN, Paroxysmal Afib, bilat MCA and ACA multifocal punctate, Hx of cerebral aneurysm repair ACA s/p coiling on 01/07/17, OA  PRECAUTIONS:  Fall  SUBJECTIVE: Using walker 100% PAIN:  Are you having pain? No   OBJECTIVE: (objective measures completed at initial evaluation unless otherwise dated)  DIAGNOSTIC FINDINGS:   N/A   PATIENT SURVEYS:   02/25/2022: The Activities-specific Balance Confidence (ABC) Scale:   0%      10       20       30        40       50       60       70       80       90       100% No Confidence                                                                                      Completely Confident   How confident are you that you will not lose your balance or become  unsteady when you.   .walk around the house? 100% .walk up or down stairs?  40% .bend over and pick up a slipper from the front of a closet floor? 0% .reach for a small can off a shelf at eye level? 100% .stand on your tip toes and reach for something above your head? 30% .stand on a chair and reach for something? 0% .sweep the floor? 70% .walk outside the house to a car parked in the driveway? 72% .get into or out of a car? 80% .walk across a parking lot to the mall? 50% .walk up or down a ramp? 50% .walk in a crowded mall where people rapidly walk past you? 0% .are bumped into by people as you walk through the mall? 0% .step onto or off of an escalator while you are holding onto a railing? 20% .step onto or off an escalator while you are holding onto parcels such that you cannot hold onto the railing? 0% .walk outside on icy sidewalks? 0%   Total ABC score: 620   Scoring:  620/16=61.25% of self confidence    01/20/2022:  ABC scale 22% The Activities-specific Balance Confidence (ABC) Scale:   0%      10       20       30        40       50       60       70       80       90       100% No Confidence  Completely Confident   How confident are you that you will not lose your balance or become unsteady when you.   .walk around the house? 80% .walk up or down stairs?  20% .bend over and pick up a slipper from the front of a closet floor? 0% .reach for a small can off a shelf at eye level? 100% .stand on your tip toes and reach for something above your head? 0% .stand on a chair and reach for something? 0% .sweep the floor? 50% .walk outside the house to a car parked in the driveway? 78% .get into or out of a car? 50% .walk across a parking lot to the mall? 0% .walk up or down a ramp? 0% .walk in a crowded mall where people rapidly walk past you? 0% .are bumped into by people as you walk through  the mall? 0% .step onto or off of an escalator while you are holding onto a railing? 20% .step onto or off an escalator while you are holding onto parcels such that you cannot hold onto the railing? 0% .walk outside on icy sidewalks? 0%   Total ABC score: 350   Scoring:  350/16=22% of self confidence     COGNITION:           Overall cognitive status: Within functional limits for tasks assessed                          SENSATION: WFL   EDEMA:  Minimal edema noted in left LE, cardiologist aware   MUSCLE LENGTH: Hamstring tightness noted   POSTURE: rounded shoulders, forward head, and flexed trunk    LOWER EXTREMITY MMT: 01/20/2022:  RLE strength of 4/5, LLE strength of 4-/5   FUNCTIONAL TESTS:  02/25/2022:  3 min walk test:  159 ft without assistive device  01/20/2022: 5 times sit to stand: 30.99 sec with use of UE Timed up and go (TUG): 35.77 sec without use of AD with CGA   GAIT: Distance walked: 50 ft Assistive device utilized: None Level of assistance: CGA and Min A Comments: Attempted ambulation in clinic with RW and pt requires SBA with cuing for looking ahead instead of down at floor.       TODAY'S TREATMENT:   03/06/22: Nustep L2 ( older model ) 5 min Seated with 2# ankle weights:  LAQ, marching,  BLE 2x10 Yellow loop clamshells 2x10  Ambulation with Rollator entire building 1x then rest 1 min, repeat 1x more    03/04/2022: Nustep Level 5 x5 minutes with PT present to discuss status Practiced using new 4WRW and educated about brakes and how to safely utilize during transfers 3 min walk test:  245 ft with 4WRW Seated with 1.5# ankle weights:  LAQ, marching, hip abduction scissors.  BLE 2x10 Ambulation outside with 4WRW to negotiate curbs and curb cutouts with SBA and cuing  02/27/22: Nustep Level 5 x4 minutes with PTA present to discuss status Seated with 1.5# ankle weights:  LAQ, marching, hip abduction scissors.  BLE 2x10 Sit to/from stand x5 reps with UE  support Ambulation: 145 feet 2x with sitting rest break in between.Pt used SPC Seated hip adduction ball squeeze 2x10    PATIENT EDUCATION:  Education details: Issued HEP Person educated: Patient and Child(ren) Education method: Explanation, Demonstration, and Handouts Education comprehension: verbalized understanding and returned demonstration     HOME EXERCISE PROGRAM: Access Code: [email or web address] URL: [email or web address] Date: 02/05/2022 Prepared by: Claiborne Billings  Exercises - Seated  Long Arc Quad  - 1 x daily - 7 x weekly - 2 sets - 10 reps - Seated March  - 1 x daily - 7 x weekly - 2 sets - 10 reps - Seated Heel Raise  - 1 x daily - 7 x weekly - 2 sets - 10 reps - Seated Hip Adduction Isometrics with Ball  - 1 x daily - 7 x weekly - 2 sets - 10 reps - Heel Raises with Counter Support  - 1 x daily - 7 x weekly - 2 sets - 10 reps - Standing Hip Abduction with Counter Support  - 1 x daily - 7 x weekly - 2 sets - 5 reps  ASSESSMENT:   CLINICAL IMPRESSION:  Pt arrives today pain free and using her Rollator. She is using her Rollator mainly for community ambulation and not in the home. Pt did not do her exercises this AM so she would not be too tired for todays exercises. Increased weights to 2#  today for increased LE strength.Some fatigue with increased weight.   OBJECTIVE IMPAIRMENTS decreased balance, difficulty walking, decreased strength, postural dysfunction, and pain.    ACTIVITY LIMITATIONS lifting, bending, standing, and stairs   PARTICIPATION LIMITATIONS: cleaning, shopping, and community activity   PERSONAL FACTORS 3+ comorbidities: Hx of CVA, Hx of aneurysm repair, OA  are also affecting patient's functional outcome.    REHAB POTENTIAL: Good   CLINICAL DECISION MAKING: Evolving/moderate complexity   EVALUATION COMPLEXITY: Moderate     GOALS: Goals reviewed with patient? Yes   SHORT TERM GOALS: Target date: 02/10/2022    Pt will be independent with  initial HEP. Baseline: Goal status: Met  2.  Pt to be able to demonstrate safe ambulation in clinic with use of RW. Baseline:  Goal status: Met     LONG TERM GOALS: Target date: 03/17/2022    Pt will be independent with advanced HEP. Baseline:  Goal status: Ongoing   2.  Pt able to negotiate ambulation up a ramp with LRAD and mod I to allow her to visit her daughter. Baseline:  Goal status: Ongoing   3.  Pt to increase ABC Confidence Scale to at least 50% to demonstrate decreased risk of falling. Baseline: 22% Goal status: Goal Met 02/25/2022 (62% confident)   4.  Pt will increase BLE strength to at least 4+/5 to allow pt to perform transfers with increased ease. Baseline:  Goal status: INITIAL              5.  Pt will improve TUG to less than 20 seconds with LRAD to decrease her risk of falling.                       Baseline: 36 sec without assistive device and CGA                       Goal status:  INITIAL     PLAN: PT FREQUENCY: 2x/week   PT DURATION: 8 weeks   PLANNED INTERVENTIONS: Therapeutic exercises, Therapeutic activity, Neuromuscular re-education, Balance training, Gait training, Patient/Family education, Joint manipulation, Joint mobilization, Stair training, Aquatic Therapy, Dry Needling, Electrical stimulation, Spinal manipulation, Spinal mobilization, Cryotherapy, Moist heat, Taping, Traction, Ultrasound, Ionotophoresis 6m/ml Dexamethasone, Manual therapy, and Re-evaluation   PLAN FOR NEXT SESSION: MMT next week.   JMyrene Galas PTA 03/06/22 10:54 AM   [place] [place] [address]Phone # [phone]  Fax [phone]

## 2022-03-11 ENCOUNTER — Encounter: Payer: Self-pay | Admitting: Rehabilitative and Restorative Service Providers"

## 2022-03-11 ENCOUNTER — Ambulatory Visit: Payer: Medicare Other | Admitting: Rehabilitative and Restorative Service Providers"

## 2022-03-11 DIAGNOSIS — R262 Difficulty in walking, not elsewhere classified: Secondary | ICD-10-CM | POA: Diagnosis not present

## 2022-03-11 DIAGNOSIS — M6281 Muscle weakness (generalized): Secondary | ICD-10-CM

## 2022-03-11 DIAGNOSIS — R2689 Other abnormalities of gait and mobility: Secondary | ICD-10-CM | POA: Diagnosis not present

## 2022-03-11 NOTE — Therapy (Signed)
OUTPATIENT PHYSICAL THERAPY TREATMENT NOTE   Patient Name: Sara Frye MRN: [id number] DOB:04/16/1929, 88 y.o., female Today's Date: 03/11/2022  PCP:  Deland Pretty, MD  REFERRING PROVIDER: Deland Pretty, MD   END OF SESSION:   PT End of Session - 03/11/22 1015     Visit Number 9    Date for PT Re-Evaluation 03/13/22    Authorization Type Medicare    Progress Note Due on Visit 10    PT Start Time 1010    PT Stop Time 1050    PT Time Calculation (min) 40 min    Activity Tolerance Patient tolerated treatment well    Behavior During Therapy WFL for tasks assessed/performed                Past Medical History:  Diagnosis Date   Arthritis    spine   Atrial fibrillation ([place])    Brain aneurysm 01/07/2017   Dysrhythmia    afib   H/O cerebral aneurysm repair ACA S/P Coiling by Dr. Estanislado Pandy 01/07/17 01/07/2017   Headache    with Eliquis   Hypertension    Hypothyroidism    Maternal blood transfusion    Neuromuscular disorder ([place])    spinal injury- "I'm unsure of walking, I'm very careful"   Shingles    Stroke ([place]) 11/2015   Past Surgical History:  Procedure Laterality Date   ABDOMINAL HYSTERECTOMY     ovaries removed   APPENDECTOMY     CARPAL TUNNEL RELEASE Bilateral    CATARACT EXTRACTION     CHOLECYSTECTOMY     IR ANGIO INTRA EXTRACRAN SEL COM CAROTID INNOMINATE BILAT MOD SED  12/30/2016   IR ANGIO INTRA EXTRACRAN SEL COM CAROTID INNOMINATE BILAT MOD SED  03/08/2018   IR ANGIO INTRA EXTRACRAN SEL INTERNAL CAROTID UNI R MOD SED  01/07/2017   IR ANGIO VERTEBRAL SEL SUBCLAVIAN INNOMINATE UNI R MOD SED  12/30/2016   IR ANGIO VERTEBRAL SEL VERTEBRAL BILAT MOD SED  03/08/2018   IR ANGIO VERTEBRAL SEL VERTEBRAL UNI L MOD SED  12/30/2016   IR ANGIOGRAM FOLLOW UP STUDY  01/07/2017   IR ANGIOGRAM FOLLOW UP STUDY  01/07/2017   IR ANGIOGRAM FOLLOW UP STUDY  01/07/2017   IR NEURO EACH ADD'L AFTER BASIC UNI RIGHT (MS)  01/07/2017   IR RADIOLOGIST EVAL & MGMT   11/24/2016   IR RADIOLOGIST EVAL & MGMT  01/25/2017   IR TRANSCATH/EMBOLIZ  01/07/2017   JOINT REPLACEMENT Bilateral    arthroscopic - knees   RADIOLOGY WITH ANESTHESIA N/A 12/30/2016   Procedure: RADIOLOGY WITH ANESTHESIA  EMBOLIZATION;  Surgeon: Luanne Bras, MD;  Location: [facility];  Service: Radiology;  Laterality: N/A;   RADIOLOGY WITH ANESTHESIA N/A 01/07/2017   Procedure: EMBOLIZATION;  Surgeon: Luanne Bras, MD;  Location: [facility];  Service: Radiology;  Laterality: N/A;   Patient Active Problem List   Diagnosis Date Noted   BPPV (benign paroxysmal positional vertigo) 04/16/2017   History of stroke 03/09/2017   H/O cerebral aneurysm repair ACA S/P Coiling by Dr. Estanislado Pandy 01/07/17 01/07/2017   Chronic anticoagulation 04/16/2016   Speech disturbance 12/17/2015   Atrial fibrillation ([place]) 12/17/2015   Hypertension 12/17/2015   Hyperlipidemia 12/17/2015   Dizziness 12/17/2015   Stroke (cerebrum) (HCC)     REFERRING DIAG: R29.898 (ICD-10-CM) - Leg weakness     THERAPY DIAG:  Other abnormalities of gait and mobility  Muscle weakness (generalized)  Difficulty in walking, not elsewhere classified  Rationale for Evaluation and Treatment Rehabilitation  PERTINENT HISTORY: HTN, Paroxysmal Afib, bilat MCA and ACA multifocal punctate, Hx of cerebral aneurysm repair ACA s/p coiling on 01/07/17, OA  PRECAUTIONS:  Fall  SUBJECTIVE: Using walker 100% PAIN:  Are you having pain? No   OBJECTIVE: (objective measures completed at initial evaluation unless otherwise dated)  DIAGNOSTIC FINDINGS:   N/A   PATIENT SURVEYS:   02/25/2022: The Activities-specific Balance Confidence (ABC) Scale:   0%      10       20       30        40       50       60       70       80       90       100% No Confidence                                                                                      Completely Confident   How confident are you that you will not lose your balance or become  unsteady when you.   .walk around the house? 100% .walk up or down stairs?  40% .bend over and pick up a slipper from the front of a closet floor? 0% .reach for a small can off a shelf at eye level? 100% .stand on your tip toes and reach for something above your head? 30% .stand on a chair and reach for something? 0% .sweep the floor? 70% .walk outside the house to a car parked in the driveway? 61% .get into or out of a car? 80% .walk across a parking lot to the mall? 50% .walk up or down a ramp? 50% .walk in a crowded mall where people rapidly walk past you? 0% .are bumped into by people as you walk through the mall? 0% .step onto or off of an escalator while you are holding onto a railing? 20% .step onto or off an escalator while you are holding onto parcels such that you cannot hold onto the railing? 0% .walk outside on icy sidewalks? 0%   Total ABC score: 620   Scoring:  620/16=61.25% of self confidence    01/20/2022:  ABC scale 22% The Activities-specific Balance Confidence (ABC) Scale:   0%      10       20       30        40       50       60       70       80       90       100% No Confidence  Completely Confident   How confident are you that you will not lose your balance or become unsteady when you.   .walk around the house? 80% .walk up or down stairs?  20% .bend over and pick up a slipper from the front of a closet floor? 0% .reach for a small can off a shelf at eye level? 100% .stand on your tip toes and reach for something above your head? 0% .stand on a chair and reach for something? 0% .sweep the floor? 50% .walk outside the house to a car parked in the driveway? 63% .get into or out of a car? 50% .walk across a parking lot to the mall? 0% .walk up or down a ramp? 0% .walk in a crowded mall where people rapidly walk past you? 0% .are bumped into by people as you walk through  the mall? 0% .step onto or off of an escalator while you are holding onto a railing? 20% .step onto or off an escalator while you are holding onto parcels such that you cannot hold onto the railing? 0% .walk outside on icy sidewalks? 0%   Total ABC score: 350   Scoring:  350/16=22% of self confidence     COGNITION:           Overall cognitive status: Within functional limits for tasks assessed                          SENSATION: WFL   EDEMA:  Minimal edema noted in left LE, cardiologist aware   MUSCLE LENGTH: Hamstring tightness noted   POSTURE: rounded shoulders, forward head, and flexed trunk    LOWER EXTREMITY MMT: 01/20/2022:  RLE strength of 4/5, LLE strength of 4-/5   FUNCTIONAL TESTS:  03/11/2022: Timed up and go (TUG): 27.3 sec on first attempt, then 21.9 sec on second attempt without use of AD Timed up and go (TUG): 19.8 sec with 4WRW 5 times sit to stand: 17.5 sec with use of UE  02/25/2022:  3 min walk test:  159 ft without assistive device  01/20/2022: 5 times sit to stand: 30.99 sec with use of UE Timed up and go (TUG): 35.77 sec without use of AD with CGA   GAIT: Distance walked: 50 ft Assistive device utilized: None Level of assistance: CGA and Min A Comments: Attempted ambulation in clinic with RW and pt requires SBA with cuing for looking ahead instead of down at floor.       TODAY'S TREATMENT:  03/11/2022: Nustep level 3 (blue machine) x6 min with PT present to discuss status TUG with and without 4WRW Sit to/from stand x10 Ambulation outside on sidewalks with 4WRW, including ramp negotiation and 2 steps with use of 4WRW.  Pt able to navigate ramps without difficulty with use of 4WRW. Seated with 2# ankle weights:  LAQ, marching, hip abduction scissors.  BLE 2x10 Seated hip adduction ball squeeze 2x10   03/06/22: Nustep L2 ( older model ) 5 min Seated with 2# ankle weights:  LAQ, marching,  BLE 2x10 Yellow loop clamshells 2x10  Ambulation with  Rollator entire building 1x then rest 1 min, repeat 1x more    03/04/2022: Nustep Level 5 x5 minutes with PT present to discuss status Practiced using new 4WRW and educated about brakes and how to safely utilize during transfers 3 min walk test:  245 ft with 4WRW Seated with 1.5# ankle weights:  LAQ, marching, hip abduction scissors.  BLE 2x10 Ambulation outside  with 4WRW to negotiate curbs and curb cutouts with SBA and cuing    PATIENT EDUCATION:  Education details: Issued HEP Person educated: Patient and Child(ren) Education method: Explanation, Demonstration, and Handouts Education comprehension: verbalized understanding and returned demonstration     HOME EXERCISE PROGRAM: Access Code: [email or web address] URL: [email or web address] Date: 02/05/2022 Prepared by: Claiborne Billings  Exercises - Seated Long Arc Quad  - 1 x daily - 7 x weekly - 2 sets - 10 reps - Seated March  - 1 x daily - 7 x weekly - 2 sets - 10 reps - Seated Heel Raise  - 1 x daily - 7 x weekly - 2 sets - 10 reps - Seated Hip Adduction Isometrics with Ball  - 1 x daily - 7 x weekly - 2 sets - 10 reps - Heel Raises with Counter Support  - 1 x daily - 7 x weekly - 2 sets - 10 reps - Standing Hip Abduction with Counter Support  - 1 x daily - 7 x weekly - 2 sets - 5 reps  ASSESSMENT:   CLINICAL IMPRESSION:  Ms Janeway continues to deny pain and reports use of 4WRW.  Pt progressed with improved time on both 5 times sit to/from stand and with TUG and has met TUG goal with 4WRW.  Pt able to demonstrate safe negotiation of ramps outside without loss of balance with use of 4WRW.  Pt is on track for discharge next visit, as scheduled.  If pt continues with progress towards goal, will discharge pt next visit, as anticipated to continue with HEP.  OBJECTIVE IMPAIRMENTS decreased balance, difficulty walking, decreased strength, postural dysfunction, and pain.    ACTIVITY LIMITATIONS lifting, bending, standing, and stairs    PARTICIPATION LIMITATIONS: cleaning, shopping, and community activity   PERSONAL FACTORS 3+ comorbidities: Hx of CVA, Hx of aneurysm repair, OA  are also affecting patient's functional outcome.    REHAB POTENTIAL: Good   CLINICAL DECISION MAKING: Evolving/moderate complexity   EVALUATION COMPLEXITY: Moderate     GOALS: Goals reviewed with patient? Yes   SHORT TERM GOALS: Target date: 02/10/2022    Pt will be independent with initial HEP. Baseline: Goal status: Met  2.  Pt to be able to demonstrate safe ambulation in clinic with use of RW. Baseline:  Goal status: Met     LONG TERM GOALS: Target date: 03/17/2022    Pt will be independent with advanced HEP. Baseline:  Goal status: Ongoing   2.  Pt able to negotiate ambulation up a ramp with LRAD and mod I to allow her to visit her daughter. Baseline:  Goal status: Goal Met 03/11/2022   3.  Pt to increase ABC Confidence Scale to at least 50% to demonstrate decreased risk of falling. Baseline: 22% Goal status: Goal Met 02/25/2022 (62% confident)   4.  Pt will increase BLE strength to at least 4+/5 to allow pt to perform transfers with increased ease. Baseline:  Goal status: INITIAL              5.  Pt will improve TUG to less than 20 seconds with LRAD to decrease her risk of falling.                       Baseline: 36 sec without assistive device and CGA                       Goal status:  Goal Met with 4WRW on  03/11/2022     PLAN: PT FREQUENCY: 2x/week   PT DURATION: 8 weeks   PLANNED INTERVENTIONS: Therapeutic exercises, Therapeutic activity, Neuromuscular re-education, Balance training, Gait training, Patient/Family education, Joint manipulation, Joint mobilization, Stair training, Aquatic Therapy, Dry Needling, Electrical stimulation, Spinal manipulation, Spinal mobilization, Cryotherapy, Moist heat, Taping, Traction, Ultrasound, Ionotophoresis 78m/ml Dexamethasone, Manual therapy, and Re-evaluation   PLAN FOR NEXT  SESSION: Discharge if all goals met.   SJuel Burrow PT 03/11/22 10:57 AM   [facility] [address] SB[place]acna [place] [address]Phone # [phone] [phone]

## 2022-03-13 ENCOUNTER — Encounter: Payer: Self-pay | Admitting: Rehabilitative and Restorative Service Providers"

## 2022-03-13 ENCOUNTER — Ambulatory Visit: Payer: Medicare Other | Admitting: Rehabilitative and Restorative Service Providers"

## 2022-03-13 DIAGNOSIS — M6281 Muscle weakness (generalized): Secondary | ICD-10-CM | POA: Diagnosis not present

## 2022-03-13 DIAGNOSIS — R2689 Other abnormalities of gait and mobility: Secondary | ICD-10-CM

## 2022-03-13 DIAGNOSIS — R262 Difficulty in walking, not elsewhere classified: Secondary | ICD-10-CM | POA: Diagnosis not present

## 2022-03-13 NOTE — Therapy (Signed)
OUTPATIENT PHYSICAL THERAPY TREATMENT NOTE AND DISCHARGE SUMMARY   Patient Name: Sara Frye MRN: [id number] DOB:[birth year], 88 y.o., female Today's Date: 03/13/2022  PCP:  Deland Pretty, MD  REFERRING PROVIDER: Deland Pretty, MD   END OF SESSION:   PT End of Session - 03/13/22 0936     Visit Number 10    Date for PT Re-Evaluation 03/13/22    Authorization Type Medicare    PT Start Time 0930    PT Stop Time 1010    PT Time Calculation (min) 40 min    Activity Tolerance Patient tolerated treatment well    Behavior During Therapy [facility] for tasks assessed/performed                Past Medical History:  Diagnosis Date   Arthritis    spine   Atrial fibrillation ([place])    Brain aneurysm 01/07/2017   Dysrhythmia    afib   H/O cerebral aneurysm repair ACA S/P Coiling by Dr. Estanislado Pandy 01/07/17 01/07/2017   Headache    with Eliquis   Hypertension    Hypothyroidism    Maternal blood transfusion    Neuromuscular disorder ([place])    spinal injury- "I'm unsure of walking, I'm very careful"   Shingles    Stroke ([place]) 11/2015   Past Surgical History:  Procedure Laterality Date   ABDOMINAL HYSTERECTOMY     ovaries removed   APPENDECTOMY     CARPAL TUNNEL RELEASE Bilateral    CATARACT EXTRACTION     CHOLECYSTECTOMY     IR ANGIO INTRA EXTRACRAN SEL COM CAROTID INNOMINATE BILAT MOD SED  12/30/2016   IR ANGIO INTRA EXTRACRAN SEL COM CAROTID INNOMINATE BILAT MOD SED  03/08/2018   IR ANGIO INTRA EXTRACRAN SEL INTERNAL CAROTID UNI R MOD SED  01/07/2017   IR ANGIO VERTEBRAL SEL SUBCLAVIAN INNOMINATE UNI R MOD SED  12/30/2016   IR ANGIO VERTEBRAL SEL VERTEBRAL BILAT MOD SED  03/08/2018   IR ANGIO VERTEBRAL SEL VERTEBRAL UNI L MOD SED  12/30/2016   IR ANGIOGRAM FOLLOW UP STUDY  01/07/2017   IR ANGIOGRAM FOLLOW UP STUDY  01/07/2017   IR ANGIOGRAM FOLLOW UP STUDY  01/07/2017   IR NEURO EACH ADD'L AFTER BASIC UNI RIGHT (MS)  01/07/2017   IR RADIOLOGIST EVAL & MGMT  11/24/2016    IR RADIOLOGIST EVAL & MGMT  01/25/2017   IR TRANSCATH/EMBOLIZ  01/07/2017   JOINT REPLACEMENT Bilateral    arthroscopic - knees   RADIOLOGY WITH ANESTHESIA N/A 12/30/2016   Procedure: RADIOLOGY WITH ANESTHESIA  EMBOLIZATION;  Surgeon: Luanne Bras, MD;  Location: [place];  Service: Radiology;  Laterality: N/A;   RADIOLOGY WITH ANESTHESIA N/A 01/07/2017   Procedure: EMBOLIZATION;  Surgeon: Luanne Bras, MD;  Location: [place];  Service: Radiology;  Laterality: N/A;   Patient Active Problem List   Diagnosis Date Noted   BPPV (benign paroxysmal positional vertigo) 04/16/2017   History of stroke 03/09/2017   H/O cerebral aneurysm repair ACA S/P Coiling by Dr. Estanislado Pandy 01/07/17 01/07/2017   Chronic anticoagulation 04/16/2016   Speech disturbance 12/17/2015   Atrial fibrillation ([place]) 12/17/2015   Hypertension 12/17/2015   Hyperlipidemia 12/17/2015   Dizziness 12/17/2015   Stroke (cerebrum) (HCC)     REFERRING DIAG: R29.898 (ICD-10-CM) - Leg weakness     THERAPY DIAG:  Other abnormalities of gait and mobility  Muscle weakness (generalized)  Difficulty in walking, not elsewhere classified  Rationale for Evaluation and Treatment Rehabilitation  PERTINENT HISTORY: HTN, Paroxysmal Afib, bilat  MCA and ACA multifocal punctate, Hx of cerebral aneurysm repair ACA s/p coiling on 01/07/17, OA  PRECAUTIONS:  Fall  SUBJECTIVE: Pt reports that she didn't sleep well last night and is tired today.  PAIN:  Are you having pain? No   OBJECTIVE: (objective measures completed at initial evaluation unless otherwise dated)  DIAGNOSTIC FINDINGS:   N/A   PATIENT SURVEYS:   02/25/2022: The Activities-specific Balance Confidence (ABC) Scale:   0%      10       20       30        40       50       60       70       80       90       100% No Confidence                                                                                      Completely Confident   How confident are you that you  will not lose your balance or become unsteady when you.   .walk around the house? 100% .walk up or down stairs?  40% .bend over and pick up a slipper from the front of a closet floor? 0% .reach for a small can off a shelf at eye level? 100% .stand on your tip toes and reach for something above your head? 30% .stand on a chair and reach for something? 0% .sweep the floor? 70% .walk outside the house to a car parked in the driveway? 44% .get into or out of a car? 80% .walk across a parking lot to the mall? 50% .walk up or down a ramp? 50% .walk in a crowded mall where people rapidly walk past you? 0% .are bumped into by people as you walk through the mall? 0% .step onto or off of an escalator while you are holding onto a railing? 20% .step onto or off an escalator while you are holding onto parcels such that you cannot hold onto the railing? 0% .walk outside on icy sidewalks? 0%   Total ABC score: 620   Scoring:  620/16=61.25% of self confidence    01/20/2022:  ABC scale 22% The Activities-specific Balance Confidence (ABC) Scale:   0%      10       20       30        40       50       60       70       80       90       100% No Confidence  Completely Confident   How confident are you that you will not lose your balance or become unsteady when you.   .walk around the house? 80% .walk up or down stairs?  20% .bend over and pick up a slipper from the front of a closet floor? 0% .reach for a small can off a shelf at eye level? 100% .stand on your tip toes and reach for something above your head? 0% .stand on a chair and reach for something? 0% .sweep the floor? 50% .walk outside the house to a car parked in the driveway? 50% .get into or out of a car? 50% .walk across a parking lot to the mall? 0% .walk up or down a ramp? 0% .walk in a crowded mall where people rapidly walk past you? 0% .are  bumped into by people as you walk through the mall? 0% .step onto or off of an escalator while you are holding onto a railing? 20% .step onto or off an escalator while you are holding onto parcels such that you cannot hold onto the railing? 0% .walk outside on icy sidewalks? 0%   Total ABC score: 350   Scoring:  350/16=22% of self confidence     COGNITION:           Overall cognitive status: Within functional limits for tasks assessed                          SENSATION: WFL   EDEMA:  Minimal edema noted in left LE, cardiologist aware   MUSCLE LENGTH: Hamstring tightness noted   POSTURE: rounded shoulders, forward head, and flexed trunk    LOWER EXTREMITY MMT:  03/13/2022:  BLE strength of 4+/5 grossly throughout 01/20/2022:  RLE strength of 4/5, LLE strength of 4-/5   FUNCTIONAL TESTS:  03/11/2022: Timed up and go (TUG): 27.3 sec on first attempt, then 21.9 sec on second attempt without use of AD Timed up and go (TUG): 19.8 sec with 4WRW 5 times sit to stand: 17.5 sec with use of UE  02/25/2022:  3 min walk test:  159 ft without assistive device  01/20/2022: 5 times sit to stand: 30.99 sec with use of UE Timed up and go (TUG): 35.77 sec without use of AD with CGA   GAIT: Distance walked: 50 ft Assistive device utilized: None Level of assistance: CGA and Min A Comments: Attempted ambulation in clinic with RW and pt requires SBA with cuing for looking ahead instead of down at floor.       TODAY'S TREATMENT:  03/13/2022: Nustep level 4 (green machine) x6 min with PT present to discuss status Seated with 2# ankle weights:  LAQ, marching, hip abduction scissors.  BLE 2x10 Sit to/from stand x10 Seated hip adduction ball squeeze 2x10 Yellow loop clamshells 2x10  Ambulation with 4WRW entire building 1x then rest 1 min, repeat 1x more Reviewed safety at home and use of 4WRW  03/11/2022: Nustep level 3 (blue machine) x6 min with PT present to discuss status TUG with and  without 4WRW Sit to/from stand x10 Ambulation outside on sidewalks with 4WRW, including ramp negotiation and 2 steps with use of 4WRW.  Pt able to navigate ramps without difficulty with use of 4WRW. Seated with 2# ankle weights:  LAQ, marching, hip abduction scissors.  BLE 2x10 Seated hip adduction ball squeeze 2x10   03/06/22: Nustep L2 ( older model ) 5 min Seated with 2# ankle weights:  LAQ, marching,  BLE  2x10 Yellow loop clamshells 2x10  Ambulation with Rollator entire building 1x then rest 1 min, repeat 1x more     PATIENT EDUCATION:  Education details: Issued HEP Person educated: Patient and Child(ren) Education method: Explanation, Demonstration, and Handouts Education comprehension: verbalized understanding and returned demonstration     HOME EXERCISE PROGRAM: Access Code: [email or web address] URL: [email or web address] Date: 02/05/2022 Prepared by: Claiborne Billings  Exercises - Seated Long Arc Quad  - 1 x daily - 7 x weekly - 2 sets - 10 reps - Seated March  - 1 x daily - 7 x weekly - 2 sets - 10 reps - Seated Heel Raise  - 1 x daily - 7 x weekly - 2 sets - 10 reps - Seated Hip Adduction Isometrics with Ball  - 1 x daily - 7 x weekly - 2 sets - 10 reps - Heel Raises with Counter Support  - 1 x daily - 7 x weekly - 2 sets - 10 reps - Standing Hip Abduction with Counter Support  - 1 x daily - 7 x weekly - 2 sets - 5 reps  ASSESSMENT:   CLINICAL IMPRESSION:  Ms Casseus continues to deny pain and reports use of 4WRW.  Pt has made excellent progress with skilled PT and has met all goals.  Pt has made improvements with LE strength and is able to ambulate increased distances with improved balance with use of 4WRW.  Pt to continue to perform HEP and practice improved safety with use of 4WRW.  Pt discharged from skilled outpatient PT at this time.  OBJECTIVE IMPAIRMENTS decreased balance, difficulty walking, decreased strength, postural dysfunction, and pain.    ACTIVITY LIMITATIONS  lifting, bending, standing, and stairs   PARTICIPATION LIMITATIONS: cleaning, shopping, and community activity   PERSONAL FACTORS 3+ comorbidities: Hx of CVA, Hx of aneurysm repair, OA  are also affecting patient's functional outcome.    REHAB POTENTIAL: Good   CLINICAL DECISION MAKING: Evolving/moderate complexity   EVALUATION COMPLEXITY: Moderate     GOALS: Goals reviewed with patient? Yes   SHORT TERM GOALS: Target date: 02/10/2022    Pt will be independent with initial HEP. Baseline: Goal status: Met  2.  Pt to be able to demonstrate safe ambulation in clinic with use of RW. Baseline:  Goal status: Met     LONG TERM GOALS: Target date: 03/17/2022    Pt will be independent with advanced HEP. Baseline:  Goal status: Goal Met 03/13/22   2.  Pt able to negotiate ambulation up a ramp with LRAD and mod I to allow her to visit her daughter. Baseline:  Goal status: Goal Met 03/11/2022   3.  Pt to increase ABC Confidence Scale to at least 50% to demonstrate decreased risk of falling. Baseline: 22% Goal status: Goal Met 02/25/2022 (62% confident)   4.  Pt will increase BLE strength to at least 4+/5 to allow pt to perform transfers with increased ease. Baseline:  Goal status: Goal Met 03/13/2022              5.  Pt will improve TUG to less than 20 seconds with LRAD to decrease her risk of falling.                       Baseline: 36 sec without assistive device and CGA                       Goal status:  Goal Met with  8WGY on 03/11/2022     PLAN: PT FREQUENCY: 2x/week   PT DURATION: 8 weeks   PLANNED INTERVENTIONS: Therapeutic exercises, Therapeutic activity, Neuromuscular re-education, Balance training, Gait training, Patient/Family education, Joint manipulation, Joint mobilization, Stair training, Aquatic Therapy, Dry Needling, Electrical stimulation, Spinal manipulation, Spinal mobilization, Cryotherapy, Moist heat, Taping, Traction, Ultrasound, Ionotophoresis 25m/ml  Dexamethasone, Manual therapy, and Re-evaluation   PLAN FOR NEXT SESSION: N/A discharged on 03/13/22    PHYSICAL THERAPY DISCHARGE SUMMARY   Patient agrees to discharge. Patient goals were met. Patient is being discharged due to meeting the stated rehab goals.     SJuel Burrow PT 03/13/22 10:16 AM   [facility] IncSpecial[facility] Rehab Services [address] [place]burg[place]anborn [place] [address]Phone # [phone] [phone]

## 2022-03-19 DIAGNOSIS — R350 Frequency of micturition: Secondary | ICD-10-CM | POA: Diagnosis not present

## 2022-03-20 ENCOUNTER — Ambulatory Visit: Payer: Medicare Other | Admitting: Rehabilitative and Restorative Service Providers"

## 2022-04-14 DIAGNOSIS — I10 Essential (primary) hypertension: Secondary | ICD-10-CM | POA: Diagnosis not present

## 2022-04-14 DIAGNOSIS — E039 Hypothyroidism, unspecified: Secondary | ICD-10-CM | POA: Diagnosis not present

## 2022-04-14 DIAGNOSIS — E7800 Pure hypercholesterolemia, unspecified: Secondary | ICD-10-CM | POA: Diagnosis not present

## 2022-04-16 DIAGNOSIS — R350 Frequency of micturition: Secondary | ICD-10-CM | POA: Diagnosis not present

## 2022-04-21 DIAGNOSIS — M810 Age-related osteoporosis without current pathological fracture: Secondary | ICD-10-CM | POA: Diagnosis not present

## 2022-04-21 DIAGNOSIS — I4891 Unspecified atrial fibrillation: Secondary | ICD-10-CM | POA: Diagnosis not present

## 2022-04-21 DIAGNOSIS — Z7901 Long term (current) use of anticoagulants: Secondary | ICD-10-CM | POA: Diagnosis not present

## 2022-04-21 DIAGNOSIS — E039 Hypothyroidism, unspecified: Secondary | ICD-10-CM | POA: Diagnosis not present

## 2022-04-21 DIAGNOSIS — B0229 Other postherpetic nervous system involvement: Secondary | ICD-10-CM | POA: Diagnosis not present

## 2022-04-21 DIAGNOSIS — Z8673 Personal history of transient ischemic attack (TIA), and cerebral infarction without residual deficits: Secondary | ICD-10-CM | POA: Diagnosis not present

## 2022-04-21 DIAGNOSIS — E7800 Pure hypercholesterolemia, unspecified: Secondary | ICD-10-CM | POA: Diagnosis not present

## 2022-04-21 DIAGNOSIS — R600 Localized edema: Secondary | ICD-10-CM | POA: Diagnosis not present

## 2022-04-21 DIAGNOSIS — I10 Essential (primary) hypertension: Secondary | ICD-10-CM | POA: Diagnosis not present

## 2022-04-21 DIAGNOSIS — E559 Vitamin D deficiency, unspecified: Secondary | ICD-10-CM | POA: Diagnosis not present

## 2022-04-30 ENCOUNTER — Ambulatory Visit: Payer: Medicare Other | Admitting: Internal Medicine

## 2022-05-05 ENCOUNTER — Ambulatory Visit: Payer: Medicare Other | Admitting: Internal Medicine

## 2022-05-05 ENCOUNTER — Encounter: Payer: Self-pay | Admitting: Internal Medicine

## 2022-05-05 VITALS — BP 140/83 | HR 84 | Temp 97.9°F | Resp 16 | Ht 60.0 in | Wt 180.0 lb

## 2022-05-05 DIAGNOSIS — E7800 Pure hypercholesterolemia, unspecified: Secondary | ICD-10-CM | POA: Diagnosis not present

## 2022-05-05 DIAGNOSIS — I480 Paroxysmal atrial fibrillation: Secondary | ICD-10-CM | POA: Diagnosis not present

## 2022-05-05 DIAGNOSIS — I10 Essential (primary) hypertension: Secondary | ICD-10-CM

## 2022-05-05 NOTE — Progress Notes (Signed)
Primary Physician/Referring:  Deland Pretty, MD  Patient ID: Sara Frye, female    DOB: 02-Sep-1928, 88 y.o.   MRN: [id number]  No chief complaint on file.   HPI: Sara Frye  is a 88 y.o. female  with hypertension, hyperlipidemia,  paroxysmal Afib, bilateral MCA and ACA multifocal punctate infarcts, after which she was started on anticaogulation on 12/18/15. She was also found to have cerebral aneurysm for which she underwent endovascular obliteration of anterior communicating artery aneurysm with stent assisted coiling by Dr. Estanislado Pandy on 01/07/17. She continues to follow closely with Dr.Deveshwar for management of cerebral aneurysm with stenting.  Patient presents for 75-monthfollow-up.  At last office visit patient was stable from a cardiovascular standpoint, therefore no changes were made.   Denies chest pain, palpitations, orthopnea, PND.    Past Medical History:  Diagnosis Date   Arthritis    spine   Atrial fibrillation ([place]    Brain aneurysm 01/07/2017   Dysrhythmia    afib   H/O cerebral aneurysm repair ACA S/P Coiling by Dr. DEstanislado Pandy5/17/18 01/07/2017   Headache    with Eliquis   Hypertension    Hypothyroidism    Maternal blood transfusion    Neuromuscular disorder ([place]    spinal injury- "I'm unsure of walking, I'm very careful"   Shingles    [place]   Family History  Problem Relation Age of Onset   Cancer Mother    Breast cancer Sister    Heart failure Brother        deceased age 454  Cancer Brother    Cancer Brother    Cancer Sister    Past Surgical History:  Procedure Laterality Date   ABDOMINAL HYSTERECTOMY     ovaries removed   APPENDECTOMY     CARPAL TUNNEL RELEASE Bilateral    CATARACT EXTRACTION     CHOLECYSTECTOMY     IR ANGIO INTRA EXTRACRAN SEL COM CAROTID INNOMINATE BILAT MOD SED  12/30/2016   IR ANGIO INTRA EXTRACRAN SEL COM CAROTID INNOMINATE BILAT MOD SED  03/08/2018   IR ANGIO INTRA EXTRACRAN SEL INTERNAL  CAROTID UNI R MOD SED  01/07/2017   IR ANGIO VERTEBRAL SEL SUBCLAVIAN INNOMINATE UNI R MOD SED  12/30/2016   IR ANGIO VERTEBRAL SEL VERTEBRAL BILAT MOD SED  03/08/2018   IR ANGIO VERTEBRAL SEL VERTEBRAL UNI L MOD SED  12/30/2016   IR ANGIOGRAM FOLLOW UP STUDY  01/07/2017   IR ANGIOGRAM FOLLOW UP STUDY  01/07/2017   IR ANGIOGRAM FOLLOW UP STUDY  01/07/2017   IR NEURO EACH ADD'L AFTER BASIC UNI RIGHT (MS)  01/07/2017   IR RADIOLOGIST EVAL & MGMT  11/24/2016   IR RADIOLOGIST EVAL & MGMT  01/25/2017   IR TRANSCATH/EMBOLIZ  01/07/2017   JOINT REPLACEMENT Bilateral    arthroscopic - knees   RADIOLOGY WITH ANESTHESIA N/A 12/30/2016   Procedure: RADIOLOGY WITH ANESTHESIA  EMBOLIZATION;  Surgeon: DLuanne Bras MD;  [facility]  Service: Radiology;  Laterality: N/A;   RADIOLOGY WITH ANESTHESIA N/A 01/07/2017   Procedure: EMBOLIZATION;  Surgeon: DLuanne Bras MD;  Location: [facility]  Service: Radiology;  Laterality: N/A;   Social History   Tobacco Use   Smoking status: Never   Smokeless tobacco: Never  Substance Use Topics   Alcohol use: No     ROS   Review of Systems  Constitutional: Negative for malaise/fatigue.  Cardiovascular:  Positive for claudication (stable) and dyspnea on exertion (chronic, stable).  Negative for chest pain, leg swelling, near-syncope, orthopnea, palpitations, paroxysmal nocturnal dyspnea and syncope.  Gastrointestinal:  Hemorrhoids: no recent bleed.  Neurological:  Negative for dizziness.  All other systems reviewed and are negative.   Objective  Blood pressure (!) 140/83, pulse 84, temperature 97.9 F (36.6 C), temperature source Temporal, resp. rate 16, height 5' (1.524 m), weight 180 lb (81.6 kg), SpO2 94 %. Body mass index is 35.15 kg/m.    05/05/2022   11:16 AM 10/28/2021   11:05 AM 04/30/2021   10:21 AM  Vitals with BMI  Height '5\' 0"'$  '5\' 0"'$  '5\' 0"'$   Weight 180 lbs 176 lbs 10 oz 185 lbs  BMI 35.15 60.63 01.60  Systolic 109 323 557   Diastolic 83 71 58  Pulse 84 84 61   Physical Exam Vitals reviewed.  Constitutional:      Appearance: She is obese.     Comments: Moderately obese  HENT:     Head: Normocephalic and atraumatic.  Neck:     Vascular: No JVD.  Cardiovascular:     Rate and Rhythm: Normal rate and regular rhythm.     Pulses:          Carotid pulses are 2+ on the right side and 2+ on the left side.      Femoral pulses are 2+ on the right side and 2+ on the left side.      Popliteal pulses are 1+ on the right side and 1+ on the left side.       Dorsalis pedis pulses are 1+ on the right side and 1+ on the left side.       Posterior tibial pulses are 0 on the right side and 0 on the left side.     Heart sounds: Murmur heard.     Harsh midsystolic murmur is present with a grade of 2/6 at the upper right sternal border.     No gallop.  Pulmonary:     Effort: Pulmonary effort is normal.     Breath sounds: Normal breath sounds.  Abdominal:     Comments: Obese  Musculoskeletal:     Right lower leg: Edema (trace) present.     Left lower leg: Edema (trace) present.  Skin:    General: Skin is warm and dry.    Laboratory examination:      Latest Ref Rng & Units 10/09/2019    8:12 AM 04/18/2019    1:25 PM 03/08/2018    7:10 AM  CMP  Glucose 65 - 99 mg/dL 85   100   BUN 8 - 27 mg/dL 18   10   Creatinine 0.57 - 1.00 mg/dL 0.84  0.83  0.83   Sodium 134 - 144 mmol/L 144   140   Potassium 3.5 - 5.2 mmol/L 5.5   3.6   Chloride 96 - 106 mmol/L 106   105   CO2 20 - 29 mmol/L 25   26   Calcium 8.7 - 10.3 mg/dL 10.1   9.4   Total Protein 6.0 - 8.5 g/dL 7.2     Total Bilirubin 0.0 - 1.2 mg/dL 0.3     Alkaline Phos 39 - 117 IU/L 61     AST 0 - 40 IU/L 17     ALT 0 - 32 IU/L 15         Latest Ref Rng & Units 10/09/2019    8:12 AM 03/08/2018    7:10 AM 01/08/2017    7:05 AM  CBC  WBC 3.4 - 10.8 x10E3/uL 5.7  6.3  8.8   Hemoglobin 11.1 - 15.9 g/dL 12.2  13.7  11.4   Hematocrit 34.0 - 46.6 % 36.6  40.9   32.4   Platelets 150 - 450 x10E3/uL 262  243  197    Lipid Panel     Component Value Date/Time   CHOL 136 10/09/2019 0812   TRIG 75 10/09/2019 0812   HDL 59 10/09/2019 0812   CHOLHDL 3.0 12/17/2015 0436   VLDL 24 12/17/2015 0436   LDLCALC 62 10/09/2019 0812   HEMOGLOBIN A1C Lab Results  Component Value Date   HGBA1C 6.1 (H) 12/17/2015   MPG 128 12/17/2015   TSH No results for input(s): "TSH" in the last 8760 hours.   External labs : 01/01/2021: Total cholesterol 144, HDL 56, LDL 60, triglycerides 93  12/27/2019:  HDL 57, LDL 60, total cholesterol 136, triglycerides 114 BUN 18, Cr 0.84 TSH 3.62  07/11/2018 Cholesterol, total 133.000 12/15/2017 HDL 61.000 12/15/2017 LDL 57.000 12/15/2017 Triglycerides 111.000 12/22/2018  A1C N/D Hemoglobin 13.700 G/ 07/11/2018, INR 1.080 03/08/2018 Platelets 280.000 X 07/11/2018  Creatinine, Serum 0.840 MG/ 07/11/2018 Potassium 3.600 03/08/2018 ALT (SGPT) 15.000 IU/ 07/11/2018 TSH 2.470 12/22/2018  Allergies   Allergies  Allergen Reactions   Peanut-Containing Drug Products Itching   Shellfish Allergy Itching    Medications Prior to Visit:   Outpatient Medications Prior to Visit  Medication Sig Dispense Refill   Biotin 5000 MCG TABS Take 1 tablet by mouth daily.     cholecalciferol (VITAMIN D) 1000 units tablet Take 1,000 Units by mouth daily.     Cholecalciferol (VITAMIN D-3) 5000 UNIT/ML LIQD 1 capsule Orally Once a day     clobetasol ointment (TEMOVATE) 0.05 % Apply to skin in a thin layer twice day for 2 weeks for a flare then then twice a week at bedtime for a maintenance dose. 30 g 1   ELIQUIS 5 MG TABS tablet TAKE 1 TABLET BY MOUTH TWICE DAILY 180 tablet 3   furosemide (LASIX) 20 MG tablet TAKE 1 TABLET BY MOUTH DAILY AS NEEDED FOR LEG SWELLING 90 tablet 1   gabapentin (NEURONTIN) 100 MG capsule Take 100 mg by mouth 2 (two) times daily.      GEMTESA 75 MG TABS Take 1 tablet by mouth daily.     hydrochlorothiazide  (HYDRODIURIL) 12.5 MG tablet Take 1 tablet (12.5 mg total) by mouth daily. 90 tablet 3   levothyroxine (SYNTHROID, LEVOTHROID) 25 MCG tablet Take 25 mcg by mouth daily.  0   metoprolol succinate (TOPROL-XL) 25 MG 24 hr tablet Take 12.5 mg by mouth daily.      rosuvastatin (CRESTOR) 5 MG tablet Take 0.5 tablets (2.5 mg total) by mouth at bedtime. 90 tablet 1   traMADol (ULTRAM) 50 MG tablet TK 1 T PO Q 6 HOURS PRN     valACYclovir (VALTREX) 1000 MG tablet TK 1 T PO TID FOR 7 DAYS     bifidobacterium infantis (ALIGN) capsule Take by mouth. (Patient not taking: Reported on 05/05/2022)     meclizine (ANTIVERT) 25 MG tablet Take 12.5 mg by mouth 3 (three) times daily as needed for dizziness.     nystatin (MYCOSTATIN/NYSTOP) powder Apply 1 application topically 3 (three) times daily. Apply to undergarment or personal pad daily. 15 g 2   sertraline (ZOLOFT) 50 MG tablet Take 50 mg by mouth daily. (Patient not taking: Reported on 05/05/2022)     Facility-Administered Medications Prior to Visit  Medication Dose Route Frequency Provider Last Rate Last Admin   ceFAZolin (ANCEF) 2 g in dextrose 5 % 100 mL IVPB  2 g Intravenous Once Candiss Norse A, PA-C       Final Medications at End of Visit    Current Meds  Medication Sig   Biotin 5000 MCG TABS Take 1 tablet by mouth daily.   cholecalciferol (VITAMIN D) 1000 units tablet Take 1,000 Units by mouth daily.   Cholecalciferol (VITAMIN D-3) 5000 UNIT/ML LIQD 1 capsule Orally Once a day   clobetasol ointment (TEMOVATE) 0.05 % Apply to skin in a thin layer twice day for 2 weeks for a flare then then twice a week at bedtime for a maintenance dose.   ELIQUIS 5 MG TABS tablet TAKE 1 TABLET BY MOUTH TWICE DAILY   furosemide (LASIX) 20 MG tablet TAKE 1 TABLET BY MOUTH DAILY AS NEEDED FOR LEG SWELLING   gabapentin (NEURONTIN) 100 MG capsule Take 100 mg by mouth 2 (two) times daily.    GEMTESA 75 MG TABS Take 1 tablet by mouth daily.   hydrochlorothiazide  (HYDRODIURIL) 12.5 MG tablet Take 1 tablet (12.5 mg total) by mouth daily.   levothyroxine (SYNTHROID, LEVOTHROID) 25 MCG tablet Take 25 mcg by mouth daily.   metoprolol succinate (TOPROL-XL) 25 MG 24 hr tablet Take 12.5 mg by mouth daily.    rosuvastatin (CRESTOR) 5 MG tablet Take 0.5 tablets (2.5 mg total) by mouth at bedtime.   traMADol (ULTRAM) 50 MG tablet TK 1 T PO Q 6 HOURS PRN   valACYclovir (VALTREX) 1000 MG tablet TK 1 T PO TID FOR 7 DAYS   Radiology:  No results found.  04/18/2019: 1. No residual A-comm aneurysm sac filling, status post flow diverting stent placement and coiling. 2. Chronic ischemic microangiopathy and generalized volume loss. No acute abnormality.  Cardiac Studies:   PCV ECHOCARDIOGRAM COMPLETE 11/06/2020 Left ventricle cavity is normal in size. Moderate concentric hypertrophy of the left ventricle. Normal global wall motion. Normal LV systolic function with visual EF 50-55%. Unable to evaluate diastolic function due to atrial fibrillation. Left atrial cavity is mildly dilated. Trileaflet aortic valve. Mild annular calcification. No regurgitation. Mild mitral annular calcification. Mild mitral valve stenosis. Mean PG 2 mmHg, MVA 1.7 cm2 by PHT method at HR 62 bpm.  Trace regurgitation. Mild tricuspid regurgitation.  Estimated pulmonary artery systolic pressure 15 mmHg. No evidence of pulmonary hypertension.   Echo 12/17/2015 - at [facility] Moderate LVH, EF-60-65 percent.  Grade 1 diastolic dysfunction with elevated LV filling pressure.  Mitral annulus calcification.  Severely dilated LA, trace MR.  Status post endovascular obliteration of anterior communicating artery aneurysm with stent assisted coiling by Dr. Estanislado Pandy on 01/07/2017.  EKG:  05/05/22: Sinus rhythm with PACs, LVH  10/28/2021: Sinus rhythm with PACs at a rate of 73 bpm.  Left axis, left anterior fascicular block.  LVH with secondary repolarization abnormalities, cannot exclude lateral ischemia.   Compared EKG 04/30/2021, no significant change.  10/23/2020: Sinus rhythm at a rate of 63 bpm.  Left axis, left anterior fascicular block.  ST-T wave abnormality, cannot exclude lateral ischemia.  Compared to EKG 04/19/2020, no significant change in lateral ST-T abnormalities.   04/19/2020: Normal sinus rhythm at rate of 68 bpm, left atrial enlargement, left axis deviation, left anterior fascicular block.  LVH with repolarization abnormality, cannot exclude lateral ischemia.  Single PVC.  No significant change from 10/06/2019.  Assessment     ICD-10-CM   1. Primary hypertension  I10 EKG 12-Lead    2. Paroxysmal atrial fibrillation (HCC)  I48.0     3. Hypercholesteremia  E78.00       No orders of the defined types were placed in this encounter.   There are no discontinued medications.   This patients CHA2DS2-VASc Score 5 (HTN, Vasc, AGE, F) and yearly risk of stroke 7.2%.  Recommendations:   Citlaly P Ziebarth  is a 88 y.o. female  with hypertension, hyperlipidemia,  paroxysmal Afib, bilateral MCA and ACA multifocal punctate infarcts, after which she was started on anticaogulation on 12/18/15. She was also found to have cerebral aneurysm for which she underwent endovascular obliteration of anterior communicating artery aneurysm with stent assisted coiling by Dr. Estanislado Pandy on 01/07/17.  Continue current cardiac medications. Encourage low-sodium diet, less than 2000 mg daily. Follow-up in 6 months or sooner if needed.     Floydene Flock, DO, [facility] 05/05/2022, 11:42 AM Office: [phone]

## 2022-05-13 IMAGING — US US EXTREM LOW VENOUS
1 series · 13 of 24 positions shown · non-contrast
Comparison: None.

CLINICAL DATA: Swelling both lower extremities



[Series 1: us extrem low venous · 0.08mm/px · 13 of 47 slices shown]
[im 1/47]
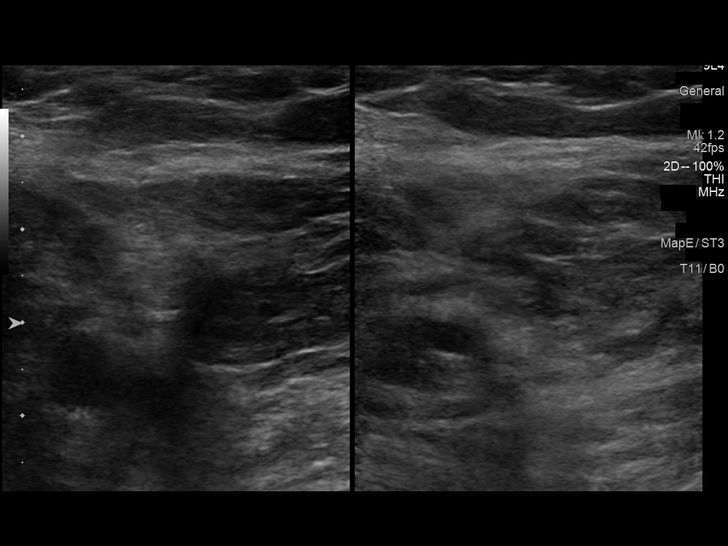
[im 5/47]
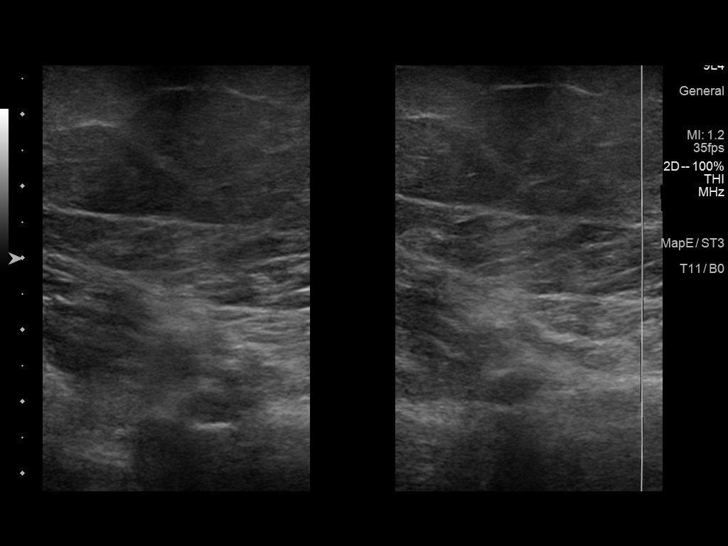
[im 9/47]
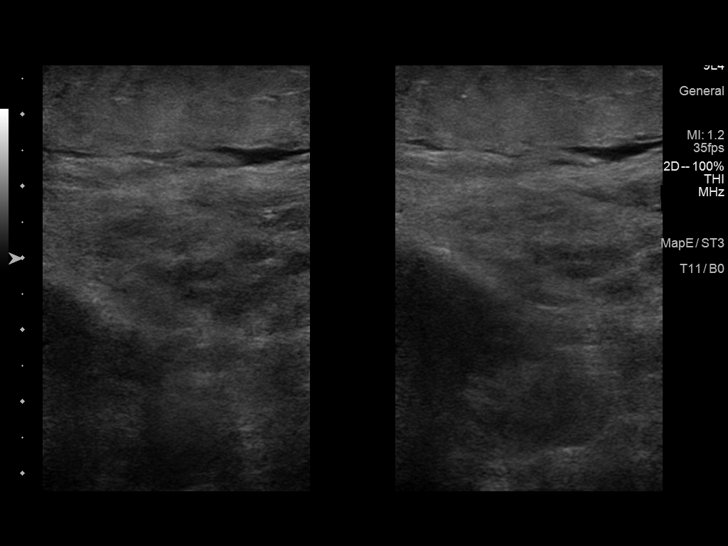
[im 13/47]
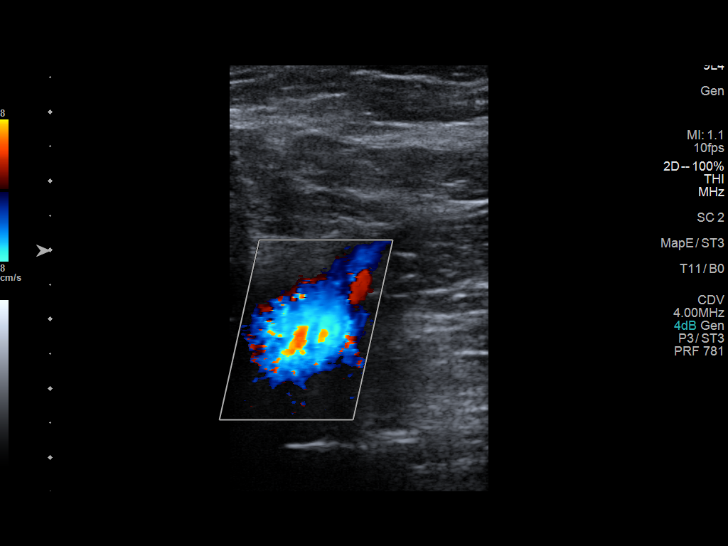
[im 17/47]
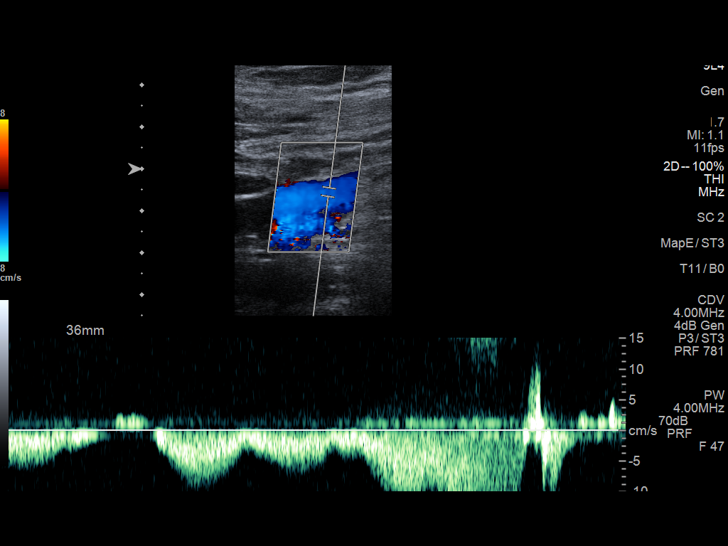
[im 21/47]
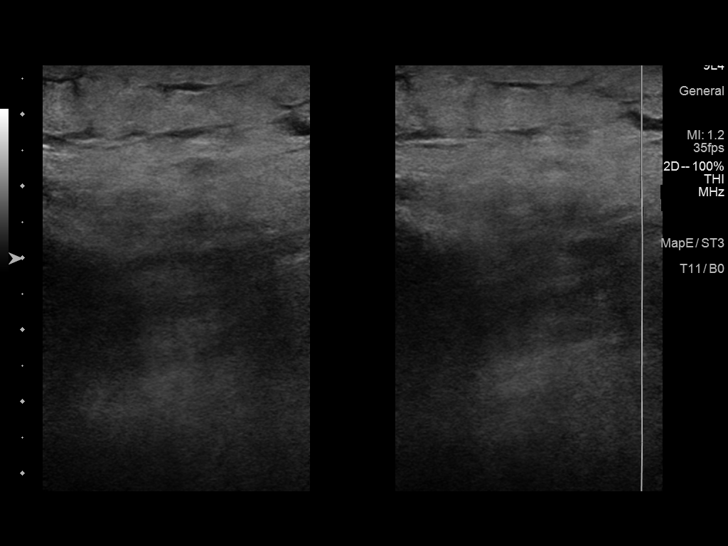
[im 25/47]
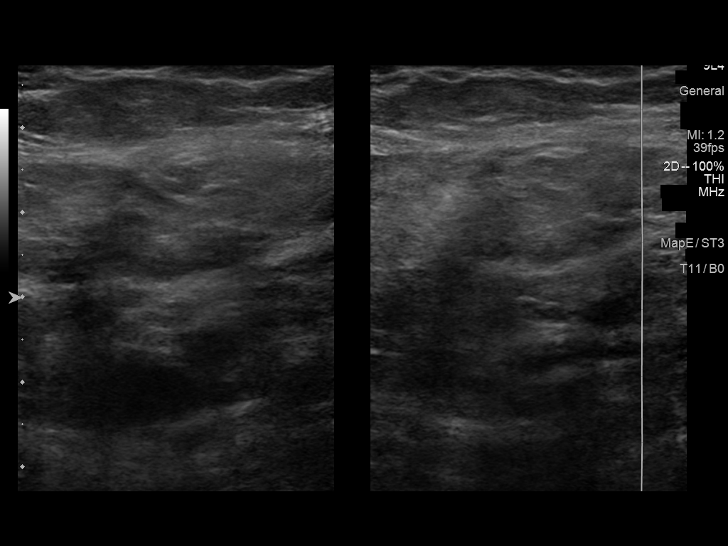
[im 27/47]
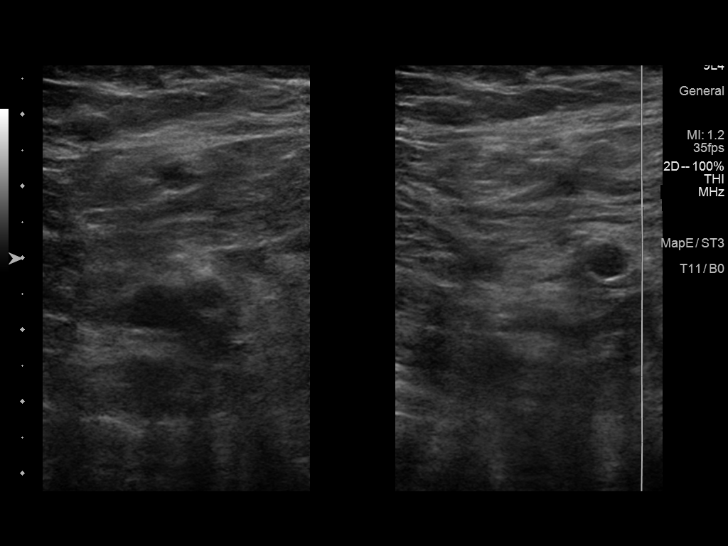
[im 31/47]
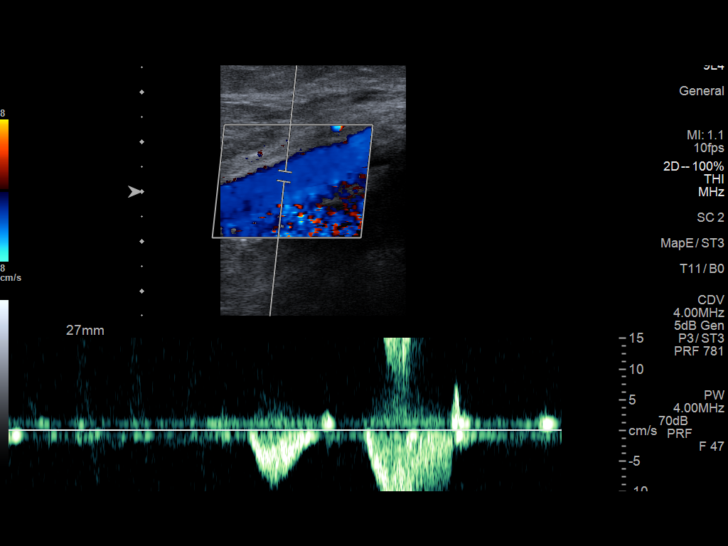
[im 35/47]
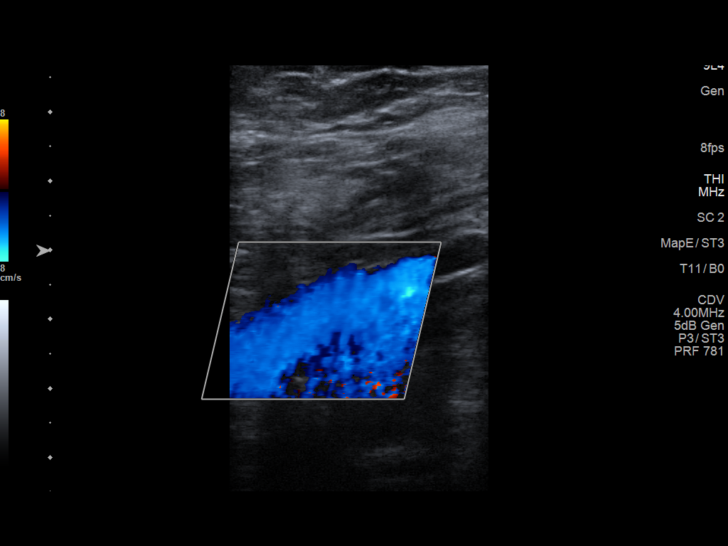
[im 39/47]
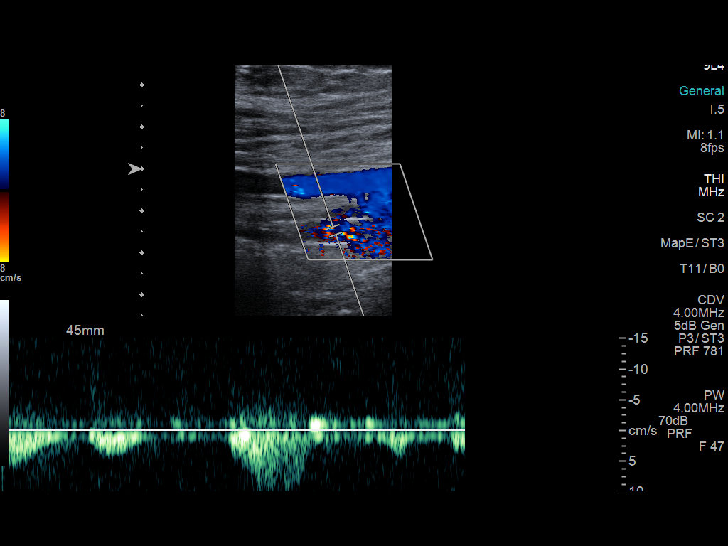
[im 43/47]
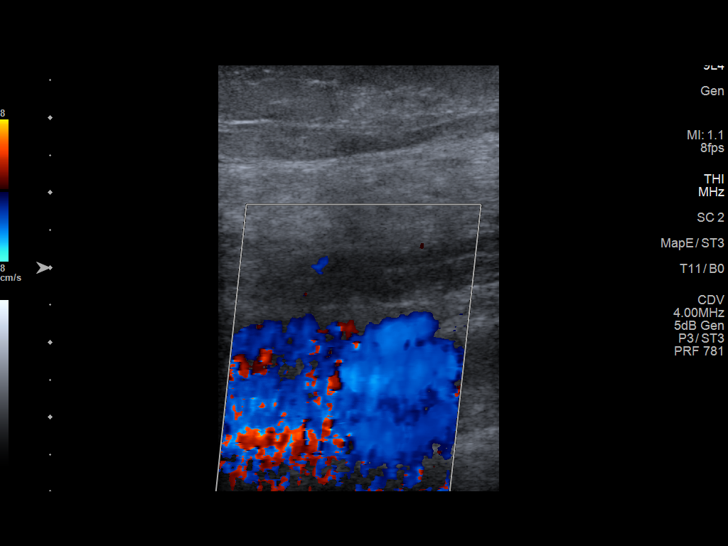
[im 47/47]
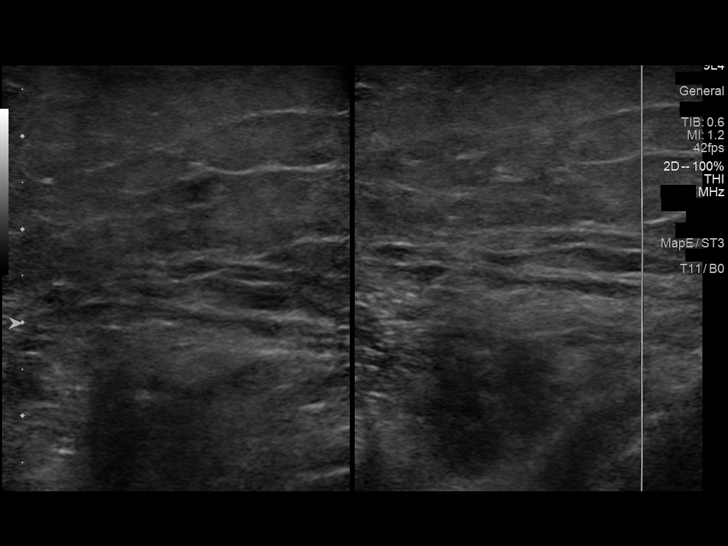

[13 of 24 positions shown; findings below may reference images not displayed]

FINDINGS: RIGHT LOWER EXTREMITY

Common Femoral Vein: No evidence of thrombus. Normal
compressibility, respiratory phasicity and response to augmentation.

Saphenofemoral Junction: No evidence of thrombus. Normal
compressibility and flow on color Doppler imaging.

Profunda Femoral Vein: No evidence of thrombus. Normal
compressibility and flow on color Doppler imaging.

Femoral Vein: No evidence of thrombus. Normal compressibility,
respiratory phasicity and response to augmentation.

Popliteal Vein: No evidence of thrombus. Normal compressibility,
respiratory phasicity and response to augmentation.

Calf Veins: No evidence of thrombus. Normal compressibility and flow
on color Doppler imaging.

Superficial Great Saphenous Vein: No evidence of thrombus. Normal
compressibility.

Venous Reflux:  None.

Other Findings: There is edema in the subcutaneous plane. There are
slightly enlarged lymph nodes with fatty hilum in both inguinal
regions. This finding most likely suggests benign reactive
hyperplasia.

LEFT LOWER EXTREMITY

Common Femoral Vein: No evidence of thrombus. Normal
compressibility, respiratory phasicity and response to augmentation.

Saphenofemoral Junction: No evidence of thrombus. Normal
compressibility and flow on color Doppler imaging.

Profunda Femoral Vein: No evidence of thrombus. Normal
compressibility and flow on color Doppler imaging.

Femoral Vein: No evidence of thrombus. Normal compressibility,
respiratory phasicity and response to augmentation.

Popliteal Vein: No evidence of thrombus. Normal compressibility,
respiratory phasicity and response to augmentation.

Calf Veins: No evidence of thrombus. Normal compressibility and flow
on color Doppler imaging.

Superficial Great Saphenous Vein: No evidence of thrombus. Normal
compressibility.

Venous Reflux:  None.

Other Findings:  None.
IMPRESSION: No evidence of deep venous thrombosis in either lower extremity.

## 2022-05-14 DIAGNOSIS — N3946 Mixed incontinence: Secondary | ICD-10-CM | POA: Diagnosis not present

## 2022-05-19 NOTE — Progress Notes (Signed)
Appointment was cancelled. Patient was not seen or examined.

## 2022-05-25 DIAGNOSIS — M810 Age-related osteoporosis without current pathological fracture: Secondary | ICD-10-CM | POA: Diagnosis not present

## 2022-06-12 DIAGNOSIS — R350 Frequency of micturition: Secondary | ICD-10-CM | POA: Diagnosis not present

## 2022-06-16 DIAGNOSIS — E559 Vitamin D deficiency, unspecified: Secondary | ICD-10-CM | POA: Diagnosis not present

## 2022-06-16 DIAGNOSIS — E039 Hypothyroidism, unspecified: Secondary | ICD-10-CM | POA: Diagnosis not present

## 2022-06-16 DIAGNOSIS — I10 Essential (primary) hypertension: Secondary | ICD-10-CM | POA: Diagnosis not present

## 2022-06-16 DIAGNOSIS — E7800 Pure hypercholesterolemia, unspecified: Secondary | ICD-10-CM | POA: Diagnosis not present

## 2022-06-16 DIAGNOSIS — Z7901 Long term (current) use of anticoagulants: Secondary | ICD-10-CM | POA: Diagnosis not present

## 2022-06-16 DIAGNOSIS — Z8673 Personal history of transient ischemic attack (TIA), and cerebral infarction without residual deficits: Secondary | ICD-10-CM | POA: Diagnosis not present

## 2022-06-24 DIAGNOSIS — E7800 Pure hypercholesterolemia, unspecified: Secondary | ICD-10-CM | POA: Diagnosis not present

## 2022-06-24 DIAGNOSIS — I10 Essential (primary) hypertension: Secondary | ICD-10-CM | POA: Diagnosis not present

## 2022-06-24 DIAGNOSIS — Z23 Encounter for immunization: Secondary | ICD-10-CM | POA: Diagnosis not present

## 2022-06-24 DIAGNOSIS — E559 Vitamin D deficiency, unspecified: Secondary | ICD-10-CM | POA: Diagnosis not present

## 2022-06-24 DIAGNOSIS — I4891 Unspecified atrial fibrillation: Secondary | ICD-10-CM | POA: Diagnosis not present

## 2022-06-24 DIAGNOSIS — M545 Low back pain, unspecified: Secondary | ICD-10-CM | POA: Diagnosis not present

## 2022-06-29 ENCOUNTER — Other Ambulatory Visit: Payer: Self-pay | Admitting: Internal Medicine

## 2022-07-09 DIAGNOSIS — R350 Frequency of micturition: Secondary | ICD-10-CM | POA: Diagnosis not present

## 2022-07-14 ENCOUNTER — Ambulatory Visit: Payer: Medicare Other | Admitting: Internal Medicine

## 2022-07-14 ENCOUNTER — Encounter: Payer: Self-pay | Admitting: Internal Medicine

## 2022-07-14 VITALS — BP 148/66 | HR 66 | Ht 60.0 in | Wt 177.0 lb

## 2022-07-14 DIAGNOSIS — I10 Essential (primary) hypertension: Secondary | ICD-10-CM | POA: Diagnosis not present

## 2022-07-14 DIAGNOSIS — R001 Bradycardia, unspecified: Secondary | ICD-10-CM

## 2022-07-14 DIAGNOSIS — I480 Paroxysmal atrial fibrillation: Secondary | ICD-10-CM

## 2022-07-14 MED ORDER — LISINOPRIL 10 MG PO TABS: 10.0000 mg | ORAL_TABLET | Freq: Every day | ORAL | 3 refills | Status: DC

## 2022-07-14 NOTE — Progress Notes (Signed)
Primary Physician/Referring:  Deland Pretty, MD  Patient ID: Sara Frye, female    DOB: [birth year], 88 y.o.   MRN: [id number]  Chief Complaint  Patient presents with   Bradycardia   Follow-up     HPI: Sara Frye  is a 88 y.o. female  with hypertension, hyperlipidemia,  paroxysmal Afib, bilateral MCA and ACA multifocal punctate infarcts, after which she was started on anticaogulation on 12/18/15. She was also found to have cerebral aneurysm for which she underwent endovascular obliteration of anterior communicating artery aneurysm with stent assisted coiling by Dr. Estanislado Pandy on 01/07/17. She continues to follow closely with Dr.Deveshwar for management of cerebral aneurysm with stenting.  Patient presents for acute visit due to heart rate dropping low into the 40s. She called EMS this morning but did not want to go to the hospital since she had an appointment here. Her pulse rose to 75 bpm on its own and her BP was stable the entire time. She is also getting dizzy and light-headed which is most likely from the beta-blockers. Patient is agreeable to stopping beta-blocker and taking a different medicine to control her BP. She denies chest pain, shortness of breath, palpitations, diaphoresis, syncope, claudication, edema.     Past Medical History:  Diagnosis Date   Arthritis    spine   Atrial fibrillation ([place])    Brain aneurysm 01/07/2017   Dysrhythmia    afib   H/O cerebral aneurysm repair ACA S/P Coiling by Dr. Estanislado Pandy 01/07/17 01/07/2017   Headache    with Eliquis   Hypertension    Hypothyroidism    Maternal blood transfusion    Neuromuscular disorder ([place])    spinal injury- "I'm unsure of walking, I'm very careful"   Shingles    Stroke ([place]) 11/2015   Family History  Problem Relation Age of Onset   Cancer Mother    Breast cancer Sister    Heart failure Brother        deceased age 42   Cancer Brother    Cancer Brother    Cancer Sister    Past Surgical  History:  Procedure Laterality Date   ABDOMINAL HYSTERECTOMY     ovaries removed   APPENDECTOMY     CARPAL TUNNEL RELEASE Bilateral    CATARACT EXTRACTION     CHOLECYSTECTOMY     IR ANGIO INTRA EXTRACRAN SEL COM CAROTID INNOMINATE BILAT MOD SED  12/30/2016   IR ANGIO INTRA EXTRACRAN SEL COM CAROTID INNOMINATE BILAT MOD SED  03/08/2018   IR ANGIO INTRA EXTRACRAN SEL INTERNAL CAROTID UNI R MOD SED  01/07/2017   IR ANGIO VERTEBRAL SEL SUBCLAVIAN INNOMINATE UNI R MOD SED  12/30/2016   IR ANGIO VERTEBRAL SEL VERTEBRAL BILAT MOD SED  03/08/2018   IR ANGIO VERTEBRAL SEL VERTEBRAL UNI L MOD SED  12/30/2016   IR ANGIOGRAM FOLLOW UP STUDY  01/07/2017   IR ANGIOGRAM FOLLOW UP STUDY  01/07/2017   IR ANGIOGRAM FOLLOW UP STUDY  01/07/2017   IR NEURO EACH ADD'L AFTER BASIC UNI RIGHT (MS)  01/07/2017   IR RADIOLOGIST EVAL & MGMT  11/24/2016   IR RADIOLOGIST EVAL & MGMT  01/25/2017   IR TRANSCATH/EMBOLIZ  01/07/2017   JOINT REPLACEMENT Bilateral    arthroscopic - knees   RADIOLOGY WITH ANESTHESIA N/A 12/30/2016   Procedure: RADIOLOGY WITH ANESTHESIA  EMBOLIZATION;  Surgeon: Luanne Bras, MD;  Location: [facility];  Service: Radiology;  Laterality: N/A;   RADIOLOGY WITH ANESTHESIA N/A 01/07/2017  Procedure: EMBOLIZATION;  Surgeon: Luanne Bras, MD;  Location: [facility];  Service: Radiology;  Laterality: N/A;   Social History   Tobacco Use   Smoking status: Never   Smokeless tobacco: Never  Substance Use Topics   Alcohol use: No     ROS   Review of Systems  Constitutional: Negative for malaise/fatigue.  Cardiovascular:  Negative for chest pain, claudication, dyspnea on exertion, leg swelling, near-syncope, orthopnea, palpitations, paroxysmal nocturnal dyspnea and syncope.  Neurological:  Positive for dizziness and light-headedness.  All other systems reviewed and are negative.   Objective  Blood pressure (!) 148/66, pulse 66, height 5' (1.524 m), weight 177 lb (80.3 kg), SpO2 97 %.  Body mass index is 34.57 kg/m.    07/14/2022    1:57 PM 05/05/2022   11:16 AM 10/28/2021   11:05 AM  Vitals with BMI  Height '5\' 0"'$  '5\' 0"'$  '5\' 0"'$   Weight 177 lbs 180 lbs 176 lbs 10 oz  BMI 34.57 32.99 24.26  Systolic 834 196 222  Diastolic 66 83 71  Pulse 66 84 84   Physical Exam Vitals reviewed.  Constitutional:      Appearance: She is obese.     Comments: Moderately obese  HENT:     Head: Normocephalic and atraumatic.  Neck:     Vascular: No JVD.  Cardiovascular:     Rate and Rhythm: Normal rate and regular rhythm.     Pulses:          Carotid pulses are 2+ on the right side and 2+ on the left side.      Femoral pulses are 2+ on the right side and 2+ on the left side.      Popliteal pulses are 1+ on the right side and 1+ on the left side.       Dorsalis pedis pulses are 1+ on the right side and 1+ on the left side.       Posterior tibial pulses are 0 on the right side and 0 on the left side.     Heart sounds: Murmur heard.     Harsh midsystolic murmur is present with a grade of 2/6 at the upper right sternal border.     No gallop.  Pulmonary:     Effort: Pulmonary effort is normal.     Breath sounds: Normal breath sounds.  Abdominal:     Comments: Obese  Musculoskeletal:     Right lower leg: Edema (trace) present.     Left lower leg: Edema (trace) present.  Skin:    General: Skin is warm and dry.    Laboratory examination:      Latest Ref Rng & Units 10/09/2019    8:12 AM 04/18/2019    1:25 PM 03/08/2018    7:10 AM  CMP  Glucose 65 - 99 mg/dL 85   100   BUN 8 - 27 mg/dL 18   10   Creatinine 0.57 - 1.00 mg/dL 0.84  0.83  0.83   Sodium 134 - 144 mmol/L 144   140   Potassium 3.5 - 5.2 mmol/L 5.5   3.6   Chloride 96 - 106 mmol/L 106   105   CO2 20 - 29 mmol/L 25   26   Calcium 8.7 - 10.3 mg/dL 10.1   9.4   Total Protein 6.0 - 8.5 g/dL 7.2     Total Bilirubin 0.0 - 1.2 mg/dL 0.3     Alkaline Phos 39 - 117 IU/L 61  AST 0 - 40 IU/L 17     ALT 0 - 32 IU/L 15          Latest Ref Rng & Units 10/09/2019    8:12 AM 03/08/2018    7:10 AM 01/08/2017    7:05 AM  CBC  WBC 3.4 - 10.8 x10E3/uL 5.7  6.3  8.8   Hemoglobin 11.1 - 15.9 g/dL 12.2  13.7  11.4   Hematocrit 34.0 - 46.6 % 36.6  40.9  32.4   Platelets 150 - 450 x10E3/uL 262  243  197    Lipid Panel     Component Value Date/Time   CHOL 136 10/09/2019 0812   TRIG 75 10/09/2019 0812   HDL 59 10/09/2019 0812   CHOLHDL 3.0 12/17/2015 0436   VLDL 24 12/17/2015 0436   LDLCALC 62 10/09/2019 0812   HEMOGLOBIN A1C Lab Results  Component Value Date   HGBA1C 6.1 (H) 12/17/2015   MPG 128 12/17/2015   TSH No results for input(s): "TSH" in the last 8760 hours.   External labs : 01/01/2021: Total cholesterol 144, HDL 56, LDL 60, triglycerides 93  12/27/2019:  HDL 57, LDL 60, total cholesterol 136, triglycerides 114 BUN 18, Cr 0.84 TSH 3.62  07/11/2018 Cholesterol, total 133.000 12/15/2017 HDL 61.000 12/15/2017 LDL 57.000 12/15/2017 Triglycerides 111.000 12/22/2018  A1C N/D Hemoglobin 13.700 G/ 07/11/2018, INR 1.080 03/08/2018 Platelets 280.000 X 07/11/2018  Creatinine, Serum 0.840 MG/ 07/11/2018 Potassium 3.600 03/08/2018 ALT (SGPT) 15.000 IU/ 07/11/2018 TSH 2.470 12/22/2018  Allergies   Allergies  Allergen Reactions   Peanut-Containing Drug Products Itching   Shellfish Allergy Itching    Medications Prior to Visit:   Outpatient Medications Prior to Visit  Medication Sig Dispense Refill   bifidobacterium infantis (ALIGN) capsule Take by mouth.     Biotin 5000 MCG TABS Take 1 tablet by mouth daily.     cholecalciferol (VITAMIN D) 1000 units tablet Take 1,000 Units by mouth daily.     Cholecalciferol (VITAMIN D-3) 5000 UNIT/ML LIQD 1 capsule Orally Once a day     ELIQUIS 5 MG TABS tablet TAKE 1 TABLET BY MOUTH TWICE DAILY 180 tablet 3   furosemide (LASIX) 20 MG tablet TAKE 1 TABLET BY MOUTH DAILY AS NEEDED FOR LEG SWELLING (Patient taking differently: Take 20 mg by mouth daily.) 90  tablet 1   gabapentin (NEURONTIN) 100 MG capsule Take 100 mg by mouth 2 (two) times daily.      GEMTESA 75 MG TABS Take 1 tablet by mouth daily.     hydrochlorothiazide (HYDRODIURIL) 12.5 MG tablet Take 1 tablet (12.5 mg total) by mouth daily. 90 tablet 3   levothyroxine (SYNTHROID, LEVOTHROID) 25 MCG tablet Take 25 mcg by mouth daily.  0   meclizine (ANTIVERT) 25 MG tablet Take 12.5 mg by mouth 3 (three) times daily as needed for dizziness.     nystatin (MYCOSTATIN/NYSTOP) powder Apply 1 application topically 3 (three) times daily. Apply to undergarment or personal pad daily. 15 g 2   rosuvastatin (CRESTOR) 5 MG tablet Take 0.5 tablets (2.5 mg total) by mouth at bedtime. 90 tablet 1   traMADol (ULTRAM) 50 MG tablet TK 1 T PO Q 6 HOURS PRN     valACYclovir (VALTREX) 1000 MG tablet TK 1 T PO TID FOR 7 DAYS     metoprolol succinate (TOPROL-XL) 25 MG 24 hr tablet Take 12.5 mg by mouth daily.      clobetasol ointment (TEMOVATE) 0.05 % Apply to skin in a thin  layer twice day for 2 weeks for a flare then then twice a week at bedtime for a maintenance dose. 30 g 1   sertraline (ZOLOFT) 50 MG tablet Take 50 mg by mouth daily. (Patient not taking: Reported on 05/05/2022)     Facility-Administered Medications Prior to Visit  Medication Dose Route Frequency Provider Last Rate Last Admin   ceFAZolin (ANCEF) 2 g in dextrose 5 % 100 mL IVPB  2 g Intravenous Once Candiss Norse A, PA-C       Final Medications at End of Visit    Current Meds  Medication Sig   bifidobacterium infantis (ALIGN) capsule Take by mouth.   Biotin 5000 MCG TABS Take 1 tablet by mouth daily.   cholecalciferol (VITAMIN D) 1000 units tablet Take 1,000 Units by mouth daily.   Cholecalciferol (VITAMIN D-3) 5000 UNIT/ML LIQD 1 capsule Orally Once a day   ELIQUIS 5 MG TABS tablet TAKE 1 TABLET BY MOUTH TWICE DAILY   furosemide (LASIX) 20 MG tablet TAKE 1 TABLET BY MOUTH DAILY AS NEEDED FOR LEG SWELLING (Patient taking differently:  Take 20 mg by mouth daily.)   gabapentin (NEURONTIN) 100 MG capsule Take 100 mg by mouth 2 (two) times daily.    GEMTESA 75 MG TABS Take 1 tablet by mouth daily.   hydrochlorothiazide (HYDRODIURIL) 12.5 MG tablet Take 1 tablet (12.5 mg total) by mouth daily.   levothyroxine (SYNTHROID, LEVOTHROID) 25 MCG tablet Take 25 mcg by mouth daily.   lisinopril (ZESTRIL) 10 MG tablet Take 1 tablet (10 mg total) by mouth daily.   meclizine (ANTIVERT) 25 MG tablet Take 12.5 mg by mouth 3 (three) times daily as needed for dizziness.   nystatin (MYCOSTATIN/NYSTOP) powder Apply 1 application topically 3 (three) times daily. Apply to undergarment or personal pad daily.   rosuvastatin (CRESTOR) 5 MG tablet Take 0.5 tablets (2.5 mg total) by mouth at bedtime.   traMADol (ULTRAM) 50 MG tablet TK 1 T PO Q 6 HOURS PRN   valACYclovir (VALTREX) 1000 MG tablet TK 1 T PO TID FOR 7 DAYS   [DISCONTINUED] metoprolol succinate (TOPROL-XL) 25 MG 24 hr tablet Take 12.5 mg by mouth daily.    Radiology:  No results found.  04/18/2019: 1. No residual A-comm aneurysm sac filling, status post flow diverting stent placement and coiling. 2. Chronic ischemic microangiopathy and generalized volume loss. No acute abnormality.  Cardiac Studies:   PCV ECHOCARDIOGRAM COMPLETE 11/06/2020 Left ventricle cavity is normal in size. Moderate concentric hypertrophy of the left ventricle. Normal global wall motion. Normal LV systolic function with visual EF 50-55%. Unable to evaluate diastolic function due to atrial fibrillation. Left atrial cavity is mildly dilated. Trileaflet aortic valve. Mild annular calcification. No regurgitation. Mild mitral annular calcification. Mild mitral valve stenosis. Mean PG 2 mmHg, MVA 1.7 cm2 by PHT method at HR 62 bpm.  Trace regurgitation. Mild tricuspid regurgitation.  Estimated pulmonary artery systolic pressure 15 mmHg. No evidence of pulmonary hypertension.   Echo 12/17/2015 - at [facility]-  Moderate LVH, EF-60-65 percent.  Grade 1 diastolic dysfunction with elevated LV filling pressure.  Mitral annulus calcification.  Severely dilated LA, trace MR.  Status post endovascular obliteration of anterior communicating artery aneurysm with stent assisted coiling by Dr. Estanislado Pandy on 01/07/2017.  EKG:  05/05/22: Sinus rhythm with PACs, LVH  10/28/2021: Sinus rhythm with PACs at a rate of 73 bpm.  Left axis, left anterior fascicular block.  LVH with secondary repolarization abnormalities, cannot exclude lateral ischemia.  Compared  EKG 04/30/2021, no significant change.  10/23/2020: Sinus rhythm at a rate of 63 bpm.  Left axis, left anterior fascicular block.  ST-T wave abnormality, cannot exclude lateral ischemia.  Compared to EKG 04/19/2020, no significant change in lateral ST-T abnormalities.   04/19/2020: Normal sinus rhythm at rate of 68 bpm, left atrial enlargement, left axis deviation, left anterior fascicular block.  LVH with repolarization abnormality, cannot exclude lateral ischemia.  Single PVC.  No significant change from 10/06/2019.  Assessment     ICD-10-CM   1. Bradycardia  R00.1 EKG 12-Lead    2. Essential hypertension  I10     3. Paroxysmal atrial fibrillation (HCC)  I48.0       Meds ordered this encounter  Medications   lisinopril (ZESTRIL) 10 MG tablet    Sig: Take 1 tablet (10 mg total) by mouth daily.    Dispense:  90 tablet    Refill:  3    Medications Discontinued During This Encounter  Medication Reason   metoprolol succinate (TOPROL-XL) 25 MG 24 hr tablet      This patients CHA2DS2-VASc Score 5 (HTN, Vasc, AGE, F) and yearly risk of stroke 7.2%.  Recommendations:   Sara Frye  is a 88 y.o. female  with hypertension, hyperlipidemia,  paroxysmal Afib, bilateral MCA and ACA multifocal punctate infarcts, after which she was started on anticaogulation on 12/18/15. She was also found to have cerebral aneurysm for which she underwent endovascular  obliteration of anterior communicating artery aneurysm with stent assisted coiling by Dr. Estanislado Pandy on 01/07/17.   Bradycardia Stop Toprol-XL   Essential hypertension Continue current cardiac medications but stop Toprol. Adding lisinopril '10mg'$  for BP control Encourage low-sodium diet, less than 2000 mg daily.  Paroxysmal atrial fibrillation (HCC) Continue taking Eliquis  Follow-up in 6 months or sooner if needed.     Floydene Flock, DO, [facility] 07/14/2022, 2:33 PM Office: [phone]

## 2022-07-20 ENCOUNTER — Inpatient Hospital Stay (HOSPITAL_COMMUNITY)
Admission: EM | Admit: 2022-07-20 | Discharge: 2022-07-23 | DRG: 065 | Disposition: A | Discharge status: Home / Self Care | Payer: Medicare Other | Attending: Internal Medicine | Admitting: Internal Medicine

## 2022-07-20 ENCOUNTER — Emergency Department (HOSPITAL_COMMUNITY): Payer: Medicare Other

## 2022-07-20 DIAGNOSIS — R519 Headache, unspecified: Secondary | ICD-10-CM | POA: Diagnosis not present

## 2022-07-20 DIAGNOSIS — Z7901 Long term (current) use of anticoagulants: Secondary | ICD-10-CM

## 2022-07-20 DIAGNOSIS — I63541 Cerebral infarction due to unspecified occlusion or stenosis of right cerebellar artery: Secondary | ICD-10-CM | POA: Diagnosis present

## 2022-07-20 DIAGNOSIS — Z9889 Other specified postprocedural states: Secondary | ICD-10-CM | POA: Diagnosis not present

## 2022-07-20 DIAGNOSIS — M549 Dorsalgia, unspecified: Secondary | ICD-10-CM | POA: Diagnosis present

## 2022-07-20 DIAGNOSIS — R109 Unspecified abdominal pain: Secondary | ICD-10-CM | POA: Diagnosis present

## 2022-07-20 DIAGNOSIS — G459 Transient cerebral ischemic attack, unspecified: Secondary | ICD-10-CM | POA: Diagnosis not present

## 2022-07-20 DIAGNOSIS — Z8679 Personal history of other diseases of the circulatory system: Secondary | ICD-10-CM | POA: Diagnosis not present

## 2022-07-20 DIAGNOSIS — I4891 Unspecified atrial fibrillation: Secondary | ICD-10-CM | POA: Diagnosis present

## 2022-07-20 DIAGNOSIS — I639 Cerebral infarction, unspecified: Secondary | ICD-10-CM | POA: Diagnosis not present

## 2022-07-20 DIAGNOSIS — D649 Anemia, unspecified: Secondary | ICD-10-CM | POA: Diagnosis present

## 2022-07-20 DIAGNOSIS — R29701 NIHSS score 1: Secondary | ICD-10-CM | POA: Diagnosis present

## 2022-07-20 DIAGNOSIS — Z8249 Family history of ischemic heart disease and other diseases of the circulatory system: Secondary | ICD-10-CM

## 2022-07-20 DIAGNOSIS — G8929 Other chronic pain: Secondary | ICD-10-CM | POA: Diagnosis present

## 2022-07-20 DIAGNOSIS — R4701 Aphasia: Secondary | ICD-10-CM | POA: Diagnosis present

## 2022-07-20 DIAGNOSIS — G8194 Hemiplegia, unspecified affecting left nondominant side: Secondary | ICD-10-CM | POA: Diagnosis present

## 2022-07-20 DIAGNOSIS — E039 Hypothyroidism, unspecified: Secondary | ICD-10-CM | POA: Diagnosis present

## 2022-07-20 DIAGNOSIS — E7800 Pure hypercholesterolemia, unspecified: Secondary | ICD-10-CM | POA: Diagnosis present

## 2022-07-20 DIAGNOSIS — Z6834 Body mass index (BMI) 34.0-34.9, adult: Secondary | ICD-10-CM | POA: Diagnosis not present

## 2022-07-20 DIAGNOSIS — E669 Obesity, unspecified: Secondary | ICD-10-CM | POA: Diagnosis present

## 2022-07-20 DIAGNOSIS — I959 Hypotension, unspecified: Secondary | ICD-10-CM | POA: Diagnosis not present

## 2022-07-20 DIAGNOSIS — I6389 Other cerebral infarction: Secondary | ICD-10-CM | POA: Diagnosis not present

## 2022-07-20 DIAGNOSIS — Z91010 Allergy to peanuts: Secondary | ICD-10-CM | POA: Diagnosis not present

## 2022-07-20 DIAGNOSIS — G319 Degenerative disease of nervous system, unspecified: Secondary | ICD-10-CM | POA: Diagnosis not present

## 2022-07-20 DIAGNOSIS — R4781 Slurred speech: Secondary | ICD-10-CM | POA: Diagnosis not present

## 2022-07-20 DIAGNOSIS — I10 Essential (primary) hypertension: Secondary | ICD-10-CM | POA: Diagnosis present

## 2022-07-20 DIAGNOSIS — Z8673 Personal history of transient ischemic attack (TIA), and cerebral infarction without residual deficits: Secondary | ICD-10-CM | POA: Diagnosis not present

## 2022-07-20 DIAGNOSIS — I480 Paroxysmal atrial fibrillation: Secondary | ICD-10-CM | POA: Diagnosis present

## 2022-07-20 DIAGNOSIS — G4489 Other headache syndrome: Secondary | ICD-10-CM | POA: Diagnosis not present

## 2022-07-20 DIAGNOSIS — Z79899 Other long term (current) drug therapy: Secondary | ICD-10-CM | POA: Diagnosis not present

## 2022-07-20 DIAGNOSIS — Z91013 Allergy to seafood: Secondary | ICD-10-CM | POA: Diagnosis not present

## 2022-07-20 DIAGNOSIS — Z79890 Hormone replacement therapy (postmenopausal): Secondary | ICD-10-CM | POA: Diagnosis not present

## 2022-07-20 DIAGNOSIS — I63342 Cerebral infarction due to thrombosis of left cerebellar artery: Secondary | ICD-10-CM | POA: Diagnosis not present

## 2022-07-20 DIAGNOSIS — I6523 Occlusion and stenosis of bilateral carotid arteries: Secondary | ICD-10-CM | POA: Diagnosis not present

## 2022-07-20 DIAGNOSIS — I6310 Cerebral infarction due to embolism of unspecified precerebral artery: Secondary | ICD-10-CM

## 2022-07-20 DIAGNOSIS — E785 Hyperlipidemia, unspecified: Secondary | ICD-10-CM | POA: Diagnosis not present

## 2022-07-20 DIAGNOSIS — I63211 Cerebral infarction due to unspecified occlusion or stenosis of right vertebral arteries: Secondary | ICD-10-CM | POA: Diagnosis not present

## 2022-07-20 LAB — COMPREHENSIVE METABOLIC PANEL
ALT: 18 U/L (ref 0–44)
AST: 28 U/L (ref 15–41)
Albumin: 3.8 g/dL (ref 3.5–5.0)
Alkaline Phosphatase: 47 U/L (ref 38–126)
Anion gap: 11 (ref 5–15)
BUN: 7 mg/dL — ABNORMAL LOW (ref 8–23)
CO2: 23 mmol/L (ref 22–32)
Calcium: 9 mg/dL (ref 8.9–10.3)
Chloride: 105 mmol/L (ref 98–111)
Creatinine, Ser: 0.84 mg/dL (ref 0.44–1.00)
GFR, Estimated: >60 mL/min (ref >60)
Glucose, Bld: 83 mg/dL (ref 70–99)
Potassium: 3.7 mmol/L (ref 3.5–5.1)
Sodium: 139 mmol/L (ref 135–145)
Total Bilirubin: 0.4 mg/dL (ref 0.3–1.2)
Total Protein: 6.8 g/dL (ref 6.5–8.1)

## 2022-07-20 LAB — PROTIME-INR
INR: 1.3 — ABNORMAL HIGH (ref 0.8–1.2)
Prothrombin Time: 16 seconds — ABNORMAL HIGH (ref 11.4–15.2)

## 2022-07-20 LAB — LIPID PANEL
Cholesterol: 156 mg/dL (ref 0–200)
HDL: 41 mg/dL (ref >40)
LDL Cholesterol: 84 mg/dL (ref 0–99)
Total CHOL/HDL Ratio: 3.8 RATIO
Triglycerides: 153 mg/dL — ABNORMAL HIGH (ref <150)
VLDL: 31 mg/dL (ref 0–40)

## 2022-07-20 LAB — CBC WITH DIFFERENTIAL/PLATELET
Abs Immature Granulocytes: 0.01 10*3/uL (ref 0.00–0.07)
Basophils Absolute: 0 10*3/uL (ref 0.0–0.1)
Basophils Relative: 0 %
Eosinophils Absolute: 0.2 10*3/uL (ref 0.0–0.5)
Eosinophils Relative: 3 %
HCT: 36.5 % (ref 36.0–46.0)
Hemoglobin: 12.1 g/dL (ref 12.0–15.0)
Immature Granulocytes: 0 %
Lymphocytes Relative: 31 %
Lymphs Abs: 1.8 10*3/uL (ref 0.7–4.0)
MCH: 28.7 pg (ref 26.0–34.0)
MCHC: 33.2 g/dL (ref 30.0–36.0)
MCV: 86.5 fL (ref 80.0–100.0)
Monocytes Absolute: 0.5 10*3/uL (ref 0.1–1.0)
Monocytes Relative: 8 %
Neutro Abs: 3.4 10*3/uL (ref 1.7–7.7)
Neutrophils Relative %: 58 %
Platelets: 245 10*3/uL (ref 150–400)
RBC: 4.22 MIL/uL (ref 3.87–5.11)
RDW: 14.2 % (ref 11.5–15.5)
WBC: 5.9 10*3/uL (ref 4.0–10.5)
nRBC: 0 % (ref 0.0–0.2)

## 2022-07-20 LAB — APTT: aPTT: 32 seconds (ref 24–36)

## 2022-07-20 LAB — TSH: TSH: 1.715 u[IU]/mL (ref 0.350–4.500)

## 2022-07-20 MED ORDER — SERTRALINE HCL 50 MG PO TABS
50.0000 mg | ORAL_TABLET | Freq: Every day | ORAL | Status: DC
  Administered 2022-07-21 – 2022-07-23 (×3): 50 mg via ORAL
  Filled 2022-07-20 (×3): qty 1

## 2022-07-20 MED ORDER — GABAPENTIN 100 MG PO CAPS: 100.0000 mg | ORAL_CAPSULE | Freq: Two times a day (BID) | ORAL | Status: DC

## 2022-07-20 MED ORDER — IOHEXOL 350 MG/ML SOLN
75.0000 mL | Freq: Once | INTRAVENOUS | Status: AC | PRN
  Administered 2022-07-20: 75 mL via INTRAVENOUS

## 2022-07-20 MED ORDER — ROSUVASTATIN CALCIUM 5 MG PO TABS: 2.5000 mg | ORAL_TABLET | Freq: Every day | ORAL | Status: DC

## 2022-07-20 MED ORDER — STROKE: EARLY STAGES OF RECOVERY BOOK
Freq: Once | Status: AC
  Filled 2022-07-20: qty 1

## 2022-07-20 MED ORDER — GABAPENTIN 100 MG PO CAPS
100.0000 mg | ORAL_CAPSULE | Freq: Every day | ORAL | Status: DC
  Administered 2022-07-21 – 2022-07-23 (×3): 100 mg via ORAL
  Filled 2022-07-20 (×3): qty 1

## 2022-07-20 MED ORDER — LEVOTHYROXINE SODIUM 25 MCG PO TABS
25.0000 ug | ORAL_TABLET | Freq: Every day | ORAL | Status: DC
  Administered 2022-07-21 – 2022-07-23 (×3): 25 ug via ORAL
  Filled 2022-07-20 (×3): qty 1

## 2022-07-20 MED ORDER — GABAPENTIN 100 MG PO CAPS
200.0000 mg | ORAL_CAPSULE | Freq: Every day | ORAL | Status: DC
  Administered 2022-07-20 – 2022-07-22 (×3): 200 mg via ORAL
  Filled 2022-07-20 (×3): qty 2

## 2022-07-20 MED ORDER — TRAMADOL HCL 50 MG PO TABS
50.0000 mg | ORAL_TABLET | Freq: Four times a day (QID) | ORAL | Status: DC | PRN
  Administered 2022-07-20 – 2022-07-22 (×3): 50 mg via ORAL
  Filled 2022-07-20 (×3): qty 1

## 2022-07-20 MED ORDER — ACETAMINOPHEN 325 MG PO TABS
650.0000 mg | ORAL_TABLET | Freq: Four times a day (QID) | ORAL | Status: DC | PRN
  Administered 2022-07-20 – 2022-07-23 (×5): 650 mg via ORAL
  Filled 2022-07-20 (×5): qty 2

## 2022-07-20 MED ORDER — HYDRALAZINE HCL 20 MG/ML IJ SOLN: 10.0000 mg | INTRAMUSCULAR | Status: DC | PRN

## 2022-07-20 MED ORDER — LEVOTHYROXINE SODIUM 25 MCG PO TABS: 25.0000 ug | ORAL_TABLET | Freq: Every day | ORAL | Status: DC

## 2022-07-20 NOTE — ED Provider Notes (Signed)
[facility] EMERGENCY DEPARTMENT Provider Note   CSN: [id number] Arrival date & time: 07/20/22  1248  An emergency department physician performed an initial assessment on this suspected stroke patient at 1439.  History  Chief Complaint  Patient presents with   Transient Ischemic Attack    Amrita P Woodhead is a 88 y.o. female.  HPI The patient is a 88 year old female past medical history of HTN, HLD, A-fib, bilateral MCA and ACA infarcts, cerebral aneurysm (s/p coiling) presenting for evaluation of a TIA.  The patient states that at 11 AM she developed acute onset of mild headache, dizziness, and aphasia.  She states that symptoms have since resolved.  She denies chest pain, shortness of breath, nausea, vomiting, recent fevers, abdominal pain, or urinary symptoms.   Home Medications Prior to Admission medications   Medication Sig Start Date End Date Taking? Authorizing Provider  bifidobacterium infantis (ALIGN) capsule Take by mouth.    [provider]  Biotin 5000 MCG TABS Take 1 tablet by mouth daily.    [provider]  cholecalciferol (VITAMIN D) 1000 units tablet Take 1,000 Units by mouth daily.    [provider]  Cholecalciferol (VITAMIN D-3) 5000 UNIT/ML LIQD 1 capsule Orally Once a day 06/29/17   [provider]  clobetasol ointment (TEMOVATE) 0.05 % Apply to skin in a thin layer twice day for 2 weeks for a flare then then twice a week at bedtime for a maintenance dose. 02/21/21   Amundson Lorenz Coaster, Everardo All, MD  ELIQUIS 5 MG TABS tablet TAKE 1 TABLET BY MOUTH TWICE DAILY 01/20/22   Adrian Prows, MD  furosemide (LASIX) 20 MG tablet TAKE 1 TABLET BY MOUTH DAILY AS NEEDED FOR LEG SWELLING Patient taking differently: Take 20 mg by mouth daily. 12/17/21   Cantwell, Celeste C, PA-C  gabapentin (NEURONTIN) 100 MG capsule Take 100 mg by mouth 2 (two) times daily.  01/01/19   [provider]  GEMTESA 75 MG TABS Take 1 tablet by  mouth daily. 02/27/21   [provider]  hydrochlorothiazide (HYDRODIURIL) 12.5 MG tablet Take 1 tablet (12.5 mg total) by mouth daily. 10/28/21   Cantwell, Celeste C, PA-C  levothyroxine (SYNTHROID, LEVOTHROID) 25 MCG tablet Take 25 mcg by mouth daily. 02/07/16   [provider]  lisinopril (ZESTRIL) 10 MG tablet Take 1 tablet (10 mg total) by mouth daily. 07/14/22 10/12/22  Custovic, Collene Mares, DO  meclizine (ANTIVERT) 25 MG tablet Take 12.5 mg by mouth 3 (three) times daily as needed for dizziness.    [provider]  nystatin (MYCOSTATIN/NYSTOP) powder Apply 1 application topically 3 (three) times daily. Apply to undergarment or personal pad daily. 12/25/20   Nunzio Cobbs, MD  rosuvastatin (CRESTOR) 5 MG tablet Take 0.5 tablets (2.5 mg total) by mouth at bedtime. 09/28/18   Adrian Prows, MD  sertraline (ZOLOFT) 50 MG tablet Take 50 mg by mouth daily. Patient not taking: Reported on 05/05/2022 01/08/21   [provider]  traMADol (ULTRAM) 50 MG tablet TK 1 T PO Q 6 HOURS PRN 07/19/18   [provider]  valACYclovir (VALTREX) 1000 MG tablet TK 1 T PO TID FOR 7 DAYS 10/20/18   [provider]      Allergies    Peanut-containing drug products and Shellfish allergy    Review of Systems   Review of Systems  See HPI  Physical Exam Updated Vital Signs BP (!) 188/77   Pulse 79   Temp  97.7 F (36.5 C) (Oral)   Resp (!) 22   SpO2 98%  Physical Exam Vitals and nursing note reviewed.  Constitutional:      General: She is not in acute distress.    Appearance: She is well-developed.  HENT:     Head: Normocephalic and atraumatic.  Eyes:     Conjunctiva/sclera: Conjunctivae normal.  Cardiovascular:     Rate and Rhythm: Normal rate and regular rhythm.     Heart sounds: No murmur heard. Pulmonary:     Effort: Pulmonary effort is normal. No respiratory distress.     Breath sounds: Normal breath sounds.  Abdominal:     Palpations: Abdomen  is soft.     Tenderness: There is no abdominal tenderness.  Musculoskeletal:        General: No swelling.     Cervical back: Neck supple.  Skin:    General: Skin is warm and dry.     Capillary Refill: Capillary refill takes less than 2 seconds.  Neurological:     General: No focal deficit present.     Mental Status: She is alert. Mental status is at baseline.     Cranial Nerves: No cranial nerve deficit.     Sensory: No sensory deficit.     Motor: No weakness.     Coordination: Coordination normal.  Psychiatric:        Mood and Affect: Mood normal.     ED Results / Procedures / Treatments   Labs (all labs ordered are listed, but only abnormal results are displayed) Labs Reviewed  COMPREHENSIVE METABOLIC PANEL - Abnormal; Notable for the following components:      Result Value   BUN 7 (*)    All other components within normal limits  PROTIME-INR - Abnormal; Notable for the following components:   Prothrombin Time 16.0 (*)    INR 1.3 (*)    All other components within normal limits  CBC WITH DIFFERENTIAL/PLATELET  APTT  LIPID PANEL  ETHANOL  RAPID URINE DRUG SCREEN, HOSP PERFORMED  URINALYSIS, ROUTINE W REFLEX MICROSCOPIC  I-STAT CHEM 8, ED    EKG None  Radiology CT HEAD CODE STROKE WO CONTRAST  Result Date: 07/20/2022 CLINICAL DATA:  Code stroke.  TIA.  Headache and slurred speech. EXAM: CT ANGIOGRAPHY HEAD AND NECK TECHNIQUE: Multidetector CT imaging of the head and neck was performed using the standard protocol during bolus administration of intravenous contrast. Multiplanar CT image reconstructions and MIPs were obtained to evaluate the vascular anatomy. Carotid stenosis measurements (when applicable) are obtained utilizing NASCET criteria, using the distal internal carotid diameter as the denominator. RADIATION DOSE REDUCTION: This exam was performed according to the departmental dose-optimization program which includes automated exposure control, adjustment of the  mA and/or kV according to patient size and/or use of iterative reconstruction technique. CONTRAST:  65m OMNIPAQUE IOHEXOL 350 MG/ML SOLN COMPARISON:  MR and MRA head 05/03/2020 FINDINGS: CT HEAD FINDINGS Brain: There is no acute intracranial hemorrhage, extra-axial fluid collection, or acute infarct Parenchymal volume is within expected limits for age. The ventricles are normal in size. There is a small area of cortical encephalomalacia in the high left parietal lobe, unchanged. Additional patchy and confluence hypodensity in the supratentorial white matter likely reflects sequela of chronic small-vessel ischemic change. There is no mass lesion. There is no mass effect or midline shift. Vascular: Postsurgical changes reflecting prior A-comm aneurysm treatment are noted. No hyperdense vessel is seen. Skull: Normal. Negative for fracture or focal lesion. Sinuses/Orbits: The  paranasal sinuses are clear. A left lens implant is noted. The globes and orbits are otherwise unremarkable. Other: None. ASPECTS [facility] Stroke Program Early CT Score) - Ganglionic level infarction (caudate, lentiform nuclei, internal capsule, insula, M1-M3 cortex): 7 - Supraganglionic infarction (M4-M6 cortex): 3 Total score (0-10 with 10 being normal): 10 CTA NECK FINDINGS Aortic arch: There is mild calcified plaque in the imaged aortic arch. The origins of the major branch vessels are patent. The subclavian arteries are patent to the level imaged. Right carotid system: The right common, internal, and external carotid arteries are patent, with mild plaque at the bifurcation but no hemodynamically significant stenosis or occlusion. There is no dissection or aneurysm. Left carotid system: The left common, internal, and external carotid arteries are patent, with mild plaque at the bifurcation but no hemodynamically significant stenosis or occlusion. There is no dissection or aneurysm. Vertebral arteries: The vertebral arteries are patent, without  hemodynamically significant stenosis or occlusion. There is no dissection or aneurysm. Skeleton: There is overall mild for age degenerative change of the cervical spine. There is no acute osseous abnormality or suspicious osseous lesion. There is no visible canal hematoma. Other neck: The soft tissues of the neck are unremarkable. Upper chest: The imaged lung apices are clear. Review of the MIP images confirms the above findings CTA HEAD FINDINGS Anterior circulation: There is mild calcified plaque in the carotid siphons without hemodynamically significant stenosis. The bilateral MCAs are patent, without proximal stenosis or occlusion. 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 Streak artifact degrades evaluation of the surrounding vasculature. The left A1 segment is patent. The ACA is beyond the stent and coil pack appear patent, without proximal high-grade stenosis or occlusion. There is no definite evidence of residual or recurrent aneurysm. There is no new aneurysm. Posterior circulation: The right V4 segment is occluded after the PICA origin, similar to the prior MRA. The left V4 segment is widely patent. PICA is identified bilaterally. The basilar artery is patent. The bilateral PCAs are patent, without proximal stenosis or occlusion. Bilateral posterior communicating arteries are identified. There is no aneurysm or AVM. Venous sinuses: As permitted by contrast timing, patent. Anatomic variants: None. Review of the MIP images confirms the above findings IMPRESSION: 1. No acute intracranial pathology. 2. No emergent large vessel occlusion. 3. 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 4. Mild calcified plaque at the carotid bifurcations and siphons without hemodynamically significant stenosis or occlusion. 5. 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 No definite evidence of recurrent or residual aneurysm though evaluation is degraded, by streak artifact. Findings were discussed with Dr. Curly Shores at 3:02pm. Electronically Signed   By: Valetta Mole M.D.   On: 07/20/2022 15:17   CT ANGIO HEAD NECK W WO CM (CODE STROKE)  Result Date: 07/20/2022 CLINICAL DATA:  Code stroke.  TIA.  Headache and slurred speech. EXAM: CT ANGIOGRAPHY HEAD AND NECK TECHNIQUE: Multidetector CT imaging of the head and neck was performed using the standard protocol during bolus administration of intravenous contrast. Multiplanar CT image reconstructions and MIPs were obtained to evaluate the vascular anatomy. Carotid stenosis measurements (when applicable) are obtained utilizing NASCET criteria, using the distal internal carotid diameter as the denominator. RADIATION DOSE REDUCTION: This exam was performed according to the departmental dose-optimization program which includes automated exposure control, adjustment of the mA and/or kV according to patient size and/or use  of iterative reconstruction technique. CONTRAST:  49m OMNIPAQUE IOHEXOL 350 MG/ML SOLN COMPARISON:  MR and MRA head 05/03/2020 FINDINGS: CT HEAD FINDINGS Brain: There is no acute intracranial hemorrhage, extra-axial fluid collection, or acute infarct Parenchymal volume is within expected limits for age. The ventricles are normal in size. There is a small area of cortical encephalomalacia in the high left parietal lobe, unchanged. Additional patchy and confluence hypodensity in the supratentorial white matter likely reflects sequela of chronic small-vessel ischemic change. There is no mass lesion. There is no mass effect or midline shift. Vascular: Postsurgical changes reflecting prior A-comm aneurysm treatment are noted. No hyperdense vessel is seen. Skull: Normal. Negative for fracture or focal lesion. Sinuses/Orbits: The paranasal sinuses are clear. A left lens implant is noted. The globes  and orbits are otherwise unremarkable. Other: None. [facility] - Ganglionic level infarction (caudate, lentiform nuclei, internal capsule, insula, M1-M3 cortex): 7 - Supraganglionic infarction (M4-M6 cortex): 3 Total score (0-10 with 10 being normal): 10 CTA NECK FINDINGS Aortic arch: There is mild calcified plaque in the imaged aortic arch. The origins of the major branch vessels are patent. The subclavian arteries are patent to the level imaged. Right carotid system: The right common, internal, and external carotid arteries are patent, with mild plaque at the bifurcation but no hemodynamically significant stenosis or occlusion. There is no dissection or aneurysm. Left carotid system: The left common, internal, and external carotid arteries are patent, with mild plaque at the bifurcation but no hemodynamically significant stenosis or occlusion. There is no dissection or aneurysm. Vertebral arteries: The vertebral arteries are patent, without hemodynamically significant stenosis or occlusion. There is no dissection or aneurysm. Skeleton: There is overall mild for age degenerative change of the cervical spine. There is no acute osseous abnormality or suspicious osseous lesion. There is no visible canal hematoma. Other neck: The soft tissues of the neck are unremarkable. Upper chest: The imaged lung apices are clear. Review of the MIP images confirms the above findings CTA HEAD FINDINGS Anterior circulation: There is mild calcified plaque in the carotid siphons without hemodynamically significant stenosis. The bilateral MCAs are patent, without proximal stenosis or occlusion. 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 Streak artifact degrades evaluation of the surrounding vasculature. The left A1 segment is patent. The ACA is beyond the stent and coil pack appear patent, without proximal high-grade  stenosis or occlusion. There is no definite evidence of residual or recurrent aneurysm. There is no new aneurysm. Posterior circulation: The right V4 segment is occluded after the PICA origin, similar to the prior MRA. The left V4 segment is widely patent. PICA is identified bilaterally. The basilar artery is patent. The bilateral PCAs are patent, without proximal stenosis or occlusion. Bilateral posterior communicating arteries are identified. There is no aneurysm or AVM. Venous sinuses: As permitted by contrast timing, patent. Anatomic variants: None. Review of the MIP images confirms the above findings IMPRESSION: 1. No acute intracranial pathology. 2. No emergent large vessel occlusion. 3. 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 4. Mild calcified plaque at the carotid bifurcations and siphons without hemodynamically significant stenosis or occlusion. 5. 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 No definite evidence of recurrent or residual aneurysm though evaluation is degraded, by streak artifact. Findings were discussed with Dr. BCurly Shoresat 3:02pm. Electronically Signed   By: PCourt JoyD.  On: 07/20/2022 15:17    Procedures Procedures    Medications Ordered in ED Medications  iohexol (OMNIPAQUE) 350 MG/ML injection 75 mL (75 mLs Intravenous Contrast Given 07/20/22 1459)    ED Course/ Medical Decision Making/ A&P                           Medical Decision Making  The patient is a 88 year old female past medical history of HTN, HLD, A-fib, bilateral MCA and ACA infarcts, cerebral aneurysm (s/p coiling) presenting for evaluation of a TIA.  Differential diagnosis considered includes: CVA, TIA, metabolic abnormality, hypoglycemia, electrolyte dysfunction, medication side effect.  On initial evaluation, the patient was hemodynamically stable, afebrile, with a normal neurological exam.  The  patient's diagnostic workup included a CBC with white blood cell count of 5.9 hemoglobin of 12.1; lipid panel which was in process; CMP without significant abnormalities; PT/INR with PT elevated 16.0, INR 1.3, PTT 32; urinalysis which was pending.  The patient also received an EKG which showed sinus rhythm with appropriate intervals and no ischemic changes.  CT head code stroke was ordered which showed no acute intracranial pathology or large vessel occlusion.  The patient's daughter presented to bedside and was reporting that the patient had a change from her baseline with noted deficits in her speech.  Based on the daughter's report, code stroke was activated and CT angiography of the head and neck was performed.  The patient CT a head and neck did not show any acute pathology or large vessel acute.  There was an unchanged occlusion of the right V4 segment of PICA.  The patient was admitted for an MRI brain.  At the time of signout, care was transferred to Fatima Sanger, PA-C with plan for hospital admission for stroke workup.  At the transfer of care, the patient had remained hemodynamically stable for the duration of her time of my care.  Amount and/or Complexity of Data Reviewed External Data Reviewed: notes. Labs: ordered. Decision-making details documented in ED Course. Radiology: ordered and independent interpretation performed. Decision-making details documented in ED Course. ECG/medicine tests: ordered and independent interpretation performed. Decision-making details documented in ED Course.   Patient's presentation is most consistent with acute presentation with potential threat to life or bodily function.         Final Clinical Impression(s) / ED Diagnoses Final diagnoses:  TIA (transient ischemic attack)    Rx / DC Orders ED Discharge Orders     None         Dani Gobble, MD 07/20/22 1604    Sherwood Gambler, MD 07/28/22 [phone]

## 2022-07-20 NOTE — ED Provider Notes (Signed)
Received sign out from Dr. Regenia Skeeter, pending neurology recommendations. Likely admission. Physical Exam  BP (!) 155/97 (BP Location: Left Arm)   Pulse 65   Temp 97.7 F (36.5 C) (Oral)   Resp 16   SpO2 95%   Pt not present in room for re-eval for PE  Procedures  Procedures  ED Course / MDM    Medical Decision Making Amount and/or Complexity of Data Reviewed Labs: ordered. Radiology: ordered.   Received chat from  [facility], they recommend admission to hospitalist for CVA work-up.   Spoke with Dr. Tamala Julian, admitted to hospitalist, Dr. Tamala Julian for CVA eval/work-up. Informed of neuro recommendations for EEG if MRI negative.        Osvaldo Shipper, [place] 07/20/22 1559    Davonna Belling, MD 07/20/22 [phone]

## 2022-07-20 NOTE — Code Documentation (Signed)
Stroke Response Nurse Documentation Code Documentation  Sara Frye is a 88 y.o. female arriving to [facility]  via [facility] on 07/20/2022 with past medical hx of Afib, HTN, Stroke. On Eliquis (apixaban) daily. Code stroke was activated by ED.   Patient from home where she was LKW at 1030 and now complaining of trouble speaking. Pt reported that she was at home and 1030 she noted a headache and trouble getting her words out with some slurred speech. Pt reported when she arrived to the ED that it had resolved. When being assessed by EDP, the patient was noted to have mild aphasia still and Code Stroke activated.  Stroke team at the bedside after patient activation. Labs drawn and patient cleared for CT by Dr. Regenia Skeeter. Patient to CT with team. NIHSS 1, see documentation for details and code stroke times. Patient with Expressive aphasia  on exam. The following imaging was completed:  CT Head and CTA. Patient is not a candidate for IV Thrombolytic due to being on Eliquis. Patient is not a candidate for IR due to no LVO on imaging.   Care Plan: q2 NIHSS/VS, Permissive HTN 220/120.   Bedside handoff with ED RN Paden.    Kathrin Greathouse  Stroke Response RN

## 2022-07-20 NOTE — Consult Note (Signed)
Neurology Consultation  Reason for Consult: Code Stroke Referring Physician: Regenia Skeeter  CC: Aphasia   History is obtained from:daughter, chart review  HPI: Sara Frye is a 88 y.o. female with a past medical history of afib on eliquis (last dose this morning), hypertension hyperlipidemia, CVA, cerebral aneurysm s/p coiling who is presenting with difficulty talking, headache, and dizziness. He daughter was with her around 1000 when she took her father to a doctors appointment. When she got back her mom was sitting on the couch saying "I can't talk, I can't talk" and reporting a headache. She did take a tramadol which helped the headache, but did not change her speech. Her daughter states that he speech sounds slurred. The patient states that she is having trouble finding the correct words. She denies recent falls, numbness, or tingling. She does not notice any focal neurological deficits.    LKW: 1030 tpa given?: no, on Eliquis Premorbid modified Rankin scale (mRS): 1  ROS: Full ROS was performed and is negative except as noted in the HPI.  Past Medical History:  Diagnosis Date   Arthritis    spine   Atrial fibrillation ([place])    Brain aneurysm 01/07/2017   Dysrhythmia    afib   H/O cerebral aneurysm repair ACA S/P Coiling by Dr. Estanislado Pandy 01/07/17 01/07/2017   Headache    with Eliquis   Hypertension    Hypothyroidism    Maternal blood transfusion    Neuromuscular disorder ([place])    spinal injury- "I'm unsure of walking, I'm very careful"   Shingles    Stroke ([place]) 11/2015      Family History  Problem Relation Age of Onset   Cancer Mother    Breast cancer Sister    Heart failure Brother        deceased age 51   Cancer Brother    Cancer Brother    Cancer Sister      Social History:   reports that she has never smoked. She has never used smokeless tobacco. She reports that she does not drink alcohol and does not use drugs.  Medications No current  facility-administered medications for this encounter.  Current Outpatient Medications:    bifidobacterium infantis (ALIGN) capsule, Take by mouth., Disp: , Rfl:    Biotin 5000 MCG TABS, Take 1 tablet by mouth daily., Disp: , Rfl:    cholecalciferol (VITAMIN D) 1000 units tablet, Take 1,000 Units by mouth daily., Disp: , Rfl:    Cholecalciferol (VITAMIN D-3) 5000 UNIT/ML LIQD, 1 capsule Orally Once a day, Disp: , Rfl:    clobetasol ointment (TEMOVATE) 0.05 %, Apply to skin in a thin layer twice day for 2 weeks for a flare then then twice a week at bedtime for a maintenance dose., Disp: 30 g, Rfl: 1   ELIQUIS 5 MG TABS tablet, TAKE 1 TABLET BY MOUTH TWICE DAILY, Disp: 180 tablet, Rfl: 3   furosemide (LASIX) 20 MG tablet, TAKE 1 TABLET BY MOUTH DAILY AS NEEDED FOR LEG SWELLING (Patient taking differently: Take 20 mg by mouth daily.), Disp: 90 tablet, Rfl: 1   gabapentin (NEURONTIN) 100 MG capsule, Take 100 mg by mouth 2 (two) times daily. , Disp: , Rfl:    GEMTESA 75 MG TABS, Take 1 tablet by mouth daily., Disp: , Rfl:    hydrochlorothiazide (HYDRODIURIL) 12.5 MG tablet, Take 1 tablet (12.5 mg total) by mouth daily., Disp: 90 tablet, Rfl: 3   levothyroxine (SYNTHROID, LEVOTHROID) 25 MCG tablet, Take 25 mcg by  mouth daily., Disp: , Rfl: 0   lisinopril (ZESTRIL) 10 MG tablet, Take 1 tablet (10 mg total) by mouth daily., Disp: 90 tablet, Rfl: 3   meclizine (ANTIVERT) 25 MG tablet, Take 12.5 mg by mouth 3 (three) times daily as needed for dizziness., Disp: , Rfl:    nystatin (MYCOSTATIN/NYSTOP) powder, Apply 1 application topically 3 (three) times daily. Apply to undergarment or personal pad daily., Disp: 15 g, Rfl: 2   rosuvastatin (CRESTOR) 5 MG tablet, Take 0.5 tablets (2.5 mg total) by mouth at bedtime., Disp: 90 tablet, Rfl: 1   sertraline (ZOLOFT) 50 MG tablet, Take 50 mg by mouth daily. (Patient not taking: Reported on 05/05/2022), Disp: , Rfl:    traMADol (ULTRAM) 50 MG tablet, TK 1 T PO Q 6 HOURS  PRN, Disp: , Rfl:    valACYclovir (VALTREX) 1000 MG tablet, TK 1 T PO TID FOR 7 DAYS, Disp: , Rfl:   Facility-Administered Medications Ordered in Other Encounters:    ceFAZolin (ANCEF) 2 g in dextrose 5 % 100 mL IVPB, 2 g, Intravenous, Once, Candiss Norse A, PA-C   Exam: Current vital signs: BP (!) 188/77   Pulse 79   Temp 97.7 F (36.5 C) (Oral)   Resp (!) 22   SpO2 98%  Vital signs in last 24 hours: Temp:  [97.7 F (36.5 C)] 97.7 F (36.5 C) (11/27 1255) Pulse Rate:  [65-79] 79 (11/27 1515) Resp:  [16-22] 22 (11/27 1515) BP: (155-188)/(66-97) 188/77 (11/27 1515) SpO2:  [93 %-98 %] 98 % (11/27 1515)  GENERAL: Awake, alert in NAD HEENT: - Normocephalic and atraumatic, dry mm, no LN++, no Thyromegally LUNGS - Clear to auscultation bilaterally with no wheezes CV - S1S2 RRR, no m/r/g, equal pulses bilaterally. ABDOMEN - Soft, nontender, nondistended with normoactive BS Ext: warm, well perfused, intact peripheral pulses, no edema  NEURO:  Mental Status: AA&Ox3   Language: speech is clear.  She does have some difficulty with naming and loss of fluency. Repetition is intact.  Cranial Nerves: PERRL 25m/brisk. EOMI, visual fields full, no facial asymmetry, facial sensation intact, hearing intact, tongue/uvula/soft palate midline, normal sternocleidomastoid and trapezius muscle strength. No evidence of tongue atrophy or fasciculations Motor: moves all extremities equally, no drift noted.  Tone: is normal and bulk is normal Sensation- Intact to light touch bilaterally Coordination: FTN intact bilaterally, no ataxia in BLE. Gait- deferred  NIHSS components Score: Comment  1a Level of Conscious 0'[x]'$  1'[]'$  2'[]'$  3'[]'$         1b LOC Questions 0'[x]'$  1'[]'$  2'[]'$           1c LOC Commands 0'[x]'$  1'[]'$  2'[]'$           2 Best Gaze 0'[x]'$  1'[]'$  2'[]'$           3 Visual 0'[x]'$  1'[]'$  2'[]'$  3'[]'$         4 Facial Palsy 0'[x]'$  1'[]'$  2'[]'$  3'[]'$         5a Motor Arm - left 0'[x]'$  1'[]'$  2'[]'$  3'[]'$  4'[]'$  UN'[]'$     5b Motor Arm - Right 0'[x]'$  1'[]'$   2'[]'$  3'[]'$  4'[]'$  UN'[]'$     6a Motor Leg - Left 0'[x]'$  1'[]'$  2'[]'$  3'[]'$  4'[]'$  UN'[]'$     6b Motor Leg - Right 0'[x]'$  1'[]'$  2'[]'$  3'[]'$  4'[]'$  UN'[]'$     7 Limb Ataxia 0'[x]'$  1'[]'$  2'[]'$  3'[]'$  UN'[]'$       8 Sensory 0'[x]'$  1'[]'$  2'[]'$  UN'[]'$         9 Best Language 0'[]'$  1'[x]'$  2'[]'$  3'[]'$         10  Dysarthria 0'[x]'$  1'[]'$  2'[]'$  UN'[]'$         11 Extinct. and Inattention 0'[x]'$  1'[]'$  2'[]'$           TOTAL: 1        Labs I have reviewed labs in epic and the results pertinent to this consultation are:  CBC    Component Value Date/Time   WBC 5.9 07/20/2022 1429   RBC 4.22 07/20/2022 1429   HGB 12.1 07/20/2022 1429   HGB 12.2 10/09/2019 0812   HCT 36.5 07/20/2022 1429   HCT 36.6 10/09/2019 0812   PLT 245 07/20/2022 1429   PLT 262 10/09/2019 0812   MCV 86.5 07/20/2022 1429   MCV 88 10/09/2019 0812   MCH 28.7 07/20/2022 1429   MCHC 33.2 07/20/2022 1429   RDW 14.2 07/20/2022 1429   RDW 13.6 10/09/2019 0812   LYMPHSABS 1.8 07/20/2022 1429   MONOABS 0.5 07/20/2022 1429   EOSABS 0.2 07/20/2022 1429   BASOSABS 0.0 07/20/2022 1429    CMP     Component Value Date/Time   NA 144 10/09/2019 0812   K 5.5 (H) 10/09/2019 0812   CL 106 10/09/2019 0812   CO2 25 10/09/2019 0812   GLUCOSE 85 10/09/2019 0812   GLUCOSE 100 (H) 03/08/2018 0710   BUN 18 10/09/2019 0812   CREATININE 0.84 10/09/2019 0812   CALCIUM 10.1 10/09/2019 0812   PROT 7.2 10/09/2019 0812   ALBUMIN 4.5 10/09/2019 0812   AST 17 10/09/2019 0812   ALT 15 10/09/2019 0812   ALKPHOS 61 10/09/2019 0812   BILITOT 0.3 10/09/2019 0812   GFRNONAA 62 10/09/2019 0812   GFRAA 72 10/09/2019 0812    Lipid Panel     Component Value Date/Time   CHOL 136 10/09/2019 0812   TRIG 75 10/09/2019 0812   HDL 59 10/09/2019 0812   CHOLHDL 3.0 12/17/2015 0436   VLDL 24 12/17/2015 0436   LDLCALC 62 10/09/2019 0812     Imaging I have reviewed the images obtained:  CT-head and CTA Head and Neck 1. No acute intracranial pathology. 2. No emergent large vessel occlusion. 3. 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 4. Mild calcified plaque at the carotid bifurcations and siphons without hemodynamically significant stenosis or occlusion. 5. 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 No definite evidence of recurrent or residual aneurysm though evaluation is degraded, by streak artifact.  Assessment: 88 y.o. female with a past medical history of afib on eliquis (last dose this morning), hypertension hyperlipidemia, CVA, cerebral aneurysm s/p coiling who is presenting with difficulty talking, headache, and dizziness. She was not a candidate for TNK due to taking eliquis this morning. Will admit to hospitalist service for continued stroke work up.   Impression:Acute ischemic infarct   Recommendations: - HgbA1c, fasting lipid panel - MRI brain w/o contrast - Frequent neuro checks - Echocardiogram not needed from stroke perspective given known Afib indicated for chronic AC, defer to medicine if needed for workup of dizziness - Hold Eliquis pending MRI results; if negative for stroke can restart eliquis, if positive hold AC until cleared by stroke team  - Risk factor modification - Telemetry monitoring - PT consult, OT consult, Speech consult - Stroke team to follow  Patient seen and examined by NP/APP with MD. MD to update note as needed.   Janine Ores, DNP, FNP-BC [facility] Pager: [phone]  Attending Neurologist's note:  I personally saw this patient, gathering  history, performing a full neurologic examination, reviewing relevant labs, personally reviewing relevant imaging including Head CT and CTA, and formulated the assessment and plan, adding the note above for completeness and clarity to accurately reflect my thoughts   Lesleigh Noe MD-PhD [facility] [phone]

## 2022-07-20 NOTE — ED Notes (Signed)
Patient transported to MRI 

## 2022-07-20 NOTE — ED Triage Notes (Signed)
Patient BIB [facility] from home after calling for headache and slurred speech lasting for around an hour. Patient self administered tramadol at onset of symptoms. No symptoms reported at this time. A&Ox4 w/ EMS

## 2022-07-20 NOTE — Progress Notes (Signed)
[facility] night cross cover note:  Patient requesting acetaminophen for neck/head discomfort.  I subsequently placed order for prn acetaminophen.      Babs Bertin, DO Hospitalist

## 2022-07-20 NOTE — H&P (Addendum)
History and Physical    Patient: Sara Frye PJA:[id number] DOB: 1929-03-01 DOA: 07/20/2022 DOS: the patient was seen and examined on 07/20/2022 PCP: Deland Pretty, MD  Patient coming from: Home Via EMS  Chief Complaint:  Chief Complaint  Patient presents with   Transient Ischemic Attack   HPI: Sara Frye is a 88 y.o. female with medical history significant of hypertension, hyperlipidemia, paroxysmal atrial fibrillation, CVA, cerebral aneurysm s/p coiling, and hypothyroidism who presented with difficulty talking at 49 AM.  Reported associated symptoms of mild headache and dizziness. Her daughter had left the patient in her normal state of health at around 10 AM to take her stepfather to a doctor's appointment.  The patient had being sitting up in the kitchen and washing  clothes.  When she got back she found the patient on the couch stating that she could not talk.  Patient reports knowing what she wants to say, but having difficulty getting words out.  She had taken a tramadol which helped with headache, but did not improve her speech.  She chronically has abdominal pain and back pain.  Her daughter makes note that she had been switched from metoprolol to lisinopril on 11/21 by cardiology after being found to be bradycardic with heart rates in 40s.  In the emergency department patient was seen as a code stroke. CT scan of the head did not note any acute abnormality.  Patient was not thought to be a thrombolytics candidate.  CTA of the head and neck did not note any large vessel occlusion.  Labs were unremarkable.  Neurology recommended admission for completion of TIA/stroke workup.  Review of Systems: As mentioned in the history of present illness. All other systems reviewed and are negative. Past Medical History:  Diagnosis Date   Arthritis    spine   Atrial fibrillation ([place])    Brain aneurysm 01/07/2017   Dysrhythmia    afib   H/O cerebral aneurysm repair ACA S/P Coiling  by Dr. Estanislado Pandy 01/07/17 01/07/2017   Headache    with Eliquis   Hypertension    Hypothyroidism    Maternal blood transfusion    Neuromuscular disorder ([place])    spinal injury- "I'm unsure of walking, I'm very careful"   Shingles    Stroke ([place]) 11/2015   Past Surgical History:  Procedure Laterality Date   ABDOMINAL HYSTERECTOMY     ovaries removed   APPENDECTOMY     CARPAL TUNNEL RELEASE Bilateral    CATARACT EXTRACTION     CHOLECYSTECTOMY     IR ANGIO INTRA EXTRACRAN SEL COM CAROTID INNOMINATE BILAT MOD SED  12/30/2016   IR ANGIO INTRA EXTRACRAN SEL COM CAROTID INNOMINATE BILAT MOD SED  03/08/2018   IR ANGIO INTRA EXTRACRAN SEL INTERNAL CAROTID UNI R MOD SED  01/07/2017   IR ANGIO VERTEBRAL SEL SUBCLAVIAN INNOMINATE UNI R MOD SED  12/30/2016   IR ANGIO VERTEBRAL SEL VERTEBRAL BILAT MOD SED  03/08/2018   IR ANGIO VERTEBRAL SEL VERTEBRAL UNI L MOD SED  12/30/2016   IR ANGIOGRAM FOLLOW UP STUDY  01/07/2017   IR ANGIOGRAM FOLLOW UP STUDY  01/07/2017   IR ANGIOGRAM FOLLOW UP STUDY  01/07/2017   IR NEURO EACH ADD'L AFTER BASIC UNI RIGHT (MS)  01/07/2017   IR RADIOLOGIST EVAL & MGMT  11/24/2016   IR RADIOLOGIST EVAL & MGMT  01/25/2017   IR TRANSCATH/EMBOLIZ  01/07/2017   JOINT REPLACEMENT Bilateral    arthroscopic - knees   RADIOLOGY WITH ANESTHESIA N/A  12/30/2016   Procedure: RADIOLOGY WITH ANESTHESIA  EMBOLIZATION;  Surgeon: Luanne Bras, MD;  Location: [place];  Service: Radiology;  Laterality: N/A;   RADIOLOGY WITH ANESTHESIA N/A 01/07/2017   Procedure: EMBOLIZATION;  Surgeon: Luanne Bras, MD;  Location: [place];  Service: Radiology;  Laterality: N/A;   Social History:  reports that she has never smoked. She has never used smokeless tobacco. She reports that she does not drink alcohol and does not use drugs.  Allergies  Allergen Reactions   Peanut-Containing Drug Products Itching   Shellfish Allergy Itching    Family History  Problem Relation Age of Onset    Cancer Mother    Breast cancer Sister    Heart failure Brother        deceased age 39   Cancer Brother    Cancer Brother    Cancer Sister     Prior to Admission medications   Medication Sig Start Date End Date Taking? Authorizing Provider  bifidobacterium infantis (ALIGN) capsule Take by mouth.    [provider]  Biotin 5000 MCG TABS Take 1 tablet by mouth daily.    [provider]  cholecalciferol (VITAMIN D) 1000 units tablet Take 1,000 Units by mouth daily.    [provider]  Cholecalciferol (VITAMIN D-3) 5000 UNIT/ML LIQD 1 capsule Orally Once a day 06/29/17   [provider]  clobetasol ointment (TEMOVATE) 0.05 % Apply to skin in a thin layer twice day for 2 weeks for a flare then then twice a week at bedtime for a maintenance dose. 02/21/21   Amundson Lorenz Coaster, Everardo All, MD  ELIQUIS 5 MG TABS tablet TAKE 1 TABLET BY MOUTH TWICE DAILY 01/20/22   Adrian Prows, MD  furosemide (LASIX) 20 MG tablet TAKE 1 TABLET BY MOUTH DAILY AS NEEDED FOR LEG SWELLING Patient taking differently: Take 20 mg by mouth daily. 12/17/21   Cantwell, Celeste C, PA-C  gabapentin (NEURONTIN) 100 MG capsule Take 100 mg by mouth 2 (two) times daily.  01/01/19   [provider]  GEMTESA 75 MG TABS Take 1 tablet by mouth daily. 02/27/21   [provider]  hydrochlorothiazide (HYDRODIURIL) 12.5 MG tablet Take 1 tablet (12.5 mg total) by mouth daily. 10/28/21   Cantwell, Celeste C, PA-C  levothyroxine (SYNTHROID, LEVOTHROID) 25 MCG tablet Take 25 mcg by mouth daily. 02/07/16   [provider]  lisinopril (ZESTRIL) 10 MG tablet Take 1 tablet (10 mg total) by mouth daily. 07/14/22 10/12/22  Custovic, Collene Mares, DO  meclizine (ANTIVERT) 25 MG tablet Take 12.5 mg by mouth 3 (three) times daily as needed for dizziness.    [provider]  nystatin (MYCOSTATIN/NYSTOP) powder Apply 1 application topically 3 (three) times daily. Apply to undergarment or personal pad daily.  12/25/20   Nunzio Cobbs, MD  rosuvastatin (CRESTOR) 5 MG tablet Take 0.5 tablets (2.5 mg total) by mouth at bedtime. 09/28/18   Adrian Prows, MD  sertraline (ZOLOFT) 50 MG tablet Take 50 mg by mouth daily. Patient not taking: Reported on 05/05/2022 01/08/21   [provider]  traMADol (ULTRAM) 50 MG tablet TK 1 T PO Q 6 HOURS PRN 07/19/18   [provider]  valACYclovir (VALTREX) 1000 MG tablet TK 1 T PO TID FOR 7 DAYS 10/20/18   [provider]    Physical Exam: Vitals:   07/20/22 1255 07/20/22 1433 07/20/22 1515 07/20/22 1648  BP: (!) 159/66 (!) 155/97 (!) 188/77   Pulse: 67 65 79  Resp: 17 16 (!) 22   Temp: 97.7 F (36.5 C)   (!) 97.4 F (36.3 C)  TempSrc: Oral   Oral  SpO2: 93% 95% 98%    Constitutional: Elderly female currently in no acute distress Eyes: PERRL, lids and conjunctivae normal ENMT: Mucous membranes are moist.   Neck: normal, supple Respiratory: clear to auscultation bilaterally, no wheezing, no crackles. Normal respiratory effort. No accessory muscle use.  Cardiovascular: Regular rate and rhythm, no murmurs / rubs / gallops. No extremity edema. 2+ pedal pulses. No carotid bruits.  Abdomen: no tenderness, no masses palpated. Bowel sounds positive.  Musculoskeletal: no clubbing / cyanosis. No joint deformity upper and lower extremities. Good ROM, no contractures. Normal muscle tone.  Skin: no rashes, lesions, ulcers. No induration Neurologic: CN 2-12 grossly intact.  Expressive aphasia. Strength 4+/5 in all 4.  Psychiatric: Normal judgment and insight. Alert and oriented x 3. Normal mood.   Data Reviewed:  EKG reveals sinus rhythm 81 bpm.  Reviewed labs, imaging and pertinent records as noted above in HPI  Assessment and Plan: Aphasia secondary to CVA Acute.  Patient presents with complaints of aphasia since 11 AM this morning.  CT brain and CTA of the head and neck did not reveal any acute abnormality or signs of large vessel  occlusion.  Patient was not a thrombolytics candidate.  Neurology recommended admission for completion of stroke workup. MRI revealed small acute right cerebellar infarct -Admit to a telemetry bed -Stroke order set utilized -Neurochecks -Follow-up hemoglobin A1c -PT/OT/speech to evaluate and treat -Neurology consulted,will follow-up for any further recommendations  Paroxysmal atrial fibrillation on chronic anticoagulation Patient appears to be in sinus rhythm at this time.  She had taken Eliquis this morning prior to arrival. -Hold Eliquis due to acute stroke  Essential hypertension Blood pressures elevated up to 188/77 -Hold home blood pressure regimen allow for permissive hypertension  Hyperlipidemia LDL 84.  Patient currently on Crestor 2.5 mg daily. -Goal LDL less than 70 -Continue Crestor and increase dose if able  History of CVA Patient noted to have numerous small bilateral MCA infarcts secondary to atrial fibrillation back in 11/2015. -Continue statin  History of cerebral aneurysm S/p endovascular coiling anterior communicating artery aneurysm in 12/2016  Hypothyroidism -Check TSH -Continue levothyroxine  DVT prophylaxis: Lovenox Advance Care Planning:   Code Status: Prior    Consults: Neurology  Family Communication: Daughter updated at bedside Severity of Illness: The appropriate patient status for this patient is OBSERVATION. Observation status is judged to be reasonable and necessary in order to provide the required intensity of service to ensure the patient's safety. The patient's presenting symptoms, physical exam findings, and initial radiographic and laboratory data in the context of their medical condition is felt to place them at decreased risk for further clinical deterioration. Furthermore, it is anticipated that the patient will be medically stable for discharge from the hospital within 2 midnights of admission.   Author: Norval Morton, MD 07/20/2022  6:39 PM  For on call review [email or web address].

## 2022-07-21 ENCOUNTER — Observation Stay (HOSPITAL_COMMUNITY): Payer: Medicare Other

## 2022-07-21 ENCOUNTER — Ambulatory Visit: Payer: Medicare Other | Admitting: Internal Medicine

## 2022-07-21 DIAGNOSIS — G459 Transient cerebral ischemic attack, unspecified: Secondary | ICD-10-CM | POA: Diagnosis present

## 2022-07-21 DIAGNOSIS — Z79899 Other long term (current) drug therapy: Secondary | ICD-10-CM | POA: Diagnosis not present

## 2022-07-21 DIAGNOSIS — G8194 Hemiplegia, unspecified affecting left nondominant side: Secondary | ICD-10-CM | POA: Diagnosis present

## 2022-07-21 DIAGNOSIS — R29701 NIHSS score 1: Secondary | ICD-10-CM | POA: Diagnosis present

## 2022-07-21 DIAGNOSIS — M549 Dorsalgia, unspecified: Secondary | ICD-10-CM | POA: Diagnosis present

## 2022-07-21 DIAGNOSIS — G8929 Other chronic pain: Secondary | ICD-10-CM | POA: Diagnosis present

## 2022-07-21 DIAGNOSIS — E039 Hypothyroidism, unspecified: Secondary | ICD-10-CM | POA: Diagnosis present

## 2022-07-21 DIAGNOSIS — E785 Hyperlipidemia, unspecified: Secondary | ICD-10-CM

## 2022-07-21 DIAGNOSIS — Z91013 Allergy to seafood: Secondary | ICD-10-CM | POA: Diagnosis not present

## 2022-07-21 DIAGNOSIS — R109 Unspecified abdominal pain: Secondary | ICD-10-CM | POA: Diagnosis present

## 2022-07-21 DIAGNOSIS — Z8673 Personal history of transient ischemic attack (TIA), and cerebral infarction without residual deficits: Secondary | ICD-10-CM | POA: Diagnosis not present

## 2022-07-21 DIAGNOSIS — I639 Cerebral infarction, unspecified: Secondary | ICD-10-CM | POA: Diagnosis present

## 2022-07-21 DIAGNOSIS — D649 Anemia, unspecified: Secondary | ICD-10-CM | POA: Diagnosis present

## 2022-07-21 DIAGNOSIS — Z79890 Hormone replacement therapy (postmenopausal): Secondary | ICD-10-CM | POA: Diagnosis not present

## 2022-07-21 DIAGNOSIS — Z8679 Personal history of other diseases of the circulatory system: Secondary | ICD-10-CM | POA: Diagnosis not present

## 2022-07-21 DIAGNOSIS — Z6834 Body mass index (BMI) 34.0-34.9, adult: Secondary | ICD-10-CM | POA: Diagnosis not present

## 2022-07-21 DIAGNOSIS — Z91010 Allergy to peanuts: Secondary | ICD-10-CM | POA: Diagnosis not present

## 2022-07-21 DIAGNOSIS — I6389 Other cerebral infarction: Secondary | ICD-10-CM | POA: Diagnosis not present

## 2022-07-21 DIAGNOSIS — I63211 Cerebral infarction due to unspecified occlusion or stenosis of right vertebral arteries: Secondary | ICD-10-CM

## 2022-07-21 DIAGNOSIS — E669 Obesity, unspecified: Secondary | ICD-10-CM | POA: Diagnosis present

## 2022-07-21 DIAGNOSIS — I63541 Cerebral infarction due to unspecified occlusion or stenosis of right cerebellar artery: Secondary | ICD-10-CM | POA: Diagnosis present

## 2022-07-21 DIAGNOSIS — R4701 Aphasia: Secondary | ICD-10-CM | POA: Diagnosis present

## 2022-07-21 DIAGNOSIS — I480 Paroxysmal atrial fibrillation: Secondary | ICD-10-CM | POA: Diagnosis present

## 2022-07-21 DIAGNOSIS — Z7901 Long term (current) use of anticoagulants: Secondary | ICD-10-CM | POA: Diagnosis not present

## 2022-07-21 DIAGNOSIS — I63342 Cerebral infarction due to thrombosis of left cerebellar artery: Secondary | ICD-10-CM | POA: Diagnosis not present

## 2022-07-21 DIAGNOSIS — I6310 Cerebral infarction due to embolism of unspecified precerebral artery: Secondary | ICD-10-CM | POA: Diagnosis not present

## 2022-07-21 DIAGNOSIS — Z8249 Family history of ischemic heart disease and other diseases of the circulatory system: Secondary | ICD-10-CM | POA: Diagnosis not present

## 2022-07-21 DIAGNOSIS — E7800 Pure hypercholesterolemia, unspecified: Secondary | ICD-10-CM | POA: Diagnosis present

## 2022-07-21 DIAGNOSIS — I10 Essential (primary) hypertension: Secondary | ICD-10-CM | POA: Diagnosis present

## 2022-07-21 LAB — BASIC METABOLIC PANEL
Anion gap: 11 (ref 5–15)
BUN: 5 mg/dL — ABNORMAL LOW (ref 8–23)
CO2: 25 mmol/L (ref 22–32)
Calcium: 8.4 mg/dL — ABNORMAL LOW (ref 8.9–10.3)
Chloride: 101 mmol/L (ref 98–111)
Creatinine, Ser: 0.84 mg/dL (ref 0.44–1.00)
GFR, Estimated: >60 mL/min (ref >60)
Glucose, Bld: 92 mg/dL (ref 70–99)
Potassium: 3.5 mmol/L (ref 3.5–5.1)
Sodium: 137 mmol/L (ref 135–145)

## 2022-07-21 LAB — POCT I-STAT, CHEM 8
BUN: 7 mg/dL — ABNORMAL LOW (ref 8–23)
Calcium, Ion: 1.14 mmol/L — ABNORMAL LOW (ref 1.15–1.40)
Chloride: 102 mmol/L (ref 98–111)
Creatinine, Ser: 0.9 mg/dL (ref 0.44–1.00)
Glucose, Bld: 91 mg/dL (ref 70–99)
HCT: 37 % (ref 36.0–46.0)
Hemoglobin: 12.6 g/dL (ref 12.0–15.0)
Potassium: 3.7 mmol/L (ref 3.5–5.1)
Sodium: 142 mmol/L (ref 135–145)
TCO2: 26 mmol/L (ref 22–32)

## 2022-07-21 LAB — ECHOCARDIOGRAM COMPLETE
Area-P 1/2: 3.21 cm2
Height: 60 in
MV M vel: 4.17 m/s
MV Peak grad: 69.4 mmHg
S' Lateral: 2.7 cm
Weight: 2836 oz

## 2022-07-21 LAB — CBC
HCT: 35.4 % — ABNORMAL LOW (ref 36.0–46.0)
Hemoglobin: 11.6 g/dL — ABNORMAL LOW (ref 12.0–15.0)
MCH: 28.4 pg (ref 26.0–34.0)
MCHC: 32.8 g/dL (ref 30.0–36.0)
MCV: 86.8 fL (ref 80.0–100.0)
Platelets: 246 10*3/uL (ref 150–400)
RBC: 4.08 MIL/uL (ref 3.87–5.11)
RDW: 14.6 % (ref 11.5–15.5)
WBC: 6.8 10*3/uL (ref 4.0–10.5)
nRBC: 0 % (ref 0.0–0.2)

## 2022-07-21 LAB — HEMOGLOBIN A1C
Hgb A1c MFr Bld: 5.7 % — ABNORMAL HIGH (ref 4.8–5.6)
Mean Plasma Glucose: 117 mg/dL

## 2022-07-21 LAB — ETHANOL: Alcohol, Ethyl (B): <10 mg/dL (ref <10)

## 2022-07-21 MED ORDER — ROSUVASTATIN CALCIUM 5 MG PO TABS
5.0000 mg | ORAL_TABLET | Freq: Every day | ORAL | Status: DC
  Administered 2022-07-21 – 2022-07-22 (×2): 5 mg via ORAL
  Filled 2022-07-21 (×2): qty 1

## 2022-07-21 MED ORDER — POTASSIUM CHLORIDE CRYS ER 20 MEQ PO TBCR
40.0000 meq | EXTENDED_RELEASE_TABLET | Freq: Once | ORAL | Status: AC
  Administered 2022-07-21: 40 meq via ORAL
  Filled 2022-07-21: qty 2

## 2022-07-21 MED ORDER — APIXABAN 5 MG PO TABS
5.0000 mg | ORAL_TABLET | Freq: Two times a day (BID) | ORAL | Status: DC
  Administered 2022-07-21 – 2022-07-23 (×5): 5 mg via ORAL
  Filled 2022-07-21 (×5): qty 1

## 2022-07-21 MED ORDER — ASPIRIN 81 MG PO TBEC
81.0000 mg | DELAYED_RELEASE_TABLET | Freq: Every day | ORAL | Status: DC
  Administered 2022-07-21 – 2022-07-23 (×3): 81 mg via ORAL
  Filled 2022-07-21 (×3): qty 1

## 2022-07-21 NOTE — Progress Notes (Signed)
SLP Cancellation Note  Patient Details Name: Sara Frye MRN: [id number] DOB: [birth year]   Cancelled treatment:       Reason Eval/Treat Not Completed: Other (comment). MD reordered SLP for swallow assessment though pt passed [facility] and tolerating POs. Will defer SLP swallow assessment at this time.    Mykal Batiz, Katherene Ponto 07/21/2022, 11:06 AM

## 2022-07-21 NOTE — Evaluation (Signed)
Occupational Therapy Evaluation Patient Details Name: Sara Frye MRN: [id number] DOB: 07/06/1929 Today's Date: 07/21/2022   History of Present Illness 88 y.o. female who presented 07/20/22 with difficulty talking, headache, and dizziness. CT head and CTA head and neck with no acute findings. MRI revealed small acute right cerebellar infarct  PMH significant of hypertension, hyperlipidemia, paroxysmal atrial fibrillation, CVA, cerebral aneurysm s/p coiling, and hypothyroidism   Clinical Impression   Pt typically independent in ADL and mobility. She does most IADL around the house, but does not drive. Today she is min guard assist for transfers and in room mobility with RW. Pt gets very dizzy with positional changes. She states this is normal and she typically takes medicine for it?!!?. SBP remained within 140-150 despite positional changes. Pt with decreased activity tolerance required sitting for grooming and bathing activities at the sink and DOE (SpO2 remained >95% throughout session on RA). Pt very talkative and tangential and states she is still having trouble getting words out. OT will continue to follow acutely and recommend [facility] to maximize safety and independence in ADL and functional transfers and return to PLOF.     Recommendations for follow up therapy are one component of a multi-disciplinary discharge planning process, led by the attending physician.  Recommendations may be updated based on patient status, additional functional criteria and insurance authorization.   Follow Up Recommendations  Home health OT     Assistance Recommended at Discharge Intermittent Supervision/Assistance  Patient can return home with the following A little help with walking and/or transfers;A little help with bathing/dressing/bathroom;Assistance with cooking/housework;Direct supervision/assist for medications management;Assist for transportation;Help with stairs or ramp for entrance     Functional Status Assessment  Patient has had a recent decline in their functional status and demonstrates the ability to make significant improvements in function in a reasonable and predictable amount of time.  Equipment Recommendations  None recommended by OT (Pt has appropriate DME)    Recommendations for Other Services Speech consult;PT consult     Precautions / Restrictions Precautions Precautions: Fall Precaution Comments: gets dizzy with positional changes (- for BP changes) Restrictions Weight Bearing Restrictions: No      Mobility Bed Mobility Overal bed mobility: Needs Assistance Bed Mobility: Supine to Sit     Supine to sit: Min assist, HOB elevated     General bed mobility comments: dizziness with positional  changes, BP remains same    Transfers Overall transfer level: Needs assistance Equipment used: Rolling walker (2 wheels) Transfers: Sit to/from Stand Sit to Stand: Min guard           General transfer comment: extra time and effort - no physical assist from therapist      Balance Overall balance assessment: Mild deficits observed, not formally tested                                         ADL either performed or assessed with clinical judgement   ADL Overall ADL's : Needs assistance/impaired Eating/Feeding: Modified independent   Grooming: Wash/dry face;Oral care;Applying deodorant;Brushing hair;Set up;Sitting Grooming Details (indicate cue type and reason): at sink Upper Body Bathing: Min guard;Standing   Lower Body Bathing: Min guard;Sit to/from stand Lower Body Bathing Details (indicate cue type and reason): dizziness Upper Body Dressing : Set up;Sitting Upper Body Dressing Details (indicate cue type and reason): extra gown Lower Body Dressing: Min guard;Sitting/lateral leans  Toilet Transfer: Min guard;Minimal assistance;Ambulation;Rolling walker (2 wheels) Toilet Transfer Details (indicate cue type and reason):  cues for safety with RW Toileting- Clothing Manipulation and Hygiene: Min guard;Sit to/from stand       Functional mobility during ADLs: Min guard;Minimal assistance;Rolling walker (2 wheels);Cueing for safety;Cueing for sequencing General ADL Comments: decreased awareness, cues for safety with RW, problem solving, dizziness with positional changes     Vision         Perception     Praxis      Pertinent Vitals/Pain Pain Assessment Pain Assessment: No/denies pain     Hand Dominance Right   Extremity/Trunk Assessment Upper Extremity Assessment Upper Extremity Assessment: Generalized weakness (symmetrical, also tremor like movements noted)   Lower Extremity Assessment Lower Extremity Assessment: Defer to PT evaluation   Cervical / Trunk Assessment Cervical / Trunk Assessment: Kyphotic   Communication Communication Communication: Expressive difficulties   Cognition Arousal/Alertness: Awake/alert Behavior During Therapy: WFL for tasks assessed/performed (talkative) Overall Cognitive Status: No family/caregiver present to determine baseline cognitive functioning                                 General Comments: tangential, can recall information - decreased problem solving. unsure of baseline     General Comments  SBP remained 150's no matter what position she was in. DOE - but SpO2 remained >95% throughout session    Exercises     Shoulder Instructions      Home Living Family/patient expects to be discharged to:: Private residence Living Arrangements: Spouse/significant other ([place] is [place] few blocks away) Available Help at Discharge: Family;Available 24 hours/day Type of Home: House Home Access: Stairs to enter [employer] of Steps: 3 Entrance Stairs-Rails: Can reach both Home Layout: Multi-level;Able to live on main level with bedroom/bathroom     Bathroom Shower/Tub: Occupational psychologist: Standard Bathroom  Accessibility: Yes How Accessible: Accessible via walker Home Equipment: Rollator (4 wheels);Shower seat - built in;Hand held shower head;Grab bars - tub/shower          Prior Functioning/Environment Prior Level of Function : Independent/Modified Independent (does not drive anymore)             Mobility Comments: uses walker ADLs Comments: doing own cooking/cleaning and ADL        OT Problem List: Decreased activity tolerance;Impaired balance (sitting and/or standing);Decreased safety awareness      OT Treatment/Interventions: Self-care/ADL training;Energy conservation;DME and/or AE instruction;Therapeutic activities;Cognitive remediation/compensation;Patient/family education;Balance training    OT Goals(Current goals can be found in the care plan section) Acute Rehab OT Goals Patient Stated Goal: talk better OT Goal Formulation: With patient Time For Goal Achievement: 08/04/22 Potential to Achieve Goals: Good ADL Goals Pt Will Perform Grooming: with modified independence;standing Pt Will Perform Upper Body Dressing: with modified independence;sitting Pt Will Perform Lower Body Dressing: with supervision;sit to/from stand Pt Will Transfer to Toilet: with modified independence;ambulating Pt Will Perform Toileting - Clothing Manipulation and hygiene: with modified independence;sit to/from stand Additional ADL Goal #1: Pt will verbalize at least 3 strategies for energy conservation in ADL with no cues  OT Frequency: Min 2X/week    Co-evaluation              AM-PAC OT "6 Clicks" Daily Activity     Outcome Measure Help from another person eating meals?: A Little Help from another person taking care of personal grooming?: A Little Help from another  person toileting, which includes using toliet, bedpan, or urinal?: A Little Help from another person bathing (including washing, rinsing, drying)?: A Little Help from another person to put on and taking off regular upper body  clothing?: A Little Help from another person to put on and taking off regular lower body clothing?: A Little 6 Click Score: 18   End of Session Equipment Utilized During Treatment: Gait belt;Rolling walker (2 wheels) Nurse Communication: Mobility status;Other (comment) (no purewick (told RN and NT))  Activity Tolerance: Patient tolerated treatment well Patient left: in chair;with call bell/phone within reach;with chair alarm set;Other (comment) (SLP in room)  OT Visit Diagnosis: Other abnormalities of gait and mobility (R26.89);Unsteadiness on feet (R26.81);Other symptoms and signs involving the nervous system (R29.898)                Time: 6967-8938 OT Time Calculation (min): 31 min Charges:  OT General Charges $OT Visit: 1 Visit OT Evaluation $OT Eval Moderate Complexity: 1 Mod OT Treatments $Self Care/Home Management : 8-22 mins  Jesse Sans OTR/L Acute Rehabilitation Services Office: [place] 07/21/2022, 10:53 AM

## 2022-07-21 NOTE — Progress Notes (Addendum)
STROKE TEAM PROGRESS NOTE   ATTENDING NOTE: I reviewed above note and agree with the assessment and plan. Pt was seen and examined.   88 year old female with history of A-fib on Eliquis, hypertension, hyperlipidemia, BPPV, stroke in 11/2015, ACOM aneurysm status post coiling admitted for headache, vertigo, slurred speech.  CT no acute abnormality.  CTA head and neck right V4 occlusion post PICA.  MRI showed right cerebellum near peduncle small infarct.  LDL 84, A1c pending, EF 65 to 70%, creatinine 0.84.  On exam, patient lying in bed, no family at bedside.  Patient awake alert orientated x3, no aphasia, fluent language, follows simple commands, able to name repeat.  Cranial nerve intact, no nystagmus, eyes disconjugate.  Moving all extremities, sensation symmetrical, no ataxia.  Etiology for patient stroke likely due to right V4 occlusion.  Patient currently on Eliquis for A-fib stroke prevention, will add aspirin 81 on top of Eliquis for further stroke prevention in the setting of large vessel disease.  Increase Crestor from 2.5-5.  PT/OT recommend home health.  For detailed assessment and plan, please refer to above/below as I have made changes wherever appropriate.   Neurology will sign off. Please call with questions. Pt will follow up with stroke clinic NP at [facility] in about 4 weeks. Thanks for the consult.   Sara Hawking, MD PhD Stroke Neurology 07/21/2022 7:27 PM    INTERVAL HISTORY Patient seen in her room with no family at the bedside.  Yesterday morning, she experienced a headache and aphasia along with slurred speech.  She continues to have some stuttering of speech with infrequent paraphasic errors.  MRI reveals small right cerebellar infarct.  Vitals:   07/21/22 0334 07/21/22 0717 07/21/22 0724 07/21/22 1155  BP:   (!) 131/55 (!) 146/97  Pulse:  77 64 60  Resp:  '18 14 16  '$ Temp: 97.6 F (36.4 C)  98.3 F (36.8 C) 97.8 F (36.6 C)  TempSrc: Oral Oral Oral Axillary  SpO2:   97% 97% 100%  Weight:      Height:       CBC:  Recent Labs  Lab 07/20/22 1429 07/21/22 0445  WBC 5.9 6.8  NEUTROABS 3.4  --   HGB 12.1 11.6*  HCT 36.5 35.4*  MCV 86.5 86.8  PLT 245 793   Basic Metabolic Panel:  Recent Labs  Lab 07/20/22 1429 07/21/22 0445  NA 139 137  K 3.7 3.5  CL 105 101  CO2 23 25  GLUCOSE 83 92  BUN 7* 5*  CREATININE 0.84 0.84  CALCIUM 9.0 8.4*   Lipid Panel:  Recent Labs  Lab 07/20/22 1429  CHOL 156  TRIG 153*  HDL 41  CHOLHDL 3.8  VLDL 31  LDLCALC 84   HgbA1c: No results for input(s): "HGBA1C" in the last 168 hours. Urine Drug Screen: No results for input(s): "LABOPIA", "COCAINSCRNUR", "LABBENZ", "AMPHETMU", "THCU", "LABBARB" in the last 168 hours.  Alcohol Level  Recent Labs  Lab 07/21/22 0445  ETH <10    IMAGING past 24 hours MR BRAIN WO CONTRAST  Result Date: 07/20/2022 CLINICAL DATA:  Neuro deficit, acute, stroke suspected. Aphasia and headache. EXAM: MRI HEAD WITHOUT CONTRAST TECHNIQUE: Multiplanar, multiecho pulse sequences of the brain and surrounding structures were obtained without intravenous contrast. COMPARISON:  Head CT and CTA 07/20/2022.  Head MRI 05/03/2020. FINDINGS: Brain: There is a 1 cm acute infarct in the right cerebellar hemisphere. No acute supratentorial infarct, intracranial hemorrhage, mass, midline shift, or extra-axial fluid collection is identified.  T2 hyperintensities in the cerebral white matter bilaterally are unchanged from the prior MRI and are nonspecific but compatible with moderate chronic small vessel ischemic disease. Small chronic bilateral parietal and left cerebellar infarcts are also unchanged. Cerebral atrophy is mild for age. Vascular: More fully evaluated on today's CTA. Skull and upper cervical spine: No suspicious marrow lesion. Asymmetrically advanced left-sided facet arthrosis in the upper cervical spine. Sinuses/Orbits: Left cataract extraction. Mild mucosal thickening in the ethmoid and  sphenoid sinuses. No significant mastoid fluid. Other: None. IMPRESSION: 1. Small acute right cerebellar infarct. 2. Moderate chronic small vessel ischemic disease. Electronically Signed   By: Logan Bores M.D.   On: 07/20/2022 18:26   CT HEAD CODE STROKE WO CONTRAST  Result Date: 07/20/2022 CLINICAL DATA:  Code stroke.  TIA.  Headache and slurred speech. EXAM: CT ANGIOGRAPHY HEAD AND NECK TECHNIQUE: Multidetector CT imaging of the head and neck was performed using the standard protocol during bolus administration of intravenous contrast. Multiplanar CT image reconstructions and MIPs were obtained to evaluate the vascular anatomy. Carotid stenosis measurements (when applicable) are obtained utilizing NASCET criteria, using the distal internal carotid diameter as the denominator. RADIATION DOSE REDUCTION: This exam was performed according to the departmental dose-optimization program which includes automated exposure control, adjustment of the mA and/or kV according to patient size and/or use of iterative reconstruction technique. CONTRAST:  68m OMNIPAQUE IOHEXOL 350 MG/ML SOLN COMPARISON:  MR and MRA head 05/03/2020 FINDINGS: CT HEAD FINDINGS Brain: There is no acute intracranial hemorrhage, extra-axial fluid collection, or acute infarct Parenchymal volume is within expected limits for age. The ventricles are normal in size. There is a small area of cortical encephalomalacia in the high left parietal lobe, unchanged. Additional patchy and confluence hypodensity in the supratentorial white matter likely reflects sequela of chronic small-vessel ischemic change. There is no mass lesion. There is no mass effect or midline shift. Vascular: Postsurgical changes reflecting prior A-comm aneurysm treatment are noted. No hyperdense vessel is seen. Skull: Normal. Negative for fracture or focal lesion. Sinuses/Orbits: The paranasal sinuses are clear. A left lens implant is noted. The globes and orbits are otherwise  unremarkable. Other: None. ASPECTS ([facility] - Ganglionic level infarction (caudate, lentiform nuclei, internal capsule, insula, M1-M3 cortex): 7 - Supraganglionic infarction (M4-M6 cortex): 3 Total score (0-10 with 10 being normal): 10 CTA NECK FINDINGS Aortic arch: There is mild calcified plaque in the imaged aortic arch. The origins of the major branch vessels are patent. The subclavian arteries are patent to the level imaged. Right carotid system: The right common, internal, and external carotid arteries are patent, with mild plaque at the bifurcation but no hemodynamically significant stenosis or occlusion. There is no dissection or aneurysm. Left carotid system: The left common, internal, and external carotid arteries are patent, with mild plaque at the bifurcation but no hemodynamically significant stenosis or occlusion. There is no dissection or aneurysm. Vertebral arteries: The vertebral arteries are patent, without hemodynamically significant stenosis or occlusion. There is no dissection or aneurysm. Skeleton: There is overall mild for age degenerative change of the cervical spine. There is no acute osseous abnormality or suspicious osseous lesion. There is no visible canal hematoma. Other neck: The soft tissues of the neck are unremarkable. Upper chest: The imaged lung apices are clear. Review of the MIP images confirms the above findings CTA HEAD FINDINGS Anterior circulation: There is mild calcified plaque in the carotid siphons without hemodynamically significant stenosis. The bilateral MCAs are patent,  without proximal stenosis or occlusion. 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 Streak artifact degrades evaluation of the surrounding vasculature. The left A1 segment is patent. The ACA is beyond the stent and coil pack appear patent, without proximal high-grade stenosis or occlusion.  There is no definite evidence of residual or recurrent aneurysm. There is no new aneurysm. Posterior circulation: The right V4 segment is occluded after the PICA origin, similar to the prior MRA. The left V4 segment is widely patent. PICA is identified bilaterally. The basilar artery is patent. The bilateral PCAs are patent, without proximal stenosis or occlusion. Bilateral posterior communicating arteries are identified. There is no aneurysm or AVM. Venous sinuses: As permitted by contrast timing, patent. Anatomic variants: None. Review of the MIP images confirms the above findings IMPRESSION: 1. No acute intracranial pathology. 2. No emergent large vessel occlusion. 3. 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 4. Mild calcified plaque at the carotid bifurcations and siphons without hemodynamically significant stenosis or occlusion. 5. 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 No definite evidence of recurrent or residual aneurysm though evaluation is degraded, by streak artifact. Findings were discussed with Dr. Curly Shores at 3:02pm. Electronically Signed   By: Valetta Mole M.D.   On: 07/20/2022 15:17   CT ANGIO HEAD NECK W WO CM (CODE STROKE)  Result Date: 07/20/2022 CLINICAL DATA:  Code stroke.  TIA.  Headache and slurred speech. EXAM: CT ANGIOGRAPHY HEAD AND NECK TECHNIQUE: Multidetector CT imaging of the head and neck was performed using the standard protocol during bolus administration of intravenous contrast. Multiplanar CT image reconstructions and MIPs were obtained to evaluate the vascular anatomy. Carotid stenosis measurements (when applicable) are obtained utilizing NASCET criteria, using the distal internal carotid diameter as the denominator. RADIATION DOSE REDUCTION: This exam was performed according to the departmental dose-optimization program which includes automated exposure control, adjustment  of the mA and/or kV according to patient size and/or use of iterative reconstruction technique. CONTRAST:  12m OMNIPAQUE IOHEXOL 350 MG/ML SOLN COMPARISON:  MR and MRA head 05/03/2020 FINDINGS: CT HEAD FINDINGS Brain: There is no acute intracranial hemorrhage, extra-axial fluid collection, or acute infarct Parenchymal volume is within expected limits for age. The ventricles are normal in size. There is a small area of cortical encephalomalacia in the high left parietal lobe, unchanged. Additional patchy and confluence hypodensity in the supratentorial white matter likely reflects sequela of chronic small-vessel ischemic change. There is no mass lesion. There is no mass effect or midline shift. Vascular: Postsurgical changes reflecting prior A-comm aneurysm treatment are noted. No hyperdense vessel is seen. Skull: Normal. Negative for fracture or focal lesion. Sinuses/Orbits: The paranasal sinuses are clear. A left lens implant is noted. The globes and orbits are otherwise unremarkable. Other: None. ASPECTS ([facility] CT Score) - Ganglionic level infarction (caudate, lentiform nuclei, internal capsule, insula, M1-M3 cortex): 7 - Supraganglionic infarction (M4-M6 cortex): 3 Total score (0-10 with 10 being normal): 10 CTA NECK FINDINGS Aortic arch: There is mild calcified plaque in the imaged aortic arch. The origins of the major branch vessels are patent. The subclavian arteries are patent to the level imaged. Right carotid system: The right common, internal, and external carotid arteries are patent, with mild plaque at the bifurcation but no hemodynamically significant stenosis or occlusion. There is no dissection or aneurysm. Left carotid system: The left common, internal, and external  carotid arteries are patent, with mild plaque at the bifurcation but no hemodynamically significant stenosis or occlusion. There is no dissection or aneurysm. Vertebral arteries: The vertebral arteries are patent,  without hemodynamically significant stenosis or occlusion. There is no dissection or aneurysm. Skeleton: There is overall mild for age degenerative change of the cervical spine. There is no acute osseous abnormality or suspicious osseous lesion. There is no visible canal hematoma. Other neck: The soft tissues of the neck are unremarkable. Upper chest: The imaged lung apices are clear. Review of the MIP images confirms the above findings CTA HEAD FINDINGS Anterior circulation: There is mild calcified plaque in the carotid siphons without hemodynamically significant stenosis. The bilateral MCAs are patent, without proximal stenosis or occlusion. 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 Streak artifact degrades evaluation of the surrounding vasculature. The left A1 segment is patent. The ACA is beyond the stent and coil pack appear patent, without proximal high-grade stenosis or occlusion. There is no definite evidence of residual or recurrent aneurysm. There is no new aneurysm. Posterior circulation: The right V4 segment is occluded after the PICA origin, similar to the prior MRA. The left V4 segment is widely patent. PICA is identified bilaterally. The basilar artery is patent. The bilateral PCAs are patent, without proximal stenosis or occlusion. Bilateral posterior communicating arteries are identified. There is no aneurysm or AVM. Venous sinuses: As permitted by contrast timing, patent. Anatomic variants: None. Review of the MIP images confirms the above findings IMPRESSION: 1. No acute intracranial pathology. 2. No emergent large vessel occlusion. 3. 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 4. Mild calcified plaque at the carotid bifurcations and siphons without hemodynamically significant stenosis or occlusion. 5. 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 No definite evidence of recurrent or residual aneurysm though evaluation is degraded, by streak artifact. Findings were discussed with Dr. Curly Shores at 3:02pm. Electronically Signed   By: Valetta Mole M.D.   On: 07/20/2022 15:17    PHYSICAL EXAM General: Alert, well-nourished, well-developed elderly patient in no acute distress Respiratory: Regular, unlabored respirations on room air  NEURO:  Mental Status: AA&Ox3  Speech/Language: speech is with mild aphasia, stuttering speech and infrequent paraphasic errors.  Fluency and comprehension intact  Cranial Nerves:  II: PERRL.  III, IV, VI: EOMI. Eyelids elevate symmetrically.  V: Sensation is intact to light touch and symmetrical to face.  VII: Smile is symmetrical.  VIII: hearing intact to voice. IX, X: Phonation is normal.  IP:[id number] shrug 5/5. XII: tongue is midline without fasciculations. Motor: 5/5 strength to all muscle groups tested.  Tone: is normal and bulk is normal Sensation- Intact to light touch bilaterally, but greater in right foot than left foot Coordination: FTN intact bilaterally, HKS: Unable to perform in chair.No drift.  Gait- deferred   ASSESSMENT/PLAN Ms. Manuelita P Hobday is a 88 y.o. female with history of atrial fibrillation on Eliquis, hypertension, hyperlipidemia, stroke, cerebral aneurysm status post coiling presenting with headache and aphasia along with slurred speech.  She continues to have some stuttering of speech with infrequent paraphasic errors.  MRI reveals small right cerebellar infarct.  Stroke:  right cerebellar peduncle infarct, Etiology: large vessel disease with right V4 occlusion Code Stroke CT head No acute abnormality. ASPECTS 10.    CTA head & neck no emergent LVO, unchanged occlusion of right V4 segment, status post coil embolization of  right ACA aneurysm MRI small right cerebellar acute infarct 2D Echo pending LDL 84 HgbA1c pending VTE prophylaxis -fully  anticoagulated with Eliquis Eliquis (apixaban) daily prior to admission, now on Eliquis (apixaban) daily and ASA 81. Continue both on discharge.   Therapy recommendations: Home health OT, PT pending Disposition: Pending  Atrial fibrillation Patient has long history of atrial fibrillation We will continue home anticoagulation with Eliquis given small size of stroke  Hypertension Home meds: Lisinopril 10 mg daily, hydrochlorothiazide 12.5 mg daily Stable Long-term BP goal normotensive  Hyperlipidemia Home meds: Rosuvastatin 2.5 mg daily, increase to 5 mg daily LDL 84, goal < 70 High intensity statin not indicated due to advanced age and LDL being near goal Continue statin at discharge  Other Stroke Risk Factors Advanced Age >/= 45  Obesity, Body mass index is 34.62 kg/m., BMI >/= 30 associated with increased stroke risk, recommend weight loss, diet and exercise as appropriate  Hx stroke  Other Active Problems None  Hospital day # 0  Lyman , MSN, AGACNP-BC [facility] See Amion for schedule and pager information 07/21/2022 12:44 PM    To contact Stroke Continuity provider, please refer to [email or web address] After hours, contact General Neurology

## 2022-07-21 NOTE — Progress Notes (Signed)
  Echocardiogram 2D Echocardiogram has been performed.  Sara Frye 07/21/2022, 5:30 PM

## 2022-07-21 NOTE — Progress Notes (Signed)
   07/21/22 1435  Clinical Encounter Type  Visited With Patient not available  Visit Type Initial;Spiritual support  Referral From Nurse  Consult/Referral To Chaplain   Chaplain responded to a spiritual consult for prayer. The patient, Sara Frye, was receiving care at the time of my visit. I will revisit tomorrow if time allows.   Danice Goltz [facility] [address]  [phone]

## 2022-07-21 NOTE — Progress Notes (Signed)
PT Cancellation Note  Patient Details Name: Sara Frye MRN: [id number] DOB: Jan 12, 1929   Cancelled Treatment:    Reason Eval/Treat Not Completed: Patient at procedure or test/unavailable  Patient initially with OT and now working with Speech Therapy.   [place]  Office [phone]  Rexanne Mano 07/21/2022, 10:22 AM

## 2022-07-21 NOTE — Progress Notes (Signed)
[facility] PROGRESS NOTE   Sara Frye TMA:[id number] DOB: 1929-06-10 DOA: 07/20/2022  PCP: Deland Pretty, MD  Brief History/Interval Summary: 88 y.o. female with medical history significant of hypertension, hyperlipidemia, paroxysmal atrial fibrillation, CVA, cerebral aneurysm s/p coiling, and hypothyroidism who presented with difficulty talking at 25 AM on 07/21/22.  Reported associated symptoms of mild headache and dizziness. Her daughter had left the patient in her normal state of health at around 10 AM to take her stepfather to a doctor's appointment.  She chronically has abdominal pain and back pain.  Her daughter makes note that she had been switched from metoprolol to lisinopril on 11/21 by cardiology after being found to be bradycardic with heart rates in 40s.  She was seen as a code stroke.  Was hospitalized for further management.  Consultants: Neurology  Procedures: None yet    Subjective/Interval History: Patient continues to have some difficulty talking.  Denies any headaches at this time.  Some left-sided weakness is present.    Assessment/Plan:  Acute stroke with aphasia MRI showed right cerebellar infarct.  She has noted to have some subtle left-sided weakness this morning.  Continues to have difficulty speaking. Patient noted to be on apixaban prior to admission.  Has been resumed by neurology.  Also noted to be on aspirin. Noted to be on Crestor. LDL is 84.  HbA1c is pending. Patient to be seen by PT and OT.   Echocardiogram has been ordered per neurology. CT angiogram did not show any large vessel occlusion.  Paroxysmal atrial fibrillation On Eliquis prior to admission.  Resumed by neurology this morning.  She was recently taken off of metoprolol for bradycardia.  Monitor on telemetry for now.  Normocytic anemia Drop in hemoglobin is likely dilutional.  No evidence of overt bleeding.  Essential hypertension Initial blood pressures were quite  elevated.  Currently allowing permissive hypertension.  Hyperlipidemia LDL 84.  Noted to be on Crestor.  Will likely need to increase the dose of Crestor to achieve goal LDL.  Will wait for neurology input first.  Previous history of stroke As above.  History of cerebral aneurysm She is status post endovascular coiling of the anterior communicating artery aneurysm in 2018.  Hypothyroidism Continue levothyroxine.  TSH is normal at 1.7.  Obesity Estimated body mass index is 34.62 kg/m as calculated from the following:   Height as of this encounter: 5' (1.524 m).   Weight as of this encounter: 80.4 kg.   DVT Prophylaxis: On Eliquis Code Status: Full code Family Communication: Discussed with patient Disposition Plan: To be determined  Status is: Observation The patient will require care spanning > 2 midnights and should be moved to inpatient because: Acute stroke      Medications: Scheduled:   stroke: early stages of recovery book   Does not apply Once   apixaban  5 mg Oral BID   aspirin EC  81 mg Oral Daily   gabapentin  100 mg Oral Daily   gabapentin  200 mg Oral QHS   levothyroxine  25 mcg Oral Daily   rosuvastatin  2.5 mg Oral QHS   sertraline  50 mg Oral Daily   Continuous: YBW:[id number], hydrALAZINE, traMADol  Antibiotics: Anti-infectives (From admission, onward)    None       Objective:  Vital Signs  Vitals:   07/21/22 0034 07/21/22 0334 07/21/22 0717 07/21/22 0724  BP:    (!) 131/55  Pulse:   77 64  Resp:   18 14  Temp: (!) 97.5 F (36.4 C) 97.6 F (36.4 C)  98.3 F (36.8 C)  TempSrc: Oral Oral Oral Oral  SpO2:   97% 97%  Weight:      Height:       No intake or output data in the 24 hours ending 07/21/22 0925 Filed Weights   07/20/22 2053  Weight: 80.4 kg    General appearance: Awake alert.  In no distress Resp: Clear to auscultation bilaterally.  Normal effort Cardio: S1-S2 is normal regular.  No S3-S4.  No rubs murmurs or  bruit GI: Abdomen is soft.  Nontender nondistended.  Bowel sounds are present normal.  No masses organomegaly Extremities: No edema.  Full range of motion of lower extremities. Neurologic: Aphasia noted.  Left-sided weakness noted.   Lab Results:  Data Reviewed: I have personally reviewed following labs and reports of the imaging studies  CBC: Recent Labs  Lab 07/20/22 1429 07/21/22 0445  WBC 5.9 6.8  NEUTROABS 3.4  --   HGB 12.1 11.6*  HCT 36.5 35.4*  MCV 86.5 86.8  PLT 245 782    Basic Metabolic Panel: Recent Labs  Lab 07/20/22 1429 07/21/22 0445  NA 139 137  K 3.7 3.5  CL 105 101  CO2 23 25  GLUCOSE 83 92  BUN 7* 5*  CREATININE 0.84 0.84  CALCIUM 9.0 8.4*    GFR: Estimated Creatinine Clearance: 41 mL/min (by C-G formula based on SCr of 0.84 mg/dL).  Liver Function Tests: Recent Labs  Lab 07/20/22 1429  AST 28  ALT 18  ALKPHOS 47  BILITOT 0.4  PROT 6.8  ALBUMIN 3.8      Coagulation Profile: Recent Labs  Lab 07/20/22 1429  INR 1.3*     Lipid Profile: Recent Labs    07/20/22 1429  CHOL 156  HDL 41  LDLCALC 84  TRIG 153*  CHOLHDL 3.8    Thyroid Function Tests: Recent Labs    07/20/22 1429  TSH 1.715      Radiology Studies: MR BRAIN WO CONTRAST  Result Date: 07/20/2022 CLINICAL DATA:  Neuro deficit, acute, stroke suspected. Aphasia and headache. EXAM: MRI HEAD WITHOUT CONTRAST TECHNIQUE: Multiplanar, multiecho pulse sequences of the brain and surrounding structures were obtained without intravenous contrast. COMPARISON:  Head CT and CTA 07/20/2022.  Head MRI 05/03/2020. FINDINGS: Brain: There is a 1 cm acute infarct in the right cerebellar hemisphere. No acute supratentorial infarct, intracranial hemorrhage, mass, midline shift, or extra-axial fluid collection is identified. T2 hyperintensities in the cerebral white matter bilaterally are unchanged from the prior MRI and are nonspecific but compatible with moderate chronic small  vessel ischemic disease. Small chronic bilateral parietal and left cerebellar infarcts are also unchanged. Cerebral atrophy is mild for age. Vascular: More fully evaluated on today's CTA. Skull and upper cervical spine: No suspicious marrow lesion. Asymmetrically advanced left-sided facet arthrosis in the upper cervical spine. Sinuses/Orbits: Left cataract extraction. Mild mucosal thickening in the ethmoid and sphenoid sinuses. No significant mastoid fluid. Other: None. IMPRESSION: 1. Small acute right cerebellar infarct. 2. Moderate chronic small vessel ischemic disease. Electronically Signed   By: Logan Bores M.D.   On: 07/20/2022 18:26   CT HEAD CODE STROKE WO CONTRAST  Result Date: 07/20/2022 CLINICAL DATA:  Code stroke.  TIA.  Headache and slurred speech. EXAM: CT ANGIOGRAPHY HEAD AND NECK TECHNIQUE: Multidetector CT imaging of the head and neck was performed using the standard protocol during bolus administration of intravenous contrast. Multiplanar CT image reconstructions and MIPs  were obtained to evaluate the vascular anatomy. Carotid stenosis measurements (when applicable) are obtained utilizing NASCET criteria, using the distal internal carotid diameter as the denominator. RADIATION DOSE REDUCTION: This exam was performed according to the departmental dose-optimization program which includes automated exposure control, adjustment of the mA and/or kV according to patient size and/or use of iterative reconstruction technique. CONTRAST:  21m OMNIPAQUE IOHEXOL 350 MG/ML SOLN COMPARISON:  MR and MRA head 05/03/2020 FINDINGS: CT HEAD FINDINGS Brain: There is no acute intracranial hemorrhage, extra-axial fluid collection, or acute infarct Parenchymal volume is within expected limits for age. The ventricles are normal in size. There is a small area of cortical encephalomalacia in the high left parietal lobe, unchanged. Additional patchy and confluence hypodensity in the supratentorial white matter likely  reflects sequela of chronic small-vessel ischemic change. There is no mass lesion. There is no mass effect or midline shift. Vascular: Postsurgical changes reflecting prior A-comm aneurysm treatment are noted. No hyperdense vessel is seen. Skull: Normal. Negative for fracture or focal lesion. Sinuses/Orbits: The paranasal sinuses are clear. A left lens implant is noted. The globes and orbits are otherwise unremarkable. Other: None. ASPECTS ([facility]) - Ganglionic level infarction (caudate, lentiform nuclei, internal capsule, insula, M1-M3 cortex): 7 - Supraganglionic infarction (M4-M6 cortex): 3 Total score (0-10 with 10 being normal): 10 CTA NECK FINDINGS Aortic arch: There is mild calcified plaque in the imaged aortic arch. The origins of the major branch vessels are patent. The subclavian arteries are patent to the level imaged. Right carotid system: The right common, internal, and external carotid arteries are patent, with mild plaque at the bifurcation but no hemodynamically significant stenosis or occlusion. There is no dissection or aneurysm. Left carotid system: The left common, internal, and external carotid arteries are patent, with mild plaque at the bifurcation but no hemodynamically significant stenosis or occlusion. There is no dissection or aneurysm. Vertebral arteries: The vertebral arteries are patent, without hemodynamically significant stenosis or occlusion. There is no dissection or aneurysm. Skeleton: There is overall mild for age degenerative change of the cervical spine. There is no acute osseous abnormality or suspicious osseous lesion. There is no visible canal hematoma. Other neck: The soft tissues of the neck are unremarkable. Upper chest: The imaged lung apices are clear. Review of the MIP images confirms the above findings CTA HEAD FINDINGS Anterior circulation: There is mild calcified plaque in the carotid siphons without hemodynamically significant  stenosis. The bilateral MCAs are patent, without proximal stenosis or occlusion. 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 Streak artifact degrades evaluation of the surrounding vasculature. The left A1 segment is patent. The ACA is beyond the stent and coil pack appear patent, without proximal high-grade stenosis or occlusion. There is no definite evidence of residual or recurrent aneurysm. There is no new aneurysm. Posterior circulation: The right V4 segment is occluded after the PICA origin, similar to the prior MRA. The left V4 segment is widely patent. PICA is identified bilaterally. The basilar artery is patent. The bilateral PCAs are patent, without proximal stenosis or occlusion. Bilateral posterior communicating arteries are identified. There is no aneurysm or AVM. Venous sinuses: As permitted by contrast timing, patent. Anatomic variants: None. Review of the MIP images confirms the above findings IMPRESSION: 1. No acute intracranial pathology. 2. No emergent large vessel occlusion. 3. 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 4. Mild calcified plaque at the carotid  bifurcations and siphons without hemodynamically significant stenosis or occlusion. 5. 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 No definite evidence of recurrent or residual aneurysm though evaluation is degraded, by streak artifact. Findings were discussed with Dr. Curly Shores at 3:02pm. Electronically Signed   By: Valetta Mole M.D.   On: 07/20/2022 15:17   CT ANGIO HEAD NECK W WO CM (CODE STROKE)  Result Date: 07/20/2022 CLINICAL DATA:  Code stroke.  TIA.  Headache and slurred speech. EXAM: CT ANGIOGRAPHY HEAD AND NECK TECHNIQUE: Multidetector CT imaging of the head and neck was performed using the standard protocol during bolus administration of intravenous  contrast. Multiplanar CT image reconstructions and MIPs were obtained to evaluate the vascular anatomy. Carotid stenosis measurements (when applicable) are obtained utilizing NASCET criteria, using the distal internal carotid diameter as the denominator. RADIATION DOSE REDUCTION: This exam was performed according to the departmental dose-optimization program which includes automated exposure control, adjustment of the mA and/or kV according to patient size and/or use of iterative reconstruction technique. CONTRAST:  64m OMNIPAQUE IOHEXOL 350 MG/ML SOLN COMPARISON:  MR and MRA head 05/03/2020 FINDINGS: CT HEAD FINDINGS Brain: There is no acute intracranial hemorrhage, extra-axial fluid collection, or acute infarct Parenchymal volume is within expected limits for age. The ventricles are normal in size. There is a small area of cortical encephalomalacia in the high left parietal lobe, unchanged. Additional patchy and confluence hypodensity in the supratentorial white matter likely reflects sequela of chronic small-vessel ischemic change. There is no mass lesion. There is no mass effect or midline shift. Vascular: Postsurgical changes reflecting prior A-comm aneurysm treatment are noted. No hyperdense vessel is seen. Skull: Normal. Negative for fracture or focal lesion. Sinuses/Orbits: The paranasal sinuses are clear. A left lens implant is noted. The globes and orbits are otherwise unremarkable. Other: None. ASPECTS ([facility] Score) - Ganglionic level infarction (caudate, lentiform nuclei, internal capsule, insula, M1-M3 cortex): 7 - Supraganglionic infarction (M4-M6 cortex): 3 Total score (0-10 with 10 being normal): 10 CTA NECK FINDINGS Aortic arch: There is mild calcified plaque in the imaged aortic arch. The origins of the major branch vessels are patent. The subclavian arteries are patent to the level imaged. Right carotid system: The right common, internal, and external carotid arteries  are patent, with mild plaque at the bifurcation but no hemodynamically significant stenosis or occlusion. There is no dissection or aneurysm. Left carotid system: The left common, internal, and external carotid arteries are patent, with mild plaque at the bifurcation but no hemodynamically significant stenosis or occlusion. There is no dissection or aneurysm. Vertebral arteries: The vertebral arteries are patent, without hemodynamically significant stenosis or occlusion. There is no dissection or aneurysm. Skeleton: There is overall mild for age degenerative change of the cervical spine. There is no acute osseous abnormality or suspicious osseous lesion. There is no visible canal hematoma. Other neck: The soft tissues of the neck are unremarkable. Upper chest: The imaged lung apices are clear. Review of the MIP images confirms the above findings CTA HEAD FINDINGS Anterior circulation: There is mild calcified plaque in the carotid siphons without hemodynamically significant stenosis. The bilateral MCAs are patent, without proximal stenosis or occlusion. 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 Streak artifact degrades evaluation of the surrounding vasculature. The left A1 segment is patent. The ACA is beyond the stent and coil pack appear patent, without proximal high-grade stenosis or occlusion.  There is no definite evidence of residual or recurrent aneurysm. There is no new aneurysm. Posterior circulation: The right V4 segment is occluded after the PICA origin, similar to the prior MRA. The left V4 segment is widely patent. PICA is identified bilaterally. The basilar artery is patent. The bilateral PCAs are patent, without proximal stenosis or occlusion. Bilateral posterior communicating arteries are identified. There is no aneurysm or AVM. Venous sinuses: As permitted by contrast timing, patent. Anatomic variants: None. Review  of the MIP images confirms the above findings IMPRESSION: 1. No acute intracranial pathology. 2. No emergent large vessel occlusion. 3. 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 4. Mild calcified plaque at the carotid bifurcations and siphons without hemodynamically significant stenosis or occlusion. 5. 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 No definite evidence of recurrent or residual aneurysm though evaluation is degraded, by streak artifact. Findings were discussed with Dr. Curly Shores at 3:02pm. Electronically Signed   By: Valetta Mole M.D.   On: 07/20/2022 15:17       LOS: 0 days   [facility] Pager on [email or web address]  07/21/2022, 9:25 AM

## 2022-07-21 NOTE — Evaluation (Signed)
Physical Therapy Evaluation Patient Details Name: Sara Frye MRN: [id number] DOB: 1928-11-04 Today's Date: 07/21/2022  History of Present Illness  88 y.o. female who presented 07/20/22 with difficulty talking, headache, and dizziness. CT head and CTA head and neck with no acute findings. MRI revealed small acute right cerebellar infarct  PMH significant of hypertension, hyperlipidemia, paroxysmal atrial fibrillation, CVA, cerebral aneurysm s/p coiling, and hypothyroidism  Clinical Impression   Pt admitted secondary to problem above with deficits below. PTA patient was living with significant other in one level home with 3 steps to enter. She was walking modified independent with rollator.  Pt currently requires minguard assist to ambulate with rollator. Anticipate she will be able to return home with supervision (which significant other can provide per pt). Anticipate patient will benefit from PT to address problems listed below.Will continue to follow acutely to maximize functional mobility independence and safety.          Recommendations for follow up therapy are one component of a multi-disciplinary discharge planning process, led by the attending physician.  Recommendations may be updated based on patient status, additional functional criteria and insurance authorization.  Follow Up Recommendations Home health PT      Assistance Recommended at Discharge Set up Supervision/Assistance  Patient can return home with the following  Assistance with cooking/housework;Assist for transportation;Help with stairs or ramp for entrance    Equipment Recommendations None recommended by PT  Recommendations for Other Services       Functional Status Assessment Patient has had a recent decline in their functional status and demonstrates the ability to make significant improvements in function in a reasonable and predictable amount of time.     Precautions / Restrictions  Precautions Precautions: Fall Precaution Comments: gets dizzy with positional changes (- for BP changes) Restrictions Weight Bearing Restrictions: No      Mobility  Bed Mobility               General bed mobility comments: up in recliner on arrival    Transfers Overall transfer level: Needs assistance Equipment used: Rolling walker (2 wheels) Transfers: Sit to/from Stand Sit to Stand: Min guard           General transfer comment: extra time and effort; uses momentum- no physical assist from therapist; verbal cues for safe use of rollator (lock brakes, hands push off furniture)    Ambulation/Gait Ambulation/Gait assistance: Min guard Gait Distance (Feet): 12 Feet (x2) Assistive device: Rollator (4 wheels) Gait Pattern/deviations: Step-through pattern   Gait velocity interpretation: >2.62 ft/sec, indicative of community ambulatory   General Gait Details: pt rushing to get to bathroom, but managed RW safely; on return to recliner, much better pace  Financial trader Rankin (Stroke Patients Only) Modified Rankin (Stroke Patients Only) Pre-Morbid Rankin Score: Slight disability Modified Rankin: Moderately severe disability     Balance Overall balance assessment: Needs assistance Sitting-balance support: No upper extremity supported, Feet supported Sitting balance-Leahy Scale: Good Sitting balance - Comments: at Edge of chair   Standing balance support: Single extremity supported, During functional activity Standing balance-Leahy Scale: Poor Standing balance comment: single UE support as pulling up her depends after toileting                             Pertinent Vitals/Pain Pain Assessment Pain Assessment: No/denies pain    Home Living  Family/patient expects to be discharged to:: Private residence Living Arrangements: Spouse/significant other ([place] is a few blocks away) Available Help at Discharge:  Family;Available 24 hours/day Type of Home: House Home Access: Stairs to enter Entrance Stairs-Rails: Can reach both Entrance Stairs-Number of Steps: 3   Home Layout: Multi-level;Able to live on main level with bedroom/bathroom Home Equipment: Rollator (4 wheels);Shower seat - built in;Hand held shower head;Grab bars - tub/shower      Prior Function Prior Level of Function : Independent/Modified Independent (does not drive anymore)             Mobility Comments: uses rollator ADLs Comments: doing own cooking/cleaning and ADL     Hand Dominance   Dominant Hand: Right    Extremity/Trunk Assessment   Upper Extremity Assessment Upper Extremity Assessment: Defer to OT evaluation    Lower Extremity Assessment Lower Extremity Assessment: Generalized weakness    Cervical / Trunk Assessment Cervical / Trunk Assessment: Kyphotic;Other exceptions Cervical / Trunk Exceptions: overweight  Communication   Communication: Expressive difficulties  Cognition Arousal/Alertness: Awake/alert Behavior During Therapy: WFL for tasks assessed/performed (talkative) Overall Cognitive Status: No family/caregiver present to determine baseline cognitive functioning                                 General Comments: tangential, can recall information; oriented to person, place, situation (time NT) unsure of baseline        General Comments General comments (skin integrity, edema, etc.): reports she always gets dizzy when she first stands up; waits a few seconds and it goes away    Exercises     Assessment/Plan    PT Assessment Patient needs continued PT services  PT Problem List Decreased balance;Decreased mobility;Decreased knowledge of use of DME;Decreased safety awareness;Obesity       PT Treatment Interventions DME instruction;Gait training;Stair training;Functional mobility training;Therapeutic activities;Balance training;Cognitive remediation;Patient/family education     PT Goals (Current goals can be found in the Care Plan section)  Acute Rehab PT Goals Patient Stated Goal: return home ASAP PT Goal Formulation: With patient Time For Goal Achievement: 08/04/22 Potential to Achieve Goals: Good    Frequency Min 3X/week     Co-evaluation               AM-PAC PT "6 Clicks" Mobility  Outcome Measure Help needed turning from your back to your side while in a flat bed without using bedrails?: None Help needed moving from lying on your back to sitting on the side of a flat bed without using bedrails?: A Little Help needed moving to and from a bed to a chair (including a wheelchair)?: A Little Help needed standing up from a chair using your arms (e.g., wheelchair or bedside chair)?: A Little Help needed to walk in hospital room?: A Little Help needed climbing 3-5 steps with a railing? : A Little 6 Click Score: 19    End of Session Equipment Utilized During Treatment: Gait belt Activity Tolerance: Patient tolerated treatment well Patient left: in chair;with call bell/phone within reach;with chair alarm set Nurse Communication: Mobility status;Other (comment) (had BM) PT Visit Diagnosis: Unsteadiness on feet (R26.81)    Time: 3149-7026 PT Time Calculation (min) (ACUTE ONLY): 31 min   Charges:   PT Evaluation $PT Eval Low Complexity: 1 Low PT Treatments $Therapeutic Activity: 8-22 mins         Arby Barrette, PT Acute Rehabilitation Services  Office [phone]   Jeani Hawking  P [place] Catarino 07/21/2022, 3:33 PM

## 2022-07-21 NOTE — Plan of Care (Signed)
  Problem: Education: Goal: Knowledge of disease or condition will improve Outcome: Progressing Goal: Knowledge of secondary prevention will improve (MUST DOCUMENT ALL) Outcome: Progressing Goal: Knowledge of patient specific risk factors will improve Sara Frye N/A or DELETE if not current risk factor) Outcome: Progressing   Problem: Ischemic Stroke/TIA Tissue Perfusion: Goal: Complications of ischemic stroke/TIA will be minimized Outcome: Progressing   Problem: Coping: Goal: Will verbalize positive feelings about self Outcome: Progressing Goal: Will identify appropriate support needs Outcome: Progressing   Problem: Health Behavior/Discharge Planning: Goal: Ability to manage health-related needs will improve Outcome: Progressing Goal: Goals will be collaboratively established with patient/family Outcome: Progressing   Problem: Self-Care: Goal: Ability to participate in self-care as condition permits will improve Outcome: Progressing Goal: Verbalization of feelings and concerns over difficulty with self-care will improve Outcome: Progressing Goal: Ability to communicate needs accurately will improve Outcome: Progressing   Problem: Nutrition: Goal: Risk of aspiration will decrease Outcome: Progressing Goal: Dietary intake will improve Outcome: Progressing   Problem: Education: Goal: Knowledge of General Education information will improve Description: Including pain rating scale, medication(s)/side effects and non-pharmacologic comfort measures Outcome: Progressing   Problem: Health Behavior/Discharge Planning: Goal: Ability to manage health-related needs will improve Outcome: Progressing   Problem: Clinical Measurements: Goal: Ability to maintain clinical measurements within normal limits will improve Outcome: Progressing Goal: Will remain free from infection Outcome: Progressing Goal: Diagnostic test results will improve Outcome: Progressing Goal: Respiratory  complications will improve Outcome: Progressing Goal: Cardiovascular complication will be avoided Outcome: Progressing   Problem: Activity: Goal: Risk for activity intolerance will decrease Outcome: Progressing   Problem: Nutrition: Goal: Adequate nutrition will be maintained Outcome: Progressing   Problem: Coping: Goal: Level of anxiety will decrease Outcome: Progressing   Problem: Elimination: Goal: Will not experience complications related to bowel motility Outcome: Progressing Goal: Will not experience complications related to urinary retention Outcome: Progressing   Problem: Pain Managment: Goal: General experience of comfort will improve Outcome: Progressing   Problem: Safety: Goal: Ability to remain free from injury will improve Outcome: Progressing   Problem: Skin Integrity: Goal: Risk for impaired skin integrity will decrease Outcome: Progressing   Problem: Education: Goal: Knowledge of secondary prevention will improve (MUST DOCUMENT ALL) Outcome: Progressing

## 2022-07-21 NOTE — Evaluation (Signed)
Speech Language Pathology Evaluation Patient Details Name: REGINA GANCI MRN: [id number] DOB: Oct 04, 1928 Today's Date: 07/21/2022 Time: 3094-0768 SLP Time Calculation (min) (ACUTE ONLY): 22 min  Problem List:  Patient Active Problem List   Diagnosis Date Noted   Aphasia 07/20/2022   Hypothyroidism 07/20/2022   Bradycardia 07/14/2022   BPPV (benign paroxysmal positional vertigo) 04/16/2017   History of stroke 03/09/2017   H/O cerebral aneurysm repair ACA S/P Coiling by Dr. Estanislado Pandy 01/07/17 01/07/2017   Chronic anticoagulation 04/16/2016   Speech disturbance 12/17/2015   Atrial fibrillation ([place]) 12/17/2015   Essential hypertension 12/17/2015   Hypercholesteremia 12/17/2015   Dizziness 12/17/2015   CVA (cerebral vascular accident) [facility])    Past Medical History:  Past Medical History:  Diagnosis Date   Arthritis    spine   Atrial fibrillation ([place])    Brain aneurysm 01/07/2017   Dysrhythmia    afib   H/O cerebral aneurysm repair ACA S/P Coiling by Dr. Estanislado Pandy 01/07/17 01/07/2017   Headache    with Eliquis   Hypertension    Hypothyroidism    Maternal blood transfusion    Neuromuscular disorder ([place])    spinal injury- "I'm unsure of walking, I'm very careful"   Shingles    Stroke ([place]) 11/2015   Past Surgical History:  Past Surgical History:  Procedure Laterality Date   ABDOMINAL HYSTERECTOMY     ovaries removed   APPENDECTOMY     CARPAL TUNNEL RELEASE Bilateral    CATARACT EXTRACTION     CHOLECYSTECTOMY     IR ANGIO INTRA EXTRACRAN SEL COM CAROTID INNOMINATE BILAT MOD SED  12/30/2016   IR ANGIO INTRA EXTRACRAN SEL COM CAROTID INNOMINATE BILAT MOD SED  03/08/2018   IR ANGIO INTRA EXTRACRAN SEL INTERNAL CAROTID UNI R MOD SED  01/07/2017   IR ANGIO VERTEBRAL SEL SUBCLAVIAN INNOMINATE UNI R MOD SED  12/30/2016   IR ANGIO VERTEBRAL SEL VERTEBRAL BILAT MOD SED  03/08/2018   IR ANGIO VERTEBRAL SEL VERTEBRAL UNI L MOD SED  12/30/2016   IR ANGIOGRAM FOLLOW UP STUDY   01/07/2017   IR ANGIOGRAM FOLLOW UP STUDY  01/07/2017   IR ANGIOGRAM FOLLOW UP STUDY  01/07/2017   IR NEURO EACH ADD'L AFTER BASIC UNI RIGHT (MS)  01/07/2017   IR RADIOLOGIST EVAL & MGMT  11/24/2016   IR RADIOLOGIST EVAL & MGMT  01/25/2017   IR TRANSCATH/EMBOLIZ  01/07/2017   JOINT REPLACEMENT Bilateral    arthroscopic - knees   RADIOLOGY WITH ANESTHESIA N/A 12/30/2016   Procedure: RADIOLOGY WITH ANESTHESIA  EMBOLIZATION;  Surgeon: Luanne Bras, MD;  Location: [facility];  Service: Radiology;  Laterality: N/A;   RADIOLOGY WITH ANESTHESIA N/A 01/07/2017   Procedure: EMBOLIZATION;  Surgeon: Luanne Bras, MD;  Location: [facility];  Service: Radiology;  Laterality: N/A;   HPI:  Abbegale P Laboy is a 88 y.o. female with medical history significant of hypertension, hyperlipidemia, paroxysmal atrial fibrillation, CVA, cerebral aneurysm s/p coiling, and hypothyroidism who presented with difficulty talking at 11 AM.  Reported associated symptoms of mild headache and dizziness. Her daughter had left the patient in her normal state of health at around 10 AM to take her stepfather to a doctor's appointment.  The patient had being sitting up in the kitchen and washing  clothes.  When she got back she found the patient on the couch stating that she could not talk.  Patient reports knowing what she wants to say, but having difficulty getting words out.  MRI shows small right  cerebellar infarct.   Assessment / Plan / Recommendation Clinical Impression  Pt demonstrates mild speech impairment characterized by occasional part word deletions substitutions and mis cues. Language structure and word finding intact. Pragmatics also impacted with decreased awareness of topic maintenance impairment and poor alternating attention. Pt able to sustain attention to semantic and phonemic fluency task with good accuracy, but unable to switch categories in initial portions of the Cerebellar Cognitive Affective Schmahmann  Syndrome Scale. SLP initiated counseling to increase pts awareness of deficits and slow rate and overarticulate for improved accuracy. Will f/u while admitted and advise f/u with  HHSLP.    SLP Assessment  SLP Recommendation/Assessment: Patient needs continued Speech Lanaguage Pathology Services    Recommendations for follow up therapy are one component of a multi-disciplinary discharge planning process, led by the attending physician.  Recommendations may be updated based on patient status, additional functional criteria and insurance authorization.    Follow Up Recommendations  Home health SLP    Assistance Recommended at Discharge  None  Functional Status Assessment Patient has had a recent decline in their functional status and demonstrates the ability to make significant improvements in function in a reasonable and predictable amount of time.  Frequency and Duration min 2x/week  2 weeks      SLP Evaluation Cognition  Overall Cognitive Status: No family/caregiver present to determine baseline cognitive functioning Arousal/Alertness: Awake/alert Orientation Level: Oriented X4 Attention: Focused;Sustained;Selective;Alternating Focused Attention: Appears intact Sustained Attention: Appears intact Selective Attention: Appears intact Alternating Attention: Impaired Alternating Attention Impairment: Verbal basic;Verbal complex Memory: Appears intact Awareness: Impaired Awareness Impairment: Emergent impairment Problem Solving: Appears intact Behaviors:  (tangential) Safety/Judgment: Appears intact       Comprehension  Auditory Comprehension Overall Auditory Comprehension: Appears within functional limits for tasks assessed    Expression Verbal Expression Overall Verbal Expression: Impaired Initiation: No impairment Automatic Speech: Name;Social Response Level of Generative/Spontaneous Verbalization: Conversation Repetition: No impairment Naming: No impairment Pragmatics:  Impairment Impairments: Turn Taking;Topic maintenance;Topic appropriateness Interfering Components: Speech intelligibility Written Expression Dominant Hand: Right   Oral / Motor  Oral Motor/Sensory Function Overall Oral Motor/Sensory Function: Within functional limits Motor Speech Overall Motor Speech: Impaired Respiration: Within functional limits Phonation: Normal Resonance: Within functional limits Articulation: Impaired Level of Impairment: Conversation Intelligibility: Intelligible Motor Planning: Witnin functional limits Motor Speech Errors: Aware;Inconsistent            Gailyn Crook, Katherene Ponto 07/21/2022, 11:04 AM

## 2022-07-22 DIAGNOSIS — I63342 Cerebral infarction due to thrombosis of left cerebellar artery: Secondary | ICD-10-CM | POA: Diagnosis not present

## 2022-07-22 LAB — BASIC METABOLIC PANEL
Anion gap: 9 (ref 5–15)
BUN: 6 mg/dL — ABNORMAL LOW (ref 8–23)
CO2: 23 mmol/L (ref 22–32)
Calcium: 8.3 mg/dL — ABNORMAL LOW (ref 8.9–10.3)
Chloride: 102 mmol/L (ref 98–111)
Creatinine, Ser: 0.75 mg/dL (ref 0.44–1.00)
GFR, Estimated: >60 mL/min (ref >60)
Glucose, Bld: 98 mg/dL (ref 70–99)
Potassium: 3.8 mmol/L (ref 3.5–5.1)
Sodium: 134 mmol/L — ABNORMAL LOW (ref 135–145)

## 2022-07-22 LAB — CBC
HCT: 38.6 % (ref 36.0–46.0)
Hemoglobin: 12.6 g/dL (ref 12.0–15.0)
MCH: 28.3 pg (ref 26.0–34.0)
MCHC: 32.6 g/dL (ref 30.0–36.0)
MCV: 86.5 fL (ref 80.0–100.0)
Platelets: 256 10*3/uL (ref 150–400)
RBC: 4.46 MIL/uL (ref 3.87–5.11)
RDW: 14.5 % (ref 11.5–15.5)
WBC: 5.2 10*3/uL (ref 4.0–10.5)
nRBC: 0 % (ref 0.0–0.2)

## 2022-07-22 LAB — MAGNESIUM: Magnesium: 2.2 mg/dL (ref 1.7–2.4)

## 2022-07-22 MED ORDER — ASPIRIN 81 MG PO TBEC: 81.0000 mg | DELAYED_RELEASE_TABLET | Freq: Every day | ORAL | 12 refills | Status: DC

## 2022-07-22 MED ORDER — LISINOPRIL 10 MG PO TABS
10.0000 mg | ORAL_TABLET | Freq: Every day | ORAL | Status: DC
  Administered 2022-07-22 – 2022-07-23 (×2): 10 mg via ORAL
  Filled 2022-07-22 (×2): qty 1

## 2022-07-22 MED ORDER — ONDANSETRON HCL 4 MG/2ML IJ SOLN
4.0000 mg | INTRAMUSCULAR | Status: DC | PRN
  Administered 2022-07-22: 4 mg via INTRAVENOUS
  Filled 2022-07-22: qty 2

## 2022-07-22 MED ORDER — MECLIZINE HCL 25 MG PO TABS: 25.0000 mg | ORAL_TABLET | Freq: Three times a day (TID) | ORAL | 0 refills | Status: AC | PRN

## 2022-07-22 MED ORDER — MECLIZINE HCL 25 MG PO TABS
25.0000 mg | ORAL_TABLET | Freq: Three times a day (TID) | ORAL | Status: DC | PRN
  Administered 2022-07-22 – 2022-07-23 (×2): 25 mg via ORAL
  Filled 2022-07-22 (×4): qty 1

## 2022-07-22 NOTE — Progress Notes (Signed)
   07/22/22 1515  Clinical Encounter Type  Visited With Patient  Visit Type Follow-up;Social support  Referral From Chaplain  Consult/Referral To [place] followed up on a spiritual consult for this patient, Sara Frye. She is in good spirits and looking forward to returning home with family. [name] shared her love of people and how she enjoys caring for them in so many ways. She is truly a giver and one who is energized by those around her. [name] shared that she was able to walk with her therapist twice today and that was the last of her goals for discharge. I wished her a peaceful evening as I departed.   Danice Goltz [facility]  [phone]

## 2022-07-22 NOTE — TOC Transition Note (Addendum)
Transition of Care [facility]) - CM/SW Discharge Note   Patient Details  Name: KIMBERLLY NORGARD MRN: [id number] Date of Birth: 1929-05-24  Transition of Care [facility]) CM/SW Contact:  Pollie Friar, RN Phone Number: 07/22/2022, 10:35 AM   Clinical Narrative:    Pt is discharging home with home health services through [place]. Information on the AVS.  Pt has needed DME at home.  Pts spouse is with her most of the time and can over see her meds.  Pts daughter checks on them regularly and provides needed transportation.  1620: daughter appealing discharge.   Final next level of care: Home w Home Health Services Barriers to Discharge: No Barriers Identified   Patient Goals and CMS Choice   CMS Medicare.gov Compare Post Acute Care list provided to:: Patient Choice offered to / list presented to : Patient  Discharge Placement                       Discharge Plan and Services                          HH Arranged: PT, OT, Nurse's Aide [place] Agency: [place] Date [facility] Agency Contacted: 07/22/22   Representative spoke with at [place]: [place] ([place]) Interventions     Readmission Risk Interventions     No data to display

## 2022-07-22 NOTE — TOC Progression Note (Signed)
Transition of Care [facility]) - Progression Note    Patient Details  Name: Sara Frye MRN: [id number] Date of Birth: [birth year]  Transition of Care [facility]) CM/SW Contact  Orbie Pyo Phone Number: 07/22/2022, 3:41 PM  Judithann Graves Appeal Detailed Notice of Discharge letter created and saved:  Joya San) Detailed Notice of Discharge Document Given to [place]: Yes Joya San) [employer] Document Created: Yes Joya San) [employer] appeal documents uploaded to [employer] stite: Yes Orbie Pyo)

## 2022-07-22 NOTE — Progress Notes (Signed)
Mobility Specialist: Progress Note   07/22/22 1515  Mobility  Activity Ambulated with assistance in hallway  Level of Assistance Minimal assist, patient does 75% or more  Assistive Device Four wheel walker  Distance Ambulated (ft) 360 ft  Activity Response Tolerated well  Mobility Referral Yes  $Mobility charge 1 Mobility   Pre-Mobility: 80 HR, 156/89 (108) BP, 99% SpO2 During-Mobility: 120s-130s HR Post-Mobility: 86 HR, 139/63 (83) BP, 97% SpO2  Pt received in the chair and agreeable to mobility. C/o dizziness while sitting in the chair but resolved with distance. Stopped x1 for standing break secondary to mild SOB and elevated HR. Pt back to the chair after session with call bell and phone in reach.   [place] Suri Tafolla Mobility Specialist Please contact via SecureChat or Rehab office at [phone]

## 2022-07-22 NOTE — Discharge Summary (Signed)
Physician Discharge Summary  Sara Frye:[id number] DOB: 05/22/1929 DOA: 07/20/2022  PCP: Deland Pretty, MD  Admit date: 07/20/2022 Discharge date: 07/22/2022  Admitted From: Home Disposition: Home  Recommendations for Outpatient Follow-up:  Follow up with PCP in 1-2 weeks  Home Health: PT Equipment/Devices: No new equipment  Discharge Condition: Stable CODE STATUS: Full Diet recommendation: Regular diet as tolerated  Brief/Interim Summary: 88 y.o. female with medical history significant of hypertension, hyperlipidemia, paroxysmal atrial fibrillation, CVA, cerebral aneurysm s/p coiling, and hypothyroidism who presented with difficulty talking at 11 AM on 07/21/22.  Reported associated symptoms of mild headache and dizziness. Her daughter had left the patient in her normal state of health at around 10 AM to take her stepfather to a doctor's appointment.  She chronically has abdominal pain and back pain.  Her daughter makes note that she had been switched from metoprolol to lisinopril on 11/21 by cardiology after being found to be bradycardic with heart rates in 40s.  She was seen as a code stroke.  Was hospitalized for further management.   Patient admitted as above as code stroke with intermittent aphasia, MRI did show right-sided cerebellar infarct with subtle left-sided weakness.  Patient evaluated by PT recommending home health PT given her improving symptoms and otherwise stable ambulation.  Patient does admit to orthostatic symptoms of dizziness but this appears to be chronic, orthostatic blood pressures unremarkable.  Recommended to follow-up with PCP for ongoing evaluation given she is on chronic meclizine she understands this is a chronic issue and likely unrelated to her current diagnosis.  Neurology evaluating recommending additional 81 mg aspirin for stroke prevention as well as increased Crestor dosing.  Patient otherwise stable and agreeable for discharge  home.  *Family appealing discharge as they feel the patient is not ready for discharge yet despite ambulating quite well on her own with minimal assistance with physical therapy, neurology has signed off and otherwise medication changes have been outlined as above.  Will await appeal otherwise patient medically stable for discharge home.  Discharge Diagnoses:  Principal Problem:   CVA (cerebral vascular accident) [facility]) Active Problems:   Aphasia   Atrial fibrillation ([place])   Chronic anticoagulation   Essential hypertension   Hypercholesteremia   History of stroke   H/O cerebral aneurysm repair ACA S/P Coiling by Dr. Estanislado Pandy 01/07/17   Hypothyroidism   Acute CVA (cerebrovascular accident) [facility])    Discharge Instructions  Discharge Instructions     Ambulatory referral to Neurology   Complete by: As directed    Follow up with stroke clinic NP (Jessica Vanschaick or Cecille Rubin, if both not available, consider Zachery Dauer, or Ahern) at [facility] in about 4 weeks. Thanks.      Allergies as of 07/22/2022       Reactions   Shrimp (diagnostic) Itching        Medication List     STOP taking these medications    clobetasol ointment 0.05 % Commonly known as: TEMOVATE   hydrochlorothiazide 12.5 MG tablet Commonly known as: HYDRODIURIL   traMADol 50 MG tablet Commonly known as: ULTRAM       TAKE these medications    acetaminophen 500 MG tablet Commonly known as: TYLENOL Take 500 mg by mouth daily as needed for mild pain.   aspirin EC 81 MG tablet Take 1 tablet (81 mg total) by mouth daily. Swallow whole. Start taking on: July 23, 2022   Biotin 5000 MCG Tabs Take 1 tablet by mouth daily.  cholecalciferol 1000 units tablet Commonly known as: VITAMIN D Take 1,000 Units by mouth daily.   Eliquis 5 MG Tabs tablet Generic drug: apixaban TAKE 1 TABLET BY MOUTH TWICE DAILY What changed: how much to take   furosemide 20 MG tablet Commonly known as:  LASIX TAKE 1 TABLET BY MOUTH DAILY AS NEEDED FOR LEG SWELLING What changed: See the new instructions.   gabapentin 100 MG capsule Commonly known as: NEURONTIN Take 100-200 mg by mouth See admin instructions. 100 mg in the morning, 200 mg at bedtime.   Gemtesa 75 MG Tabs Generic drug: Vibegron Take 75 mg by mouth daily.   levothyroxine 25 MCG tablet Commonly known as: SYNTHROID Take 25 mcg by mouth daily.   lisinopril 10 MG tablet Commonly known as: ZESTRIL Take 1 tablet (10 mg total) by mouth daily.   meclizine 25 MG tablet Commonly known as: ANTIVERT Take 1 tablet (25 mg total) by mouth 3 (three) times daily as needed for dizziness.   OVER THE COUNTER MEDICATION Take 1 each by mouth daily. Beet root gummy   rosuvastatin 5 MG tablet Commonly known as: CRESTOR Take 0.5 tablets (2.5 mg total) by mouth at bedtime.   sertraline 50 MG tablet Commonly known as: ZOLOFT Take 50 mg by mouth daily.        Follow-up Information     [facility]. Schedule an appointment as soon as possible for a visit in 1 month(s).   Specialty: Neurology Why: stroke clinic Contact information: [place], [facility] Follow up.   Specialty: Home Health Services Why: The home health agency will contact you for the first home visit. Contact information: [address] [place] [address] [phone]                Allergies  Allergen Reactions   Shrimp (Diagnostic) Itching    Consultations: Neurology  Procedures/Studies: ECHOCARDIOGRAM COMPLETE  Result Date: 07/21/2022    ECHOCARDIOGRAM REPORT   Patient Name:   Sara Frye Date of Exam: 07/21/2022 Medical Rec #:  [id number]          Height:       60.0 in Accession #:    [id number]         Weight:       177.2 lb Date of Birth:  [birth year]          BSA:          1.773 m Patient Age:    54 years            BP:           146/102 mmHg Patient Gender: F                  HR:           73 bpm. Exam Location:  Inpatient Procedure: 2D Echo Indications:    Stroke I63.9  History:        Patient has prior history of Echocardiogram examinations, most                 recent 11/06/2020. CVA and Stroke, Arrythmias:Atrial Fibrillation                 and Bradycardia; Risk Factors:Hypertension, Dyslipidemia and                 Non-Smoker.  Sonographer:    Constance Holster  Walker Referring Phys: Rosalin Hawking  Sonographer Comments: Image acquisition challenging due to patient body habitus and Image acquisition challenging due to respiratory motion. IMPRESSIONS  1. Left ventricular ejection fraction, by estimation, is 65 to 70%. The left ventricle has normal function. The left ventricle has no regional wall motion abnormalities. There is mild concentric left ventricular hypertrophy. Left ventricular diastolic parameters are consistent with Grade II diastolic dysfunction (pseudonormalization). Elevated left atrial pressure.  2. Right ventricular systolic function is normal. The right ventricular size is normal. Tricuspid regurgitation signal is inadequate for assessing PA pressure.  3. Left atrial size was mildly dilated.  4. The mitral valve is degenerative. Mild mitral valve regurgitation. Mild mitral stenosis. The mean mitral valve gradient is 4.5 mmHg with average heart rate of 72 bpm. Severe mitral annular calcification.  5. The aortic valve is grossly normal. Aortic valve regurgitation is not visualized. No aortic stenosis is present.  6. The inferior vena cava is normal in size with greater than 50% respiratory variability, suggesting right atrial pressure of 3 mmHg. Comparison(s): Prior images unable to be directly viewed, comparison made by report only. FINDINGS  Left Ventricle: Left ventricular ejection fraction, by estimation, is 65 to 70%. The left ventricle has normal function. The left ventricle has no regional wall motion abnormalities.  The left ventricular internal cavity size was normal in size. There is  mild concentric left ventricular hypertrophy. Left ventricular diastolic parameters are consistent with Grade II diastolic dysfunction (pseudonormalization). Elevated left atrial pressure. Right Ventricle: The right ventricular size is normal. No increase in right ventricular wall thickness. Right ventricular systolic function is normal. Tricuspid regurgitation signal is inadequate for assessing PA pressure. Left Atrium: Left atrial size was mildly dilated. Right Atrium: Right atrial size was normal in size. Pericardium: There is no evidence of pericardial effusion. Mitral Valve: The mitral valve is degenerative in appearance. Severe mitral annular calcification. Mild mitral valve regurgitation. Mild mitral valve stenosis. The mean mitral valve gradient is 4.5 mmHg with average heart rate of 72 bpm. Tricuspid Valve: The tricuspid valve is grossly normal. Tricuspid valve regurgitation is not demonstrated. Aortic Valve: The aortic valve is grossly normal. Aortic valve regurgitation is not visualized. No aortic stenosis is present. Pulmonic Valve: The pulmonic valve was not well visualized. Aorta: The aortic root and ascending aorta are structurally normal, with no evidence of dilitation. Venous: The inferior vena cava is normal in size with greater than 50% respiratory variability, suggesting right atrial pressure of 3 mmHg. IAS/Shunts: No atrial level shunt detected by color flow Doppler.  LEFT VENTRICLE PLAX 2D LVIDd:         3.50 cm   Diastology LVIDs:         2.70 cm   LV e' medial:    7.07 cm/s LV PW:         1.00 cm   LV E/e' medial:  20.1 LV IVS:        1.30 cm   LV e' lateral:   6.85 cm/s LVOT diam:     1.60 cm   LV E/e' lateral: 20.7 LVOT Area:     2.01 cm  RIGHT VENTRICLE RV S prime:     10.70 cm/s TAPSE (M-mode): 2.1 cm LEFT ATRIUM              Index        RIGHT ATRIUM           Index LA diam:  4.30 cm  2.42 cm/m   RA Area:      16.40 cm LA Vol (A2C):   119.0 ml 67.11 ml/m  RA Volume:   36.10 ml  20.36 ml/m LA Vol (A4C):   94.0 ml  53.01 ml/m LA Biplane Vol: 109.0 ml 61.47 ml/m   AORTA Ao Root diam: 3.25 cm Ao Asc diam:  3.60 cm MITRAL VALVE MV Area (PHT): 3.21 cm     SHUNTS MV Mean grad:  4.5 mmHg     Systemic Diam: 1.60 cm MV Decel Time: 236 msec MR Peak grad: 69.4 mmHg MR Vmax:      416.50 cm/s MV E velocity: 142.00 cm/s MV A velocity: 135.00 cm/s MV E/A ratio:  1.05 Mihai Croitoru MD Electronically signed by Sanda Klein MD Signature Date/Time: 07/21/2022/5:56:48 PM    Final    MR BRAIN WO CONTRAST  Result Date: 07/20/2022 CLINICAL DATA:  Neuro deficit, acute, stroke suspected. Aphasia and headache. EXAM: MRI HEAD WITHOUT CONTRAST TECHNIQUE: Multiplanar, multiecho pulse sequences of the brain and surrounding structures were obtained without intravenous contrast. COMPARISON:  Head CT and CTA 07/20/2022.  Head MRI 05/03/2020. FINDINGS: Brain: There is a 1 cm acute infarct in the right cerebellar hemisphere. No acute supratentorial infarct, intracranial hemorrhage, mass, midline shift, or extra-axial fluid collection is identified. T2 hyperintensities in the cerebral white matter bilaterally are unchanged from the prior MRI and are nonspecific but compatible with moderate chronic small vessel ischemic disease. Small chronic bilateral parietal and left cerebellar infarcts are also unchanged. Cerebral atrophy is mild for age. Vascular: More fully evaluated on today's CTA. Skull and upper cervical spine: No suspicious marrow lesion. Asymmetrically advanced left-sided facet arthrosis in the upper cervical spine. Sinuses/Orbits: Left cataract extraction. Mild mucosal thickening in the ethmoid and sphenoid sinuses. No significant mastoid fluid. Other: None. IMPRESSION: 1. Small acute right cerebellar infarct. 2. Moderate chronic small vessel ischemic disease. Electronically Signed   By: Logan Bores M.D.   On: 07/20/2022 18:26    CT HEAD CODE STROKE WO CONTRAST  Result Date: 07/20/2022 CLINICAL DATA:  Code stroke.  TIA.  Headache and slurred speech. EXAM: CT ANGIOGRAPHY HEAD AND NECK TECHNIQUE: Multidetector CT imaging of the head and neck was performed using the standard protocol during bolus administration of intravenous contrast. Multiplanar CT image reconstructions and MIPs were obtained to evaluate the vascular anatomy. Carotid stenosis measurements (when applicable) are obtained utilizing NASCET criteria, using the distal internal carotid diameter as the denominator. RADIATION DOSE REDUCTION: This exam was performed according to the departmental dose-optimization program which includes automated exposure control, adjustment of the mA and/or kV according to patient size and/or use of iterative reconstruction technique. CONTRAST:  97m OMNIPAQUE IOHEXOL 350 MG/ML SOLN COMPARISON:  MR and MRA head 05/03/2020 FINDINGS: CT HEAD FINDINGS Brain: There is no acute intracranial hemorrhage, extra-axial fluid collection, or acute infarct Parenchymal volume is within expected limits for age. The ventricles are normal in size. There is a small area of cortical encephalomalacia in the high left parietal lobe, unchanged. Additional patchy and confluence hypodensity in the supratentorial white matter likely reflects sequela of chronic small-vessel ischemic change. There is no mass lesion. There is no mass effect or midline shift. Vascular: Postsurgical changes reflecting prior A-comm aneurysm treatment are noted. No hyperdense vessel is seen. Skull: Normal. Negative for fracture or focal lesion. Sinuses/Orbits: The paranasal sinuses are clear. A left lens implant is noted. The globes and orbits are otherwise unremarkable. Other: None. ASPECTS ([facility]  Early [facility] Score) - Ganglionic level infarction (caudate, lentiform nuclei, internal capsule, insula, M1-M3 cortex): 7 - Supraganglionic infarction (M4-M6 cortex): 3 Total score (0-10  with 10 being normal): 10 CTA NECK FINDINGS Aortic arch: There is mild calcified plaque in the imaged aortic arch. The origins of the major branch vessels are patent. The subclavian arteries are patent to the level imaged. Right carotid system: The right common, internal, and external carotid arteries are patent, with mild plaque at the bifurcation but no hemodynamically significant stenosis or occlusion. There is no dissection or aneurysm. Left carotid system: The left common, internal, and external carotid arteries are patent, with mild plaque at the bifurcation but no hemodynamically significant stenosis or occlusion. There is no dissection or aneurysm. Vertebral arteries: The vertebral arteries are patent, without hemodynamically significant stenosis or occlusion. There is no dissection or aneurysm. Skeleton: There is overall mild for age degenerative change of the cervical spine. There is no acute osseous abnormality or suspicious osseous lesion. There is no visible canal hematoma. Other neck: The soft tissues of the neck are unremarkable. Upper chest: The imaged lung apices are clear. Review of the MIP images confirms the above findings CTA HEAD FINDINGS Anterior circulation: There is mild calcified plaque in the carotid siphons without hemodynamically significant stenosis. The bilateral MCAs are patent, without proximal stenosis or occlusion. 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 Streak artifact degrades evaluation of the surrounding vasculature. The left A1 segment is patent. The ACA is beyond the stent and coil pack appear patent, without proximal high-grade stenosis or occlusion. There is no definite evidence of residual or recurrent aneurysm. There is no new aneurysm. Posterior circulation: The right V4 segment is occluded after the PICA origin, similar to the prior MRA. The left V4 segment is widely patent. PICA is  identified bilaterally. The basilar artery is patent. The bilateral PCAs are patent, without proximal stenosis or occlusion. Bilateral posterior communicating arteries are identified. There is no aneurysm or AVM. Venous sinuses: As permitted by contrast timing, patent. Anatomic variants: None. Review of the MIP images confirms the above findings IMPRESSION: 1. No acute intracranial pathology. 2. No emergent large vessel occlusion. 3. 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 4. Mild calcified plaque at the carotid bifurcations and siphons without hemodynamically significant stenosis or occlusion. 5. 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 No definite evidence of recurrent or residual aneurysm though evaluation is degraded, by streak artifact. Findings were discussed with Dr. Curly Shores at 3:02pm. Electronically Signed   By: Valetta Mole M.D.   On: 07/20/2022 15:17   CT ANGIO HEAD NECK W WO CM (CODE STROKE)  Result Date: 07/20/2022 CLINICAL DATA:  Code stroke.  TIA.  Headache and slurred speech. EXAM: CT ANGIOGRAPHY HEAD AND NECK TECHNIQUE: Multidetector CT imaging of the head and neck was performed using the standard protocol during bolus administration of intravenous contrast. Multiplanar CT image reconstructions and MIPs were obtained to evaluate the vascular anatomy. Carotid stenosis measurements (when applicable) are obtained utilizing NASCET criteria, using the distal internal carotid diameter as the denominator. RADIATION DOSE REDUCTION: This exam was performed according to the departmental dose-optimization program which includes automated exposure control, adjustment of the mA and/or kV according to patient size and/or use of iterative reconstruction technique. CONTRAST:  13m OMNIPAQUE IOHEXOL 350 MG/ML SOLN COMPARISON:  MR and MRA head 05/03/2020 FINDINGS: CT HEAD FINDINGS  Brain: There is no acute  intracranial hemorrhage, extra-axial fluid collection, or acute infarct Parenchymal volume is within expected limits for age. The ventricles are normal in size. There is a small area of cortical encephalomalacia in the high left parietal lobe, unchanged. Additional patchy and confluence hypodensity in the supratentorial white matter likely reflects sequela of chronic small-vessel ischemic change. There is no mass lesion. There is no mass effect or midline shift. Vascular: Postsurgical changes reflecting prior A-comm aneurysm treatment are noted. No hyperdense vessel is seen. Skull: Normal. Negative for fracture or focal lesion. Sinuses/Orbits: The paranasal sinuses are clear. A left lens implant is noted. The globes and orbits are otherwise unremarkable. Other: None. ASPECTS [facility] Stroke Program Early CT Score) - Ganglionic level infarction (caudate, lentiform nuclei, internal capsule, insula, M1-M3 cortex): 7 - Supraganglionic infarction (M4-M6 cortex): 3 Total score (0-10 with 10 being normal): 10 CTA NECK FINDINGS Aortic arch: There is mild calcified plaque in the imaged aortic arch. The origins of the major branch vessels are patent. The subclavian arteries are patent to the level imaged. Right carotid system: The right common, internal, and external carotid arteries are patent, with mild plaque at the bifurcation but no hemodynamically significant stenosis or occlusion. There is no dissection or aneurysm. Left carotid system: The left common, internal, and external carotid arteries are patent, with mild plaque at the bifurcation but no hemodynamically significant stenosis or occlusion. There is no dissection or aneurysm. Vertebral arteries: The vertebral arteries are patent, without hemodynamically significant stenosis or occlusion. There is no dissection or aneurysm. Skeleton: There is overall mild for age degenerative change of the cervical spine. There is no acute osseous abnormality or suspicious osseous  lesion. There is no visible canal hematoma. Other neck: The soft tissues of the neck are unremarkable. Upper chest: The imaged lung apices are clear. Review of the MIP images confirms the above findings CTA HEAD FINDINGS Anterior circulation: There is mild calcified plaque in the carotid siphons without hemodynamically significant stenosis. The bilateral MCAs are patent, without proximal stenosis or occlusion. 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 Streak artifact degrades evaluation of the surrounding vasculature. The left A1 segment is patent. The ACA is beyond the stent and coil pack appear patent, without proximal high-grade stenosis or occlusion. There is no definite evidence of residual or recurrent aneurysm. There is no new aneurysm. Posterior circulation: The right V4 segment is occluded after the PICA origin, similar to the prior MRA. The left V4 segment is widely patent. PICA is identified bilaterally. The basilar artery is patent. The bilateral PCAs are patent, without proximal stenosis or occlusion. Bilateral posterior communicating arteries are identified. There is no aneurysm or AVM. Venous sinuses: As permitted by contrast timing, patent. Anatomic variants: None. Review of the MIP images confirms the above findings IMPRESSION: 1. No acute intracranial pathology. 2. No emergent large vessel occlusion. 3. 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 4. Mild calcified plaque at the carotid bifurcations and siphons without hemodynamically significant stenosis or occlusion. 5. 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 No definite evidence of recurrent or residual aneurysm though evaluation is degraded, by streak artifact. Findings were discussed with Dr. Curly Shores at 3:02pm. Electronically Signed   By: Valetta Mole M.D.    On: 07/20/2022 15:17     Subjective: No acute issues or events overnight, patient complaining of orthostatic dizziness which  appears to be chronic.  Declines any further weakness speech impediment or other deficits.   Discharge Exam: Vitals:   07/22/22 0840 07/22/22 1122  BP: (!) 162/64 (!) 154/72  Pulse: 64 60  Resp: 18 18  Temp: 97.6 F (36.4 C) 97.6 F (36.4 C)  SpO2:  99%   Vitals:   07/22/22 0029 07/22/22 0303 07/22/22 0840 07/22/22 1122  BP:   (!) 162/64 (!) 154/72  Pulse:   64 60  Resp:   18 18  Temp: 97.8 F (36.6 C) 97.7 F (36.5 C) 97.6 F (36.4 C) 97.6 F (36.4 C)  TempSrc: Oral Oral Axillary Axillary  SpO2:    99%  Weight:      Height:        General: Pt is alert, awake, not in acute distress Cardiovascular: RRR, S1/S2 +, no rubs, no gallops Respiratory: CTA bilaterally, no wheezing, no rhonchi Abdominal: Soft, NT, ND, bowel sounds + Extremities: no edema, no cyanosis  The results of significant diagnostics from this hospitalization (including imaging, microbiology, ancillary and laboratory) are listed below for reference.     Microbiology: No results found for this or any previous visit (from the past 240 hour(s)).   Labs: BNP (last 3 results) No results for input(s): "BNP" in the last 8760 hours. Basic Metabolic Panel: Recent Labs  Lab 07/20/22 1429 07/20/22 1706 07/21/22 0445 07/22/22 0707  NA 139 142 137 134*  K 3.7 3.7 3.5 3.8  CL 105 102 101 102  CO2 23  --  25 23  GLUCOSE 83 91 92 98  BUN 7* 7* 5* 6*  CREATININE 0.84 0.90 0.84 0.75  CALCIUM 9.0  --  8.4* 8.3*  MG  --   --   --  2.2   Liver Function Tests: Recent Labs  Lab 07/20/22 1429  AST 28  ALT 18  ALKPHOS 47  BILITOT 0.4  PROT 6.8  ALBUMIN 3.8   No results for input(s): "LIPASE", "AMYLASE" in the last 168 hours. No results for input(s): "AMMONIA" in the last 168 hours. CBC: Recent Labs  Lab 07/20/22 1429 07/20/22 1706 07/21/22 0445 07/22/22 0707  WBC 5.9  --   6.8 5.2  NEUTROABS 3.4  --   --   --   HGB 12.1 12.6 11.6* 12.6  HCT 36.5 37.0 35.4* 38.6  MCV 86.5  --  86.8 86.5  PLT 245  --  246 256   Cardiac Enzymes: No results for input(s): "CKTOTAL", "CKMB", "CKMBINDEX", "TROPONINI" in the last 168 hours. BNP: Invalid input(s): "POCBNP" CBG: No results for input(s): "GLUCAP" in the last 168 hours. D-Dimer No results for input(s): "DDIMER" in the last 72 hours. Hgb A1c Recent Labs    07/20/22 1429  HGBA1C 5.7*   Lipid Profile Recent Labs    07/20/22 1429  CHOL 156  HDL 41  LDLCALC 84  TRIG 153*  CHOLHDL 3.8   Thyroid function studies Recent Labs    07/20/22 1429  TSH 1.715   Anemia work up No results for input(s): "VITAMINB12", "FOLATE", "FERRITIN", "TIBC", "IRON", "RETICCTPCT" in the last 72 hours. Urinalysis    Component Value Date/Time   COLORURINE YELLOW 12/17/2015 1735   APPEARANCEUR CLEAR 12/17/2015 1735   LABSPEC 1.008 12/17/2015 1735   PHURINE 6.0 12/17/2015 1735   GLUCOSEU NEGATIVE 12/17/2015 1735   HGBUR NEGATIVE 12/17/2015 1735   BILIRUBINUR NEGATIVE 12/17/2015 1735   KETONESUR NEGATIVE 12/17/2015 1735   PROTEINUR NEGATIVE 12/17/2015 1735   NITRITE NEGATIVE 12/17/2015 1735  LEUKOCYTESUR SMALL (A) 12/17/2015 1735   Sepsis Labs Recent Labs  Lab 07/20/22 1429 07/21/22 0445 07/22/22 0707  WBC 5.9 6.8 5.2   Microbiology No results found for this or any previous visit (from the past 240 hour(s)).   Time coordinating discharge: Over 30 minutes  SIGNED:   Little Ishikawa, DO [facility] 07/22/2022, 2:12 PM Pager   If 7PM-7AM, please contact night-coverage [email or web address]

## 2022-07-22 NOTE — Progress Notes (Signed)
Physical Therapy Treatment Patient Details Name: Sara Frye MRN: [id number] DOB: 02/14/1929 Today's Date: 07/22/2022   History of Present Illness 88 y.o. female who presented 07/20/22 with difficulty talking, headache, and dizziness. CT head and CTA head and neck with no acute findings. MRI revealed small acute right cerebellar infarct  PMH significant of hypertension, hyperlipidemia, paroxysmal atrial fibrillation, CVA, cerebral aneurysm s/p coiling, and hypothyroidism    PT Comments    Pt greeted up in chair with daughter and spouse present and agreeable to session. Pt needing light min assist to come to stand and min guard throughout ambulation for 130' with rollator. Pt with max c/o dizziness at start of session however pt endorsing improvement and resolution of dizziness with standing activity/ambulation. Encouraged and educated pt re; multiple short bouts of activity throughout day, HEP, and benefits of continued mobility. Current plan remains appropriate to address deficits and maximize functional independence. Pt continues to benefit from skilled PT services to progress toward functional mobility goals.    Recommendations for follow up therapy are one component of a multi-disciplinary discharge planning process, led by the attending physician.  Recommendations may be updated based on patient status, additional functional criteria and insurance authorization.  Follow Up Recommendations  Home health PT     Assistance Recommended at Discharge Set up Supervision/Assistance  Patient can return home with the following Assistance with cooking/housework;Assist for transportation;Help with stairs or ramp for entrance   Equipment Recommendations  None recommended by PT    Recommendations for Other Services       Precautions / Restrictions Precautions Precautions: Fall Precaution Comments: gets dizzy with positional changes (- for BP changes) Restrictions Weight Bearing  Restrictions: No     Mobility  Bed Mobility Overal bed mobility: Needs Assistance             General bed mobility comments: up in recliner on arrival    Transfers Overall transfer level: Needs assistance Equipment used: Rollator (4 wheels) Transfers: Sit to/from Stand Sit to Stand: Min assist           General transfer comment: extra time and effort; light assisdt to power up and steady on rise    Ambulation/Gait Ambulation/Gait assistance: Min guard Gait Distance (Feet): 130 Feet Assistive device: Rollator (4 wheels) Gait Pattern/deviations: Step-through pattern Gait velocity: decr     General Gait Details: steady gait with rollator, no LOB, pt endorsing improvement in all symptoms with increase in distance   Stairs             Wheelchair Mobility    Modified Rankin (Stroke Patients Only) Modified Rankin (Stroke Patients Only) Pre-Morbid Rankin Score: Slight disability Modified Rankin: Moderately severe disability     Balance Overall balance assessment: Needs assistance Sitting-balance support: No upper extremity supported, Feet supported Sitting balance-Leahy Scale: Good Sitting balance - Comments: at Edge of chair   Standing balance support: Single extremity supported, During functional activity Standing balance-Leahy Scale: Poor                              Cognition Arousal/Alertness: Awake/alert Behavior During Therapy: WFL for tasks assessed/performed (talkative) Overall Cognitive Status: No family/caregiver present to determine baseline cognitive functioning                                 General Comments: tangential, can recall information; oriented to person, place,  situation (time NT) unsure of baseline        Exercises General Exercises - Lower Extremity Long Arc Quad: AROM, Right, Left, 20 reps, Seated Hip Flexion/Marching: AROM, Right, Left, 20 reps, Seated (pre gait for warm up)    General  Comments General comments (skin integrity, edema, etc.): encouraged pt to continue mobility/ambultaion throughout day as pt endorsing improvement in dizziness and all symptoms when up and moving around, pt in agreeable      Pertinent Vitals/Pain Pain Assessment Pain Assessment: Faces Faces Pain Scale: Hurts a little bit Pain Location: R knee (acute on chronic) Pain Descriptors / Indicators: Burning, Aching Pain Intervention(s): Monitored during session, Limited activity within patient's tolerance    Home Living                          Prior Function            PT Goals (current goals can now be found in the care plan section) Acute Rehab PT Goals PT Goal Formulation: With patient Time For Goal Achievement: 08/04/22 Progress towards PT goals: Progressing toward goals    Frequency    Min 3X/week      PT Plan      Co-evaluation              AM-PAC PT "6 Clicks" Mobility   Outcome Measure  Help needed turning from your back to your side while in a flat bed without using bedrails?: None Help needed moving from lying on your back to sitting on the side of a flat bed without using bedrails?: A Little Help needed moving to and from a bed to a chair (including a wheelchair)?: A Little Help needed standing up from a chair using your arms (e.g., wheelchair or bedside chair)?: A Little Help needed to walk in hospital room?: A Little Help needed climbing 3-5 steps with a railing? : A Little 6 Click Score: 19    End of Session Equipment Utilized During Treatment: Gait belt Activity Tolerance: Patient tolerated treatment well Patient left: in chair;with call bell/phone within reach;with family/visitor present Nurse Communication: Mobility status PT Visit Diagnosis: Unsteadiness on feet (R26.81)     Time: 5597-4163 PT Time Calculation (min) (ACUTE ONLY): 26 min  Charges:  $Gait Training: 8-22 mins $Therapeutic Exercise: 8-22 mins                      Shauntel Prest R. PTA Acute Rehabilitation Services Office: [place] 07/22/2022, 12:01 PM

## 2022-07-23 DIAGNOSIS — I63211 Cerebral infarction due to unspecified occlusion or stenosis of right vertebral arteries: Secondary | ICD-10-CM | POA: Diagnosis not present

## 2022-07-23 NOTE — Progress Notes (Signed)
Occupational Therapy Treatment Patient Details Name: Sara Frye MRN: [id number] DOB: July 02, 1929 Today's Date: 07/23/2022   History of present illness 88 y.o. female who presented 07/20/22 with difficulty talking, headache, and dizziness. CT head and CTA head and neck with no acute findings. MRI revealed small acute right cerebellar infarct  PMH significant of hypertension, hyperlipidemia, paroxysmal atrial fibrillation, CVA, cerebral aneurysm s/p coiling, and hypothyroidism   OT comments  Pt in chair and reporting she is ready to discharge home on arrival, but agreeable to OT education. Engaging pt in education regarding energy conservation/fatigue management in the home setting and pt very interactive reporting how she typically performs tasks at home; additionally receptive of education regarding energy conservation. Mildly tangential. Reported she had sock aid but did not know how to use, so OT providing demonstration, and then pt performing sitting in recliner with 1-2 cues for technique. Continue to recommend HHOT to optimize safety and independence in ADL and IADL.    Recommendations for follow up therapy are one component of a multi-disciplinary discharge planning process, led by the attending physician.  Recommendations may be updated based on patient status, additional functional criteria and insurance authorization.    Follow Up Recommendations  Home health OT     Assistance Recommended at Discharge Intermittent Supervision/Assistance  Patient can return home with the following  A little help with walking and/or transfers;A little help with bathing/dressing/bathroom;Assistance with cooking/housework;Direct supervision/assist for medications management;Assist for transportation;Help with stairs or ramp for entrance   Equipment Recommendations  None recommended by OT (pt has appropriate DME)    Recommendations for Other Services      Precautions / Restrictions  Precautions Precautions: Fall Precaution Comments: gets dizzy with positional changes (- for BP changes) Restrictions Weight Bearing Restrictions: No       Mobility Bed Mobility               General bed mobility comments: in recliner on arrival and departure    Transfers Overall transfer level: Needs assistance   Transfers: Sit to/from Stand Sit to Stand: Min guard           General transfer comment: increased time and effort     Balance Overall balance assessment: Needs assistance Sitting-balance support: No upper extremity supported, Feet supported Sitting balance-Leahy Scale: Good Sitting balance - Comments: at Edge of chair   Standing balance support: Single extremity supported, During functional activity Standing balance-Leahy Scale: Poor                             ADL either performed or assessed with clinical judgement   ADL Overall ADL's : Needs assistance/impaired                     Lower Body Dressing: Min guard;Sitting/lateral leans Lower Body Dressing Details (indicate cue type and reason): with use of AE. Pt reporting she did not know how to use her sock aid at home. OT providing education and pt performing             Functional mobility during ADLs: Min guard (just STS this session) General ADL Comments: Reviewing energy conservation handout with pt and pt very receptive, but also tangential at times.    Extremity/Trunk Assessment Upper Extremity Assessment Upper Extremity Assessment: Generalized weakness   Lower Extremity Assessment Lower Extremity Assessment: Generalized weakness        Vision       Perception  Praxis      Cognition Arousal/Alertness: Awake/alert Behavior During Therapy: WFL for tasks assessed/performed (talkative) Overall Cognitive Status: No family/caregiver present to determine baseline cognitive functioning                                 General Comments:  tangential, can recall information; oriented to person, place, situation (time NT) unsure of baseline. Pt aware daughter was coming. Daughter entering on OT departure. Offering to provide education, but daughter reports no questions as she was an Therapist, sports for 50 years.        Exercises      Shoulder Instructions       General Comments      Pertinent Vitals/ Pain       Pain Assessment Pain Assessment: Faces Faces Pain Scale: Hurts a little bit Pain Location: knees Pain Descriptors / Indicators: Tightness Pain Intervention(s): Limited activity within patient's tolerance, Monitored during session  Home Living                                          Prior Functioning/Environment              Frequency  Min 2X/week        Progress Toward Goals  OT Goals(current goals can now be found in the care plan section)  Progress towards OT goals: Progressing toward goals  Acute Rehab OT Goals Patient Stated Goal: go home OT Goal Formulation: With patient Time For Goal Achievement: 08/04/22 Potential to Achieve Goals: Good ADL Goals Pt Will Perform Grooming: with modified independence;standing Pt Will Perform Upper Body Dressing: with modified independence;sitting Pt Will Perform Lower Body Dressing: with supervision;sit to/from stand Pt Will Transfer to Toilet: with modified independence;ambulating Pt Will Perform Toileting - Clothing Manipulation and hygiene: with modified independence;sit to/from stand Additional ADL Goal #1: Pt will verbalize at least 3 strategies for energy conservation in ADL with no cues  Plan Discharge plan remains appropriate;Frequency remains appropriate    Co-evaluation                 AM-PAC OT "6 Clicks" Daily Activity     Outcome Measure   Help from another person eating meals?: A Little Help from another person taking care of personal grooming?: A Little Help from another person toileting, which includes using toliet,  bedpan, or urinal?: A Little Help from another person bathing (including washing, rinsing, drying)?: A Little Help from another person to put on and taking off regular upper body clothing?: A Little Help from another person to put on and taking off regular lower body clothing?: A Little 6 Click Score: 18    End of Session Equipment Utilized During Treatment: Gait belt;Rolling walker (2 wheels)  OT Visit Diagnosis: Other abnormalities of gait and mobility (R26.89);Unsteadiness on feet (R26.81);Other symptoms and signs involving the nervous system (R29.898)   Activity Tolerance Patient tolerated treatment well   Patient Left in chair;with call bell/phone within reach;with chair alarm set;with family/visitor present   Nurse Communication Mobility status        Time: 6948-5462 OT Time Calculation (min): 30 min  Charges: OT General Charges $OT Visit: 1 Visit OT Treatments $Self Care/Home Management : 23-37 mins  Elder Cyphers, OTR/L [facility] Acute Rehabilitation Office: [phone]   Magnus Ivan 07/23/2022, 1:06 PM

## 2022-07-23 NOTE — Progress Notes (Signed)
Physical Therapy Treatment Patient Details Name: Sara Frye MRN: [id number] DOB: 1928/11/11 Today's Date: 07/23/2022   History of Present Illness 88 y.o. female who presented 07/20/22 with difficulty talking, headache, and dizziness. CT head and CTA head and neck with no acute findings. MRI revealed small acute right cerebellar infarct  PMH significant of hypertension, hyperlipidemia, paroxysmal atrial fibrillation, CVA, cerebral aneurysm s/p coiling, and hypothyroidism    PT Comments    Pt continues to progress towards acute goals. Pt demonstrating increased tolerance for gait with no LOB, and good safety awareness. Pt able to ascent/descend steps without fault, however pt with heavy reliance of UE on rails. Pt continues to fatigue quickly needing seated recovery breaks intermittently throughout session, and continues to be limited by dizziness and general weakness. Anticipate safe pt discharge with assistance outlined below once medically cleared, will follow acutely.    Recommendations for follow up therapy are one component of a multi-disciplinary discharge planning process, led by the attending physician.  Recommendations may be updated based on patient status, additional functional criteria and insurance authorization.  Follow Up Recommendations  Home health PT     Assistance Recommended at Discharge Set up Supervision/Assistance  Patient can return home with the following Assistance with cooking/housework;Assist for transportation;Help with stairs or ramp for entrance   Equipment Recommendations  None recommended by PT    Recommendations for Other Services       Precautions / Restrictions Precautions Precautions: Fall Precaution Comments: gets dizzy with positional changes (- for BP changes) Restrictions Weight Bearing Restrictions: No     Mobility  Bed Mobility Overal bed mobility: Needs Assistance             General bed mobility comments: up in recliner  on arrival    Transfers Overall transfer level: Needs assistance Equipment used: Rollator (4 wheels) Transfers: Sit to/from Stand Sit to Stand: Min assist           General transfer comment: extra time and effort; light assisdt to power up and steady on rise, good power up from low commode    Ambulation/Gait Ambulation/Gait assistance: Min guard Gait Distance (Feet): 150 Feet Assistive device: Rollator (4 wheels) Gait Pattern/deviations: Step-through pattern Gait velocity: decr     General Gait Details: steady gait with rollator, no LOB, pt endorsing improvement in all symptoms with increase in distance   Stairs Stairs: Yes Stairs assistance: Min guard Stair Management: Two rails, Step to pattern, Forwards Number of Stairs: 2 General stair comments: pt with c/o increased dizziness on ascent, no LOB, hevy use of rails   Wheelchair Mobility    Modified Rankin (Stroke Patients Only) Modified Rankin (Stroke Patients Only) Pre-Morbid Rankin Score: Slight disability Modified Rankin: Moderately severe disability     Balance Overall balance assessment: Needs assistance Sitting-balance support: No upper extremity supported, Feet supported Sitting balance-Leahy Scale: Good Sitting balance - Comments: at Edge of chair   Standing balance support: Single extremity supported, During functional activity Standing balance-Leahy Scale: Poor Standing balance comment: single UE support as pulling up her depends after toileting                            Cognition Arousal/Alertness: Awake/alert Behavior During Therapy: WFL for tasks assessed/performed (talkative) Overall Cognitive Status: No family/caregiver present to determine baseline cognitive functioning  General Comments: tangential, can recall information; oriented to person, place, situation (time NT) unsure of baseline        Exercises      General  Comments        Pertinent Vitals/Pain Pain Assessment Pain Assessment: Faces Faces Pain Scale: Hurts a little bit Pain Location: knees Pain Descriptors / Indicators: Other (Comment) (stiff) Pain Intervention(s): Premedicated before session, Monitored during session, Limited activity within patient's tolerance    Home Living                          Prior Function            PT Goals (current goals can now be found in the care plan section) Acute Rehab PT Goals PT Goal Formulation: With patient Time For Goal Achievement: 08/04/22 Progress towards PT goals: Progressing toward goals    Frequency    Min 3X/week      PT Plan      Co-evaluation              AM-PAC PT "6 Clicks" Mobility   Outcome Measure  Help needed turning from your back to your side while in a flat bed without using bedrails?: None Help needed moving from lying on your back to sitting on the side of a flat bed without using bedrails?: A Little Help needed moving to and from a bed to a chair (including a wheelchair)?: A Little Help needed standing up from a chair using your arms (e.g., wheelchair or bedside chair)?: A Little Help needed to walk in hospital room?: A Little Help needed climbing 3-5 steps with a railing? : A Little 6 Click Score: 19    End of Session Equipment Utilized During Treatment: Gait belt Activity Tolerance: Patient tolerated treatment well Patient left: in chair;with call bell/phone within reach;with chair alarm set Nurse Communication: Mobility status PT Visit Diagnosis: Unsteadiness on feet (R26.81)     Time: 3536-1443 PT Time Calculation (min) (ACUTE ONLY): 21 min  Charges:  $Gait Training: 8-22 mins                     Audry Riles. PTA Acute Rehabilitation Services Office: [place] 07/23/2022, 10:04 AM

## 2022-07-23 NOTE — Plan of Care (Signed)
  Problem: Education: Goal: Knowledge of disease or condition will improve Outcome: Progressing   Problem: Self-Care: Goal: Ability to participate in self-care as condition permits will improve Outcome: Progressing   Problem: Nutrition: Goal: Dietary intake will improve Outcome: Progressing

## 2022-07-23 NOTE — Progress Notes (Signed)
Patient discharging to home.  Education provided.  Daughter/care giver is at the bedside.

## 2022-07-23 NOTE — Discharge Summary (Signed)
Physician Discharge Summary  Sara Frye LKG:[id number] DOB: 09-01-1928 DOA: 07/20/2022  PCP: Sara Pretty, MD  Admit date: 07/20/2022 Discharge date: 07/23/2022  Admitted From: Home Disposition: Home  Recommendations for Outpatient Follow-up:  Follow up with PCP in 1-2 weeks  Home Health: PT Equipment/Devices: No new equipment  Discharge Condition: Stable CODE STATUS: Full Diet recommendation: Regular diet as tolerated  Brief/Interim Summary: 88 y.o. female with medical history significant of hypertension, hyperlipidemia, paroxysmal atrial fibrillation, CVA, cerebral aneurysm s/p coiling, and hypothyroidism who presented with difficulty talking at 11 AM on 07/21/22.  Reported associated symptoms of mild headache and dizziness. Her daughter had left the patient in her normal state of health at around 10 AM to take her stepfather to a doctor's appointment.  She chronically has abdominal pain and back pain.  Her daughter makes note that she had been switched from metoprolol to lisinopril on 11/21 by cardiology after being found to be bradycardic with heart rates in 40s.  She was seen as a code stroke.  Was hospitalized for further management.   Patient admitted as above as code stroke with intermittent aphasia, MRI did show right-sided cerebellar infarct with subtle left-sided weakness.  Patient evaluated by PT recommending home health PT given her improving symptoms and otherwise stable ambulation.  Patient does admit to orthostatic symptoms of dizziness but this appears to be chronic, orthostatic blood pressures unremarkable.  Recommended to follow-up with PCP for ongoing evaluation given she is on chronic meclizine she understands this is a chronic issue and likely unrelated to her current diagnosis.  Neurology evaluating recommending additional 81 mg aspirin for stroke prevention as well as increased Crestor dosing.  Patient otherwise stable and agreeable for discharge  home.  *Family appealing discharge as they feel the patient is not ready for discharge yet despite ambulating quite well on her own with minimal assistance with physical therapy, neurology has signed off and otherwise medication changes have been outlined as above.  Will await appeal otherwise patient remains medically stable for discharge home.  Discharge Diagnoses:  Principal Problem:   CVA (cerebral vascular accident) [facility]) Active Problems:   Aphasia   Atrial fibrillation ([place])   Chronic anticoagulation   Essential hypertension   Hypercholesteremia   History of stroke   H/O cerebral aneurysm repair ACA S/P Coiling by Sara Frye 01/07/17   Hypothyroidism   Acute CVA (cerebrovascular accident) [facility])    Discharge Instructions  Discharge Instructions     Ambulatory referral to Neurology   Complete by: As directed    Follow up with stroke clinic NP (Sara Frye or Sara Frye, if both not available, consider Sara Frye, or Sara Frye) at [facility] in about 4 weeks. Thanks.        Follow-up Information     [facility]. Schedule an appointment as soon as possible for a visit in 1 month(s).   Specialty: Neurology Why: stroke clinic Contact information: [address] [place], [facility] Follow up.   Specialty: Home Health Services Why: The home health agency will contact you for the first home visit. Contact information: [address] [place] [address] [phone]                Allergies  Allergen Reactions   Shrimp (Diagnostic) Itching    Consultations: Neurology  Procedures/Studies: ECHOCARDIOGRAM COMPLETE  Result Date: 07/21/2022    ECHOCARDIOGRAM REPORT   Patient Name:  [name] Date of Exam: 07/21/2022 Medical Rec #:  [id number]          Height:       60.0 in Accession #:    [id number]         Weight:       177.2 lb Date of  Birth:  [birth year]          BSA:          1.773 m Patient Age:    55 years           BP:           146/102 mmHg Patient Gender: F                  HR:           73 bpm. Exam Location:  Inpatient Procedure: 2D Echo Indications:    Stroke I63.9  History:        Patient has prior history of Echocardiogram examinations, most                 recent 11/06/2020. CVA and Stroke, Arrythmias:Atrial Fibrillation                 and Bradycardia; Risk Factors:Hypertension, Dyslipidemia and                 Non-Smoker.  Sonographer:    Sara Frye Referring Phys: Sara Frye  Sonographer Comments: Image acquisition challenging due to patient body habitus and Image acquisition challenging due to respiratory motion. IMPRESSIONS  1. Left ventricular ejection fraction, by estimation, is 65 to 70%. The left ventricle has normal function. The left ventricle has no regional wall motion abnormalities. There is mild concentric left ventricular hypertrophy. Left ventricular diastolic parameters are consistent with Grade II diastolic dysfunction (pseudonormalization). Elevated left atrial pressure.  2. Right ventricular systolic function is normal. The right ventricular size is normal. Tricuspid regurgitation signal is inadequate for assessing PA pressure.  3. Left atrial size was mildly dilated.  4. The mitral valve is degenerative. Mild mitral valve regurgitation. Mild mitral stenosis. The mean mitral valve gradient is 4.5 mmHg with average heart rate of 72 bpm. Severe mitral annular calcification.  5. The aortic valve is grossly normal. Aortic valve regurgitation is not visualized. No aortic stenosis is present.  6. The inferior vena cava is normal in size with greater than 50% respiratory variability, suggesting right atrial pressure of 3 mmHg. Comparison(s): Prior images unable to be directly viewed, comparison made by report only. FINDINGS  Left Ventricle: Left ventricular ejection fraction, by estimation, is 65 to 70%. The left  ventricle has normal function. The left ventricle has no regional wall motion abnormalities. The left ventricular internal cavity size was normal in size. There is  mild concentric left ventricular hypertrophy. Left ventricular diastolic parameters are consistent with Grade II diastolic dysfunction (pseudonormalization). Elevated left atrial pressure. Right Ventricle: The right ventricular size is normal. No increase in right ventricular wall thickness. Right ventricular systolic function is normal. Tricuspid regurgitation signal is inadequate for assessing PA pressure. Left Atrium: Left atrial size was mildly dilated. Right Atrium: Right atrial size was normal in size. Pericardium: There is no evidence of pericardial effusion. Mitral Valve: The mitral valve is degenerative in appearance. Severe mitral annular calcification. Mild mitral valve regurgitation. Mild mitral valve stenosis. The mean mitral valve gradient is 4.5 mmHg with average heart rate of 72 bpm. Tricuspid Valve: The tricuspid valve is grossly normal. Tricuspid valve  regurgitation is not demonstrated. Aortic Valve: The aortic valve is grossly normal. Aortic valve regurgitation is not visualized. No aortic stenosis is present. Pulmonic Valve: The pulmonic valve was not well visualized. Aorta: The aortic root and ascending aorta are structurally normal, with no evidence of dilitation. Venous: The inferior vena cava is normal in size with greater than 50% respiratory variability, suggesting right atrial pressure of 3 mmHg. IAS/Shunts: No atrial level shunt detected by color flow Doppler.  LEFT VENTRICLE PLAX 2D LVIDd:         3.50 cm   Diastology LVIDs:         2.70 cm   LV e' medial:    7.07 cm/s LV PW:         1.00 cm   LV E/e' medial:  20.1 LV IVS:        1.30 cm   LV e' lateral:   6.85 cm/s LVOT diam:     1.60 cm   LV E/e' lateral: 20.7 LVOT Area:     2.01 cm  RIGHT VENTRICLE RV S prime:     10.70 cm/s TAPSE (M-mode): 2.1 cm LEFT ATRIUM               Index        RIGHT ATRIUM           Index LA diam:        4.30 cm  2.42 cm/m   RA Area:     16.40 cm LA Vol (A2C):   119.0 ml 67.11 ml/m  RA Volume:   36.10 ml  20.36 ml/m LA Vol (A4C):   94.0 ml  53.01 ml/m LA Biplane Vol: 109.0 ml 61.47 ml/m   AORTA Ao Root diam: 3.25 cm Ao Asc diam:  3.60 cm MITRAL VALVE MV Area (PHT): 3.21 cm     SHUNTS MV Mean grad:  4.5 mmHg     Systemic Diam: 1.60 cm MV Decel Time: 236 msec MR Peak grad: 69.4 mmHg MR Vmax:      416.50 cm/s MV E velocity: 142.00 cm/s MV A velocity: 135.00 cm/s MV E/A ratio:  1.05 Mihai Croitoru MD Electronically signed by Sanda Klein MD Signature Date/Time: 07/21/2022/5:56:48 PM    Final    MR BRAIN WO CONTRAST  Result Date: 07/20/2022 CLINICAL DATA:  Neuro deficit, acute, stroke suspected. Aphasia and headache. EXAM: MRI HEAD WITHOUT CONTRAST TECHNIQUE: Multiplanar, multiecho pulse sequences of the brain and surrounding structures were obtained without intravenous contrast. COMPARISON:  Head CT and CTA 07/20/2022.  Head MRI 05/03/2020. FINDINGS: Brain: There is a 1 cm acute infarct in the right cerebellar hemisphere. No acute supratentorial infarct, intracranial hemorrhage, mass, midline shift, or extra-axial fluid collection is identified. T2 hyperintensities in the cerebral white matter bilaterally are unchanged from the prior MRI and are nonspecific but compatible with moderate chronic small vessel ischemic disease. Small chronic bilateral parietal and left cerebellar infarcts are also unchanged. Cerebral atrophy is mild for age. Vascular: More fully evaluated on today's CTA. Skull and upper cervical spine: No suspicious marrow lesion. Asymmetrically advanced left-sided facet arthrosis in the upper cervical spine. Sinuses/Orbits: Left cataract extraction. Mild mucosal thickening in the ethmoid and sphenoid sinuses. No significant mastoid fluid. Other: None. IMPRESSION: 1. Small acute right cerebellar infarct. 2. Moderate chronic small vessel  ischemic disease. Electronically Signed   By: Logan Bores M.D.   On: 07/20/2022 18:26   CT HEAD CODE STROKE WO CONTRAST  Result Date: 07/20/2022 CLINICAL DATA:  Code  stroke.  TIA.  Headache and slurred speech. EXAM: CT ANGIOGRAPHY HEAD AND NECK TECHNIQUE: Multidetector CT imaging of the head and neck was performed using the standard protocol during bolus administration of intravenous contrast. Multiplanar CT image reconstructions and MIPs were obtained to evaluate the vascular anatomy. Carotid stenosis measurements (when applicable) are obtained utilizing NASCET criteria, using the distal internal carotid diameter as the denominator. RADIATION DOSE REDUCTION: This exam was performed according to the departmental dose-optimization program which includes automated exposure control, adjustment of the mA and/or kV according to patient size and/or use of iterative reconstruction technique. CONTRAST:  76m OMNIPAQUE IOHEXOL 350 MG/ML SOLN COMPARISON:  MR and MRA head 05/03/2020 FINDINGS: CT HEAD FINDINGS Brain: There is no acute intracranial hemorrhage, extra-axial fluid collection, or acute infarct Parenchymal volume is within expected limits for age. The ventricles are normal in size. There is a small area of cortical encephalomalacia in the high left parietal lobe, unchanged. Additional patchy and confluence hypodensity in the supratentorial white matter likely reflects sequela of chronic small-vessel ischemic change. There is no mass lesion. There is no mass effect or midline shift. Vascular: Postsurgical changes reflecting prior A-comm aneurysm treatment are noted. No hyperdense vessel is seen. Skull: Normal. Negative for fracture or focal lesion. Sinuses/Orbits: The paranasal sinuses are clear. A left lens implant is noted. The globes and orbits are otherwise unremarkable. Other: None. ASPECTS ([facility]) - Ganglionic level infarction (caudate, lentiform nuclei, internal capsule,  insula, M1-M3 cortex): 7 - Supraganglionic infarction (M4-M6 cortex): 3 Total score (0-10 with 10 being normal): 10 CTA NECK FINDINGS Aortic arch: There is mild calcified plaque in the imaged aortic arch. The origins of the major branch vessels are patent. The subclavian arteries are patent to the level imaged. Right carotid system: The right common, internal, and external carotid arteries are patent, with mild plaque at the bifurcation but no hemodynamically significant stenosis or occlusion. There is no dissection or aneurysm. Left carotid system: The left common, internal, and external carotid arteries are patent, with mild plaque at the bifurcation but no hemodynamically significant stenosis or occlusion. There is no dissection or aneurysm. Vertebral arteries: The vertebral arteries are patent, without hemodynamically significant stenosis or occlusion. There is no dissection or aneurysm. Skeleton: There is overall mild for age degenerative change of the cervical spine. There is no acute osseous abnormality or suspicious osseous lesion. There is no visible canal hematoma. Other neck: The soft tissues of the neck are unremarkable. Upper chest: The imaged lung apices are clear. Review of the MIP images confirms the above findings CTA HEAD FINDINGS Anterior circulation: There is mild calcified plaque in the carotid siphons without hemodynamically significant stenosis. The bilateral MCAs are patent, without proximal stenosis or occlusion. 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 Streak artifact degrades evaluation of the surrounding vasculature. The left A1 segment is patent. The ACA is beyond the stent and coil pack appear patent, without proximal high-grade stenosis or occlusion. There is no definite evidence of residual or recurrent aneurysm. There is no new aneurysm. Posterior circulation: The right V4 segment is occluded after  the PICA origin, similar to the prior MRA. The left V4 segment is widely patent. PICA is identified bilaterally. The basilar artery is patent. The bilateral PCAs are patent, without proximal stenosis or occlusion. Bilateral posterior communicating arteries are identified. There is no aneurysm or AVM. Venous sinuses: As permitted by contrast timing, patent. Anatomic variants:  None. Review of the MIP images confirms the above findings IMPRESSION: 1. No acute intracranial pathology. 2. No emergent large vessel occlusion. 3. 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 4. Mild calcified plaque at the carotid bifurcations and siphons without hemodynamically significant stenosis or occlusion. 5. 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 No definite evidence of recurrent or residual aneurysm though evaluation is degraded, by streak artifact. Findings were discussed with Dr. Curly Shores at 3:02pm. Electronically Signed   By: Valetta Mole M.D.   On: 07/20/2022 15:17   CT ANGIO HEAD NECK W WO CM (CODE STROKE)  Result Date: 07/20/2022 CLINICAL DATA:  Code stroke.  TIA.  Headache and slurred speech. EXAM: CT ANGIOGRAPHY HEAD AND NECK TECHNIQUE: Multidetector CT imaging of the head and neck was performed using the standard protocol during bolus administration of intravenous contrast. Multiplanar CT image reconstructions and MIPs were obtained to evaluate the vascular anatomy. Carotid stenosis measurements (when applicable) are obtained utilizing NASCET criteria, using the distal internal carotid diameter as the denominator. RADIATION DOSE REDUCTION: This exam was performed according to the departmental dose-optimization program which includes automated exposure control, adjustment of the mA and/or kV according to patient size and/or use of iterative reconstruction technique. CONTRAST:  40m OMNIPAQUE IOHEXOL 350 MG/ML SOLN  COMPARISON:  MR and MRA head 05/03/2020 FINDINGS: CT HEAD FINDINGS Brain: There is no acute intracranial hemorrhage, extra-axial fluid collection, or acute infarct Parenchymal volume is within expected limits for age. The ventricles are normal in size. There is a small area of cortical encephalomalacia in the high left parietal lobe, unchanged. Additional patchy and confluence hypodensity in the supratentorial white matter likely reflects sequela of chronic small-vessel ischemic change. There is no mass lesion. There is no mass effect or midline shift. Vascular: Postsurgical changes reflecting prior A-comm aneurysm treatment are noted. No hyperdense vessel is seen. Skull: Normal. Negative for fracture or focal lesion. Sinuses/Orbits: The paranasal sinuses are clear. A left lens implant is noted. The globes and orbits are otherwise unremarkable. Other: None. ASPECTS ([facility] Score) - Ganglionic level infarction (caudate, lentiform nuclei, internal capsule, insula, M1-M3 cortex): 7 - Supraganglionic infarction (M4-M6 cortex): 3 Total score (0-10 with 10 being normal): 10 CTA NECK FINDINGS Aortic arch: There is mild calcified plaque in the imaged aortic arch. The origins of the major branch vessels are patent. The subclavian arteries are patent to the level imaged. Right carotid system: The right common, internal, and external carotid arteries are patent, with mild plaque at the bifurcation but no hemodynamically significant stenosis or occlusion. There is no dissection or aneurysm. Left carotid system: The left common, internal, and external carotid arteries are patent, with mild plaque at the bifurcation but no hemodynamically significant stenosis or occlusion. There is no dissection or aneurysm. Vertebral arteries: The vertebral arteries are patent, without hemodynamically significant stenosis or occlusion. There is no dissection or aneurysm. Skeleton: There is overall mild for age  degenerative change of the cervical spine. There is no acute osseous abnormality or suspicious osseous lesion. There is no visible canal hematoma. Other neck: The soft tissues of the neck are unremarkable. Upper chest: The imaged lung apices are clear. Review of the MIP images confirms the above findings CTA HEAD FINDINGS Anterior circulation: There is mild calcified plaque in the carotid siphons without hemodynamically significant stenosis. The bilateral MCAs are patent, without proximal stenosis or occlusion. Postprocedural changes reflecting stent assisted coil  embolization of an anterior communicating artery aneurysm are again seen. Streak artifact degrades evaluation of the surrounding vasculature. The left A1 segment is patent. The ACA is beyond the stent and coil pack appear patent, without proximal high-grade stenosis or occlusion. There is no definite evidence of residual or recurrent aneurysm. There is no new aneurysm. Posterior circulation: The right V4 segment is occluded after the PICA origin, similar to the prior MRA. The left V4 segment is widely patent. PICA is identified bilaterally. The basilar artery is patent. The bilateral PCAs are patent, without proximal stenosis or occlusion. Bilateral posterior communicating arteries are identified. There is no aneurysm or AVM. Venous sinuses: As permitted by contrast timing, patent. Anatomic variants: None. Review of the MIP images confirms the above findings IMPRESSION: 1. No acute intracranial pathology. 2. No emergent large vessel occlusion. 3. 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 4. Mild calcified plaque at the carotid bifurcations and siphons without hemodynamically significant stenosis or occlusion. 5. 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 No definite evidence of recurrent or residual aneurysm though evaluation is degraded, by streak  artifact. Findings were discussed with Dr. Curly Shores at 3:02pm. Electronically Signed   By: Valetta Mole M.D.   On: 07/20/2022 15:17     Subjective: No acute issues or events overnight, patient complaining of orthostatic dizziness which appears to be chronic.  Declines any further weakness speech impediment or other deficits.   Discharge Exam: Vitals:   07/23/22 0500 07/23/22 0800  BP: (!) 123/58 124/81  Pulse: 90 81  Resp: 18 20  Temp: (!) 97.4 F (36.3 C) 97.8 F (36.6 C)  SpO2: 95% 97%   Vitals:   07/22/22 2028 07/23/22 0038 07/23/22 0500 07/23/22 0800  BP: 136/61 (!) 142/122 (!) 123/58 124/81  Pulse: 69 (!) 59 90 81  Resp: '16 17 18 20  '$ Temp: 98.3 F (36.8 C) (!) 97.5 F (36.4 C) (!) 97.4 F (36.3 C) 97.8 F (36.6 C)  TempSrc: Oral Axillary Oral Oral  SpO2: 96% 97% 95% 97%  Weight:      Height:        General: Pt is alert, awake, not in acute distress Cardiovascular: RRR, S1/S2 +, no rubs, no gallops Respiratory: CTA bilaterally, no wheezing, no rhonchi Abdominal: Soft, NT, ND, bowel sounds + Extremities: no edema, no cyanosis  The results of significant diagnostics from this hospitalization (including imaging, microbiology, ancillary and laboratory) are listed below for reference.     Microbiology: No results found for this or any previous visit (from the past 240 hour(s)).   Labs: BNP (last 3 results) No results for input(s): "BNP" in the last 8760 hours. Basic Metabolic Panel: Recent Labs  Lab 07/20/22 1429 07/20/22 1706 07/21/22 0445 07/22/22 0707  NA 139 142 137 134*  K 3.7 3.7 3.5 3.8  CL 105 102 101 102  CO2 23  --  25 23  GLUCOSE 83 91 92 98  BUN 7* 7* 5* 6*  CREATININE 0.84 0.90 0.84 0.75  CALCIUM 9.0  --  8.4* 8.3*  MG  --   --   --  2.2   Liver Function Tests: Recent Labs  Lab 07/20/22 1429  AST 28  ALT 18  ALKPHOS 47  BILITOT 0.4  PROT 6.8  ALBUMIN 3.8   No results for input(s): "LIPASE", "AMYLASE" in the last 168 hours. No  results for input(s): "AMMONIA" in the last 168 hours. CBC:  Recent Labs  Lab 07/20/22 1429 07/20/22 1706 07/21/22 0445 07/22/22 0707  WBC 5.9  --  6.8 5.2  NEUTROABS 3.4  --   --   --   HGB 12.1 12.6 11.6* 12.6  HCT 36.5 37.0 35.4* 38.6  MCV 86.5  --  86.8 86.5  PLT 245  --  246 256   Cardiac Enzymes: No results for input(s): "CKTOTAL", "CKMB", "CKMBINDEX", "TROPONINI" in the last 168 hours. BNP: Invalid input(s): "POCBNP" CBG: No results for input(s): "GLUCAP" in the last 168 hours. D-Dimer No results for input(s): "DDIMER" in the last 72 hours. Hgb A1c Recent Labs    07/20/22 1429  HGBA1C 5.7*   Lipid Profile Recent Labs    07/20/22 1429  CHOL 156  HDL 41  LDLCALC 84  TRIG 153*  CHOLHDL 3.8   Thyroid function studies Recent Labs    07/20/22 1429  TSH 1.715   Anemia work up No results for input(s): "VITAMINB12", "FOLATE", "FERRITIN", "TIBC", "IRON", "RETICCTPCT" in the last 72 hours. Urinalysis    Component Value Date/Time   COLORURINE YELLOW 12/17/2015 1735   APPEARANCEUR CLEAR 12/17/2015 1735   LABSPEC 1.008 12/17/2015 1735   PHURINE 6.0 12/17/2015 1735   GLUCOSEU NEGATIVE 12/17/2015 1735   HGBUR NEGATIVE 12/17/2015 1735   BILIRUBINUR NEGATIVE 12/17/2015 1735   KETONESUR NEGATIVE 12/17/2015 1735   PROTEINUR NEGATIVE 12/17/2015 1735   NITRITE NEGATIVE 12/17/2015 1735   LEUKOCYTESUR SMALL (A) 12/17/2015 1735   Sepsis Labs Recent Labs  Lab 07/20/22 1429 07/21/22 0445 07/22/22 0707  WBC 5.9 6.8 5.2   Microbiology No results found for this or any previous visit (from the past 240 hour(s)).   Time coordinating discharge: Over 30 minutes  SIGNED:   Little Ishikawa, DO [facility] 07/23/2022, 12:17 PM Pager   If 7PM-7AM, please contact night-coverage [email or web address]

## 2022-07-23 NOTE — TOC Transition Note (Signed)
Transition of Care [facility]) - CM/SW Discharge Note   Patient Details  Name: Sara Frye MRN: [id number] Date of Birth: Aug 22, 1929  Transition of Care [facility]) CM/SW Contact:  Pollie Friar, RN Phone Number: 07/23/2022, 12:34 PM   Clinical Narrative:    Pt's daughter feels comfortable taking her home today.  Daughter will transport the patient home.    Final next level of care: Home w Home Health Services Barriers to Discharge: No Barriers Identified   Patient Goals and CMS Choice   CMS Medicare.gov Compare Post Acute Care list provided to:: Patient Choice offered to / list presented to : Patient  Discharge Placement                       Discharge Plan and Services                          HH Arranged: PT, OT, Nurse's Aide [place] Agency: [place] Date [facility] Agency Contacted: 07/22/22   Representative spoke with at [place]: [place] ([place]) Interventions     Readmission Risk Interventions     No data to display

## 2022-07-23 NOTE — Plan of Care (Signed)
  Problem: Education: Goal: Knowledge of disease or condition will improve Outcome: Progressing Goal: Knowledge of secondary prevention will improve (MUST DOCUMENT ALL) Outcome: Progressing Goal: Knowledge of patient specific risk factors will improve [place] N/A or DELETE if not current risk factor) Outcome: Progressing   Problem: Ischemic Stroke/TIA Tissue Perfusion: Goal: Complications of ischemic stroke/TIA will be minimized Outcome: Progressing   Problem: Coping: Goal: Will verbalize positive feelings about self Outcome: Progressing Goal: Will identify appropriate support needs Outcome: Progressing   Problem: Health Behavior/Discharge Planning: Goal: Ability to manage health-related needs will improve Outcome: Progressing Goal: Goals will be collaboratively established with patient/family Outcome: Progressing   Problem: Self-Care: Goal: Ability to participate in self-care as condition permits will improve Outcome: Progressing Goal: Verbalization of feelings and concerns over difficulty with self-care will improve Outcome: Progressing Goal: Ability to communicate needs accurately will improve Outcome: Progressing   Problem: Nutrition: Goal: Risk of aspiration will decrease Outcome: Progressing Goal: Dietary intake will improve Outcome: Progressing   Problem: Education: Goal: Knowledge of General Education information will improve Description: Including pain rating scale, medication(s)/side effects and non-pharmacologic comfort measures Outcome: Progressing   Problem: Health Behavior/Discharge Planning: Goal: Ability to manage health-related needs will improve Outcome: Progressing   Problem: Clinical Measurements: Goal: Ability to maintain clinical measurements within normal limits will improve Outcome: Progressing Goal: Will remain free from infection Outcome: Progressing Goal: Diagnostic test results will improve Outcome: Progressing Goal: Respiratory  complications will improve Outcome: Progressing Goal: Cardiovascular complication will be avoided Outcome: Progressing   Problem: Activity: Goal: Risk for activity intolerance will decrease Outcome: Progressing   Problem: Nutrition: Goal: Adequate nutrition will be maintained Outcome: Progressing   Problem: Coping: Goal: Level of anxiety will decrease Outcome: Progressing   Problem: Elimination: Goal: Will not experience complications related to bowel motility Outcome: Progressing Goal: Will not experience complications related to urinary retention Outcome: Progressing   Problem: Pain Managment: Goal: General experience of comfort will improve Outcome: Progressing   Problem: Safety: Goal: Ability to remain free from injury will improve Outcome: Progressing   Problem: Skin Integrity: Goal: Risk for impaired skin integrity will decrease Outcome: Progressing   Problem: Education: Goal: Knowledge of secondary prevention will improve (MUST DOCUMENT ALL) Outcome: Progressing

## 2022-07-29 DIAGNOSIS — G8929 Other chronic pain: Secondary | ICD-10-CM | POA: Diagnosis not present

## 2022-07-29 DIAGNOSIS — R42 Dizziness and giddiness: Secondary | ICD-10-CM | POA: Diagnosis not present

## 2022-07-29 DIAGNOSIS — E039 Hypothyroidism, unspecified: Secondary | ICD-10-CM | POA: Diagnosis not present

## 2022-07-29 DIAGNOSIS — M549 Dorsalgia, unspecified: Secondary | ICD-10-CM | POA: Diagnosis not present

## 2022-07-29 DIAGNOSIS — I69354 Hemiplegia and hemiparesis following cerebral infarction affecting left non-dominant side: Secondary | ICD-10-CM | POA: Diagnosis not present

## 2022-07-29 DIAGNOSIS — Z9181 History of falling: Secondary | ICD-10-CM | POA: Diagnosis not present

## 2022-07-29 DIAGNOSIS — Z7982 Long term (current) use of aspirin: Secondary | ICD-10-CM | POA: Diagnosis not present

## 2022-07-29 DIAGNOSIS — I69320 Aphasia following cerebral infarction: Secondary | ICD-10-CM | POA: Diagnosis not present

## 2022-07-29 DIAGNOSIS — I10 Essential (primary) hypertension: Secondary | ICD-10-CM | POA: Diagnosis not present

## 2022-07-29 DIAGNOSIS — Z7901 Long term (current) use of anticoagulants: Secondary | ICD-10-CM | POA: Diagnosis not present

## 2022-07-29 DIAGNOSIS — R109 Unspecified abdominal pain: Secondary | ICD-10-CM | POA: Diagnosis not present

## 2022-07-29 DIAGNOSIS — Z8679 Personal history of other diseases of the circulatory system: Secondary | ICD-10-CM | POA: Diagnosis not present

## 2022-07-29 DIAGNOSIS — I480 Paroxysmal atrial fibrillation: Secondary | ICD-10-CM | POA: Diagnosis not present

## 2022-07-29 DIAGNOSIS — E7800 Pure hypercholesterolemia, unspecified: Secondary | ICD-10-CM | POA: Diagnosis not present

## 2022-07-30 ENCOUNTER — Telehealth: Payer: Self-pay

## 2022-07-30 NOTE — Patient Outreach (Signed)
  EMMI Stroke Care Coordination Follow Up  07/30/2022 Name:  GAYLIA KASSEL MRN:  [id number] DOB:  1928-11-12  Subjective: Sara Frye is a 88 y.o. year old female who is a primary care patient of Deland Pretty, MD   No EMMI alert was received but patient called back requesting to speak with a nurse. I reached out by phone to follow up on the alert and spoke to Patient. She voices that she is recovering from stroke. She is not up moving around much as she is having some pain to her lower back. Patient reports she was experiencing this pain while in the hospital and cause was unable to be determined. Denies any recent falls or injuries. She has been taking some Tylenol prn. Discussed non-pharmacological measures for patient to try. Patient states PCP appt is not until 08/12/22-she did not make appt-states [facility] staff made appt for her. She will call office to see if MD has any sooner appts and to discuss back pain. Patient has not made stroke follow up appt-states she was not aware. Reviewed d/c instructions and patient confirms she has paperwork. She will call both offices today to follow up. She confirms she has all her meds-no issues with them. She has spouse in the home and daughter that lives nearby to assist her. No issues with transportation-daughter will take her to appts. She denies any other RN CM needs or concerns at this time.  Care Coordination Interventions:  Yes, provided   Follow up plan: No further intervention required. Advised patient that they would continue to get automated EMMI-Stroke post discharge calls to assess how they are doing following recent hospitalization and will receive a call from a nurse if any of their responses were abnormal. Patient voiced understanding and was appreciative of f/u call.  Encounter Outcome:  Pt. Visit Completed   Enzo Montgomery, RN,BSN,CCM [place] [facility] Telephonic Care Management Coordinator Direct Phone: [phone] Toll  Free: [phone] Fax: [phone]

## 2022-07-31 DIAGNOSIS — I480 Paroxysmal atrial fibrillation: Secondary | ICD-10-CM | POA: Diagnosis not present

## 2022-07-31 DIAGNOSIS — I10 Essential (primary) hypertension: Secondary | ICD-10-CM | POA: Diagnosis not present

## 2022-07-31 DIAGNOSIS — I69354 Hemiplegia and hemiparesis following cerebral infarction affecting left non-dominant side: Secondary | ICD-10-CM | POA: Diagnosis not present

## 2022-07-31 DIAGNOSIS — M549 Dorsalgia, unspecified: Secondary | ICD-10-CM | POA: Diagnosis not present

## 2022-07-31 DIAGNOSIS — I69320 Aphasia following cerebral infarction: Secondary | ICD-10-CM | POA: Diagnosis not present

## 2022-07-31 DIAGNOSIS — R109 Unspecified abdominal pain: Secondary | ICD-10-CM | POA: Diagnosis not present

## 2022-08-03 ENCOUNTER — Ambulatory Visit: Payer: Self-pay | Admitting: *Deleted

## 2022-08-03 DIAGNOSIS — R5381 Other malaise: Secondary | ICD-10-CM | POA: Diagnosis not present

## 2022-08-03 DIAGNOSIS — M545 Low back pain, unspecified: Secondary | ICD-10-CM | POA: Diagnosis not present

## 2022-08-03 DIAGNOSIS — R627 Adult failure to thrive: Secondary | ICD-10-CM | POA: Diagnosis not present

## 2022-08-03 NOTE — Patient Outreach (Signed)
  Care Coordination   Initial Visit Note   08/03/2022 Name: Sara Frye MRN: [id number] DOB: [birth year]  Sara Frye is a 88 y.o. year old female who sees Deland Pretty, MD for primary care. I  spoke with pt's daughter, Sara Frye, today by phone. Per daughter, she does not think her mother wants to pursue placement now- apparently there  was conversation with pt and her son last week about it.  CSW advised daughter that based on PT notes from pt's recent hospitalization she does not need SNF rehab- per records, pt walked min-guard 130'. Per daughter, pt and her husband (daughter's step dad) live independently and daughter assists- planning to pursue some private duty care in home for them. Pt also has HHPT with [facility]. Suggested to daughter to inquire with them about their recommendations for needs/level of care Daughter will contact CSW if further needs arise. Pt to see PCP today.   What matters to the patients health and wellness today?  Care and support in the home.    Goals Addressed   None     SDOH assessments and interventions completed:  Yes  SDOH Interventions Today    Flowsheet Row Most Recent Value  SDOH Interventions   Food Insecurity Interventions Intervention Not Indicated  Housing Interventions Intervention Not Indicated  Transportation Interventions Intervention Not Indicated  Physical Activity Interventions Intervention Not Indicated  [Pt is receiving HHPT with [place] Coordination Interventions:  Yes, provided   Follow up plan: No further intervention required.   Encounter Outcome:  Pt. Visit Completed

## 2022-08-04 DIAGNOSIS — R109 Unspecified abdominal pain: Secondary | ICD-10-CM | POA: Diagnosis not present

## 2022-08-04 DIAGNOSIS — I480 Paroxysmal atrial fibrillation: Secondary | ICD-10-CM | POA: Diagnosis not present

## 2022-08-04 DIAGNOSIS — I10 Essential (primary) hypertension: Secondary | ICD-10-CM | POA: Diagnosis not present

## 2022-08-04 DIAGNOSIS — M549 Dorsalgia, unspecified: Secondary | ICD-10-CM | POA: Diagnosis not present

## 2022-08-04 DIAGNOSIS — I69354 Hemiplegia and hemiparesis following cerebral infarction affecting left non-dominant side: Secondary | ICD-10-CM | POA: Diagnosis not present

## 2022-08-04 DIAGNOSIS — I69320 Aphasia following cerebral infarction: Secondary | ICD-10-CM | POA: Diagnosis not present

## 2022-08-05 ENCOUNTER — Ambulatory Visit: Payer: Self-pay

## 2022-08-05 ENCOUNTER — Encounter: Payer: Self-pay | Admitting: Orthopedic Surgery

## 2022-08-05 ENCOUNTER — Ambulatory Visit (INDEPENDENT_AMBULATORY_CARE_PROVIDER_SITE_OTHER): Payer: Medicare Other | Admitting: Orthopedic Surgery

## 2022-08-05 VITALS — BP 119/76 | HR 91 | Ht 60.0 in | Wt 177.0 lb

## 2022-08-05 DIAGNOSIS — M545 Low back pain, unspecified: Secondary | ICD-10-CM

## 2022-08-05 DIAGNOSIS — M546 Pain in thoracic spine: Secondary | ICD-10-CM | POA: Diagnosis not present

## 2022-08-05 MED ORDER — TIZANIDINE HCL 4 MG PO TABS: 4.0000 mg | ORAL_TABLET | Freq: Three times a day (TID) | ORAL | 0 refills | Status: AC | PRN

## 2022-08-05 MED ORDER — METHOCARBAMOL 500 MG PO TABS: 500.0000 mg | ORAL_TABLET | Freq: Four times a day (QID) | ORAL | 0 refills | Status: DC

## 2022-08-05 NOTE — Progress Notes (Addendum)
Orthopedic Spine Surgery Office Note  Assessment: Patient is a 88 y.o. female with mid thoracic back pain with TTP over the midline structures. Possible compression fracture seen on XR   Plan: -No acute operative intervention -Instructed her to decrease the tylenol usage. She is using '6000mg'$  daily. Told her to decrease to '4000mg'$  per day. She can use tizanidine that I prescribed her to help decrease the tylenol. She can also use voltaren gel over the affected area for additional pain relief -No bending/lifting/twisting greater than 10 pounds -Ordered a CT to better evaluate for a compression fracture -Patient should return to office in 4 weeks, repeat x-rays of lumbar spine at next visit: none   Patient expressed understanding of the plan and all questions were answered to the patient's satisfaction.   ___________________________________________________________________________   History:  Patient is a 88 y.o. female who presents today for lumbar spine.  Patient had a stroke within the last month and has noted acute onset of thoracic back pain within the last 3 weeks.  Pain is felt on a daily basis.  It does not radiate anywhere.  It is located in the midline of her thoracic spine around the inferior angle of the scapula.  She states that she has been able to walk without any changes.  She has used a walker for a long time and still uses that.  She did not have any falls or trauma that preceded the pain.  She denies paresthesias and numbness.   Weakness: denies Symptoms of imbalance: no, but has used a walker for awhile to prevent falls Paresthesias and numbness: denies Bowel or bladder incontinence: denies Saddle anesthesia: denies  Treatments tried: tylenol, activity modification  Review of systems: Denies fevers and chills, night sweats, unexplained weight loss, history of cancer. Has had pain that wakes her at night   Past medical history: Osteoarthritis Atrial  fibrillation Brain aneurysm Stroke Shingles Hypothyroidism Hypertension Headaches  Allergies: NKDA  Past surgical history:  Hysterectomy Bilateral carpal tunnel release Appendectomy Cataract surgery Cholecystectomy Bilateral knee arthroscopy  Social history: Denies use of nicotine product (smoking, vaping, patches, smokeless) Alcohol use: Denies Denies recreational drug use  Physical Exam:  General: no acute distress, appears stated age Neurologic: alert, answering questions appropriately, following commands Respiratory: unlabored breathing on room air, symmetric chest rise Psychiatric: appropriate affect, normal cadence to speech   MSK (spine):  -Strength exam      Left  Right EHL    4/5  4/5 TA    5/5  5/5 GSC    5/5  5/5 Knee extension  5/5  5/5 Hip flexion   5/5  5/5  -Sensory exam    Sensation intact to light touch in L3-S1 nerve distributions of bilateral lower extremities  -Achilles DTR: 1/4 on the left, 1/4 on the right -Patellar tendon DTR: 1/4 on the left, 1/4 on the right  -Straight leg raise: negative -Contralateral straight leg raise: negative -Clonus: no beats bilaterally  -Left hip exam: No pain to range of motion, negative Stinchfield -Right hip exam: No pain through range of motion, negative Stinchfield  -Midline TTP over the thoracic spine, no midline TTP over the cervical or lumbar spine  Imaging: XR of the lumbar spine from 08/05/2022 was independently reviewed and interpreted, showing no fracture. Spondylolisthesis at L4/5 - shifts about 2.5cm between flexion and extension.  Disc height loss at L4-5 and L5-S1.  XR of the thoracic spine from 08/05/2022 was independently reviewed and interpreted, showing had appearance of wedge  deformity at T9 but difficult to evaluate on the XR as it is obscured by overlying structures. No dislocation.    Patient name: Sara Frye Patient MRN: [id number] Date of visit: 08/05/22

## 2022-08-05 NOTE — Addendum Note (Signed)
Addended by: Ileene Rubens on: 08/05/2022 03:33 PM   Modules accepted: Orders

## 2022-08-06 DIAGNOSIS — I10 Essential (primary) hypertension: Secondary | ICD-10-CM | POA: Diagnosis not present

## 2022-08-06 DIAGNOSIS — I69320 Aphasia following cerebral infarction: Secondary | ICD-10-CM | POA: Diagnosis not present

## 2022-08-06 DIAGNOSIS — M549 Dorsalgia, unspecified: Secondary | ICD-10-CM | POA: Diagnosis not present

## 2022-08-06 DIAGNOSIS — I480 Paroxysmal atrial fibrillation: Secondary | ICD-10-CM | POA: Diagnosis not present

## 2022-08-06 DIAGNOSIS — I69354 Hemiplegia and hemiparesis following cerebral infarction affecting left non-dominant side: Secondary | ICD-10-CM | POA: Diagnosis not present

## 2022-08-06 DIAGNOSIS — R109 Unspecified abdominal pain: Secondary | ICD-10-CM | POA: Diagnosis not present

## 2022-08-14 ENCOUNTER — Other Ambulatory Visit: Payer: Self-pay | Admitting: Internal Medicine

## 2022-08-14 ENCOUNTER — Other Ambulatory Visit: Payer: Self-pay | Admitting: Cardiology

## 2022-08-19 DIAGNOSIS — Z8673 Personal history of transient ischemic attack (TIA), and cerebral infarction without residual deficits: Secondary | ICD-10-CM | POA: Diagnosis not present

## 2022-08-19 DIAGNOSIS — I480 Paroxysmal atrial fibrillation: Secondary | ICD-10-CM | POA: Diagnosis not present

## 2022-08-19 DIAGNOSIS — M549 Dorsalgia, unspecified: Secondary | ICD-10-CM | POA: Diagnosis not present

## 2022-08-19 DIAGNOSIS — I69354 Hemiplegia and hemiparesis following cerebral infarction affecting left non-dominant side: Secondary | ICD-10-CM | POA: Diagnosis not present

## 2022-08-19 DIAGNOSIS — I4891 Unspecified atrial fibrillation: Secondary | ICD-10-CM | POA: Diagnosis not present

## 2022-08-19 DIAGNOSIS — Z09 Encounter for follow-up examination after completed treatment for conditions other than malignant neoplasm: Secondary | ICD-10-CM | POA: Diagnosis not present

## 2022-08-19 DIAGNOSIS — I69320 Aphasia following cerebral infarction: Secondary | ICD-10-CM | POA: Diagnosis not present

## 2022-08-19 DIAGNOSIS — I4819 Other persistent atrial fibrillation: Secondary | ICD-10-CM | POA: Diagnosis not present

## 2022-08-19 DIAGNOSIS — R109 Unspecified abdominal pain: Secondary | ICD-10-CM | POA: Diagnosis not present

## 2022-08-19 DIAGNOSIS — Z7901 Long term (current) use of anticoagulants: Secondary | ICD-10-CM | POA: Diagnosis not present

## 2022-08-19 DIAGNOSIS — I10 Essential (primary) hypertension: Secondary | ICD-10-CM | POA: Diagnosis not present

## 2022-08-19 DIAGNOSIS — I69351 Hemiplegia and hemiparesis following cerebral infarction affecting right dominant side: Secondary | ICD-10-CM | POA: Diagnosis not present

## 2022-08-25 DIAGNOSIS — M549 Dorsalgia, unspecified: Secondary | ICD-10-CM | POA: Diagnosis not present

## 2022-08-25 DIAGNOSIS — I69320 Aphasia following cerebral infarction: Secondary | ICD-10-CM | POA: Diagnosis not present

## 2022-08-25 DIAGNOSIS — I10 Essential (primary) hypertension: Secondary | ICD-10-CM | POA: Diagnosis not present

## 2022-08-25 DIAGNOSIS — I69354 Hemiplegia and hemiparesis following cerebral infarction affecting left non-dominant side: Secondary | ICD-10-CM | POA: Diagnosis not present

## 2022-08-25 DIAGNOSIS — I480 Paroxysmal atrial fibrillation: Secondary | ICD-10-CM | POA: Diagnosis not present

## 2022-08-25 DIAGNOSIS — R109 Unspecified abdominal pain: Secondary | ICD-10-CM | POA: Diagnosis not present

## 2022-08-28 DIAGNOSIS — I480 Paroxysmal atrial fibrillation: Secondary | ICD-10-CM | POA: Diagnosis not present

## 2022-08-28 DIAGNOSIS — E7800 Pure hypercholesterolemia, unspecified: Secondary | ICD-10-CM | POA: Diagnosis not present

## 2022-08-28 DIAGNOSIS — Z7901 Long term (current) use of anticoagulants: Secondary | ICD-10-CM | POA: Diagnosis not present

## 2022-08-28 DIAGNOSIS — Z8679 Personal history of other diseases of the circulatory system: Secondary | ICD-10-CM | POA: Diagnosis not present

## 2022-08-28 DIAGNOSIS — M549 Dorsalgia, unspecified: Secondary | ICD-10-CM | POA: Diagnosis not present

## 2022-08-28 DIAGNOSIS — E039 Hypothyroidism, unspecified: Secondary | ICD-10-CM | POA: Diagnosis not present

## 2022-08-28 DIAGNOSIS — G8929 Other chronic pain: Secondary | ICD-10-CM | POA: Diagnosis not present

## 2022-08-28 DIAGNOSIS — I69354 Hemiplegia and hemiparesis following cerebral infarction affecting left non-dominant side: Secondary | ICD-10-CM | POA: Diagnosis not present

## 2022-08-28 DIAGNOSIS — R109 Unspecified abdominal pain: Secondary | ICD-10-CM | POA: Diagnosis not present

## 2022-08-28 DIAGNOSIS — I69320 Aphasia following cerebral infarction: Secondary | ICD-10-CM | POA: Diagnosis not present

## 2022-08-28 DIAGNOSIS — Z7982 Long term (current) use of aspirin: Secondary | ICD-10-CM | POA: Diagnosis not present

## 2022-08-28 DIAGNOSIS — Z9181 History of falling: Secondary | ICD-10-CM | POA: Diagnosis not present

## 2022-08-28 DIAGNOSIS — I10 Essential (primary) hypertension: Secondary | ICD-10-CM | POA: Diagnosis not present

## 2022-08-28 DIAGNOSIS — R42 Dizziness and giddiness: Secondary | ICD-10-CM | POA: Diagnosis not present

## 2022-09-02 DIAGNOSIS — I10 Essential (primary) hypertension: Secondary | ICD-10-CM | POA: Diagnosis not present

## 2022-09-02 DIAGNOSIS — I69354 Hemiplegia and hemiparesis following cerebral infarction affecting left non-dominant side: Secondary | ICD-10-CM | POA: Diagnosis not present

## 2022-09-02 DIAGNOSIS — I480 Paroxysmal atrial fibrillation: Secondary | ICD-10-CM | POA: Diagnosis not present

## 2022-09-02 DIAGNOSIS — I69320 Aphasia following cerebral infarction: Secondary | ICD-10-CM | POA: Diagnosis not present

## 2022-09-02 DIAGNOSIS — R109 Unspecified abdominal pain: Secondary | ICD-10-CM | POA: Diagnosis not present

## 2022-09-02 DIAGNOSIS — M549 Dorsalgia, unspecified: Secondary | ICD-10-CM | POA: Diagnosis not present

## 2022-09-02 NOTE — Progress Notes (Unsigned)
Patient: Sara Frye Date of Birth: 1928-12-13  Reason for Visit: Follow up CVA 07/20/22 History from: Patient, daughter Lorre Nick Primary Neurologist: Dr. Leonie Man (Saw  Dr. Erlinda Hong)  ASSESSMENT AND PLAN 89 y.o. year old female   21.  Stroke-right cerebellum near peduncle infarct -Etiology likely large vessel disease with right V4 occlusion -On Eliquis daily given A-fib history, continue addition of aspirin 81 mg daily given large vessel disease  -Continues to have some mild speech deficits, I will order home health speech therapy while she continues PT  2.  Atrial fibrillation -On Eliquis  3.  Hypertension -BP goal less than 130/90 -On lisinopril, ensure not dropping too low  4.  Hyperlipidemia -LDL 84, goal less than 70 -Rosuvastatin increased from 2.5 daily to 5 mg daily -High intensity statin not indicated due to advanced age, LDL close to goal  I will see her back in 6 months.  She will continue close follow-up with her PCP for management of vascular risk factors.  She will need a lipid recheck in the next month or so.  For any acute stroke symptoms she should go to the ER.  HISTORY OF PRESENT ILLNESS: Today 09/03/22 Sara Frye here today for stroke clinic follow-up.  Presented to the ER 07/20/2022 with difficulty talking, headache, dizziness.  MRI of the brain showed right cerebellum infarct.  CTA head and neck right V4 occlusion post PICA.  Stroke etiology likely due to right V4 occlusion.  Was on Eliquis for A-fib at the time.  Aspirin 81 mg daily was added for stroke prevention in the setting of large vessel disease.  Lives with her significant other. Has an aide 3 days a week since the stroke. Needs helps with bathing, housework, cooking. Is prone to diarrhea. Continue to have trouble with speech, words come out slurred, tongue gets stuck when talking fast. Has home health PT. OT cleared her. Walking is improving, feels unsteady with cane, much better with rolling  walker. Had gotten a walker 6 months ago anyway. Chronic back pain, sees orthopedics. Recommended Voltaran cream instead of tramadol. Has dizziness when lying flat since stroke back 2017. Other than speech feels back to baseline.   -Code stroke CT head no acute abnormality -CTA head and neck occlusion of right V4 segment status post coil embolization of right ACA aneurysm -MRI of the brain small right cerebellar acute infarct -2D echo EF 65 to 70% -LDL 84 -A1c 5.7 -On Eliquis daily prior to arrival  HISTORY  07/20/22 Dr. Curly Sh[place] HPI: Sara Frye is a 89 y.o. female with a past medical history of afib on eliquis (last dose this morning), hypertension hyperlipidemia, CVA, cerebral aneurysm s/p coiling who is presenting with difficulty talking, headache, and dizziness. He daughter was with her around 1000 when she took her father to a doctors appointment. When she got back her mom was sitting on the couch saying "I can't talk, I can't talk" and reporting a headache. She did take a tramadol which helped the headache, but did not change her speech. Her daughter states that he speech sounds slurred. The patient states that she is having trouble finding the correct words. She denies recent falls, numbness, or tingling. She does not notice any focal neurological deficits.   LKW: 1030 tpa given?: no, on Eliquis Premorbid modified Rankin scale (mRS): 1  REVIEW OF SYSTEMS: Out of a complete 14 system review of symptoms, the patient complains only of the following symptoms, and all other reviewed systems are  negative.  See HPI  ALLERGIES: Allergies  Allergen Reactions   Shrimp (Diagnostic) Itching    HOME MEDICATIONS: Outpatient Medications Prior to Visit  Medication Sig Dispense Refill   acetaminophen (TYLENOL) 500 MG tablet Take 500 mg by mouth daily as needed for mild pain.     aspirin EC 81 MG tablet Take 1 tablet (81 mg total) by mouth daily. Swallow whole. 30 tablet 12   Biotin 5000  MCG TABS Take 1 tablet by mouth daily.     cholecalciferol (VITAMIN D) 1000 units tablet Take 1,000 Units by mouth daily.     ELIQUIS 5 MG TABS tablet NEW PRESCRIPTION REQUEST: Eliquis 5 Mg TAKE ONE TABLET BY MOUTH TWICE DAILY 180 tablet 3   furosemide (LASIX) 20 MG tablet TAKE 1 TABLET BY MOUTH DAILY AS NEEDED FOR LEG SWELLING (Patient taking differently: Take 20 mg by mouth daily.) 90 tablet 1   gabapentin (NEURONTIN) 100 MG capsule Take 100-200 mg by mouth See admin instructions. 100 mg in the morning, 200 mg at bedtime.     levothyroxine (SYNTHROID, LEVOTHROID) 25 MCG tablet Take 25 mcg by mouth daily.  0   lisinopril (ZESTRIL) 10 MG tablet NEW PRESCRIPTION REQUEST: Lisinopril 10 Mg TAKE ONE TABLET BY MOUTH DAILY 90 tablet 3   meclizine (ANTIVERT) 25 MG tablet Take 1 tablet (25 mg total) by mouth 3 (three) times daily as needed for dizziness. 30 tablet 0   OVER THE COUNTER MEDICATION Take 1 each by mouth daily. Beet root gummy     rosuvastatin (CRESTOR) 5 MG tablet Take 0.5 tablets (2.5 mg total) by mouth at bedtime. 90 tablet 1   sertraline (ZOLOFT) 50 MG tablet Take 50 mg by mouth daily.     tiZANidine (ZANAFLEX) 4 MG tablet Take 1 tablet (4 mg total) by mouth every 8 (eight) hours as needed (back pain, muscle spasms). 30 tablet 0   Vibegron (GEMTESA) 75 MG TABS Take 75 mg by mouth daily.     Facility-Administered Medications Prior to Visit  Medication Dose Route Frequency Provider Last Rate Last Admin   ceFAZolin (ANCEF) 2 g in dextrose 5 % 100 mL IVPB  2 g Intravenous Once Joaquim Nam, PA-C        PAST MEDICAL HISTORY: Past Medical History:  Diagnosis Date   Arthritis    spine   Atrial fibrillation ([place])    Brain aneurysm 01/07/2017   Dysrhythmia    afib   H/O cerebral aneurysm repair ACA S/P Coiling by Dr. Estanislado Pandy 01/07/17 01/07/2017   Headache    with Eliquis   Hypertension    Hypothyroidism    Maternal blood transfusion    Neuromuscular disorder ([place])    spinal  injury- "I'm unsure of walking, I'm very careful"   Shingles    Stroke ([place]) 11/2015    PAST SURGICAL HISTORY: Past Surgical History:  Procedure Laterality Date   ABDOMINAL HYSTERECTOMY     ovaries removed   APPENDECTOMY     CARPAL TUNNEL RELEASE Bilateral    CATARACT EXTRACTION     CHOLECYSTECTOMY     IR ANGIO INTRA EXTRACRAN SEL COM CAROTID INNOMINATE BILAT MOD SED  12/30/2016   IR ANGIO INTRA EXTRACRAN SEL COM CAROTID INNOMINATE BILAT MOD SED  03/08/2018   IR ANGIO INTRA EXTRACRAN SEL INTERNAL CAROTID UNI R MOD SED  01/07/2017   IR ANGIO VERTEBRAL SEL SUBCLAVIAN INNOMINATE UNI R MOD SED  12/30/2016   IR ANGIO VERTEBRAL SEL VERTEBRAL BILAT MOD SED  03/08/2018  IR ANGIO VERTEBRAL SEL VERTEBRAL UNI L MOD SED  12/30/2016   IR ANGIOGRAM FOLLOW UP STUDY  01/07/2017   IR ANGIOGRAM FOLLOW UP STUDY  01/07/2017   IR ANGIOGRAM FOLLOW UP STUDY  01/07/2017   IR NEURO EACH ADD'L AFTER BASIC UNI RIGHT (MS)  01/07/2017   IR RADIOLOGIST EVAL & MGMT  11/24/2016   IR RADIOLOGIST EVAL & MGMT  01/25/2017   IR TRANSCATH/EMBOLIZ  01/07/2017   JOINT REPLACEMENT Bilateral    arthroscopic - knees   RADIOLOGY WITH ANESTHESIA N/A 12/30/2016   Procedure: RADIOLOGY WITH ANESTHESIA  EMBOLIZATION;  Surgeon: Luanne Bras, MD;  Location: [place];  Service: Radiology;  Laterality: N/A;   RADIOLOGY WITH ANESTHESIA N/A 01/07/2017   Procedure: EMBOLIZATION;  Surgeon: Luanne Bras, MD;  Location: [place];  Service: Radiology;  Laterality: N/A;    FAMILY HISTORY: Family History  Problem Relation Age of Onset   Cancer Mother    Breast cancer Sister    Heart failure Brother        deceased age 56   Cancer Brother    Cancer Brother    Cancer Sister     SOCIAL HISTORY: Social History   Socioeconomic History   Marital status: Married    Spouse name: Not on file   Number of children: 2   Years of education: Not on file   Highest education level: Not on file  Occupational History   Not on file   Tobacco Use   Smoking status: Never   Smokeless tobacco: Never  Vaping Use   Vaping Use: Never used  Substance and Sexual Activity   Alcohol use: No   Drug use: No   Sexual activity: Not Currently  Other Topics Concern   Not on file  Social History Narrative   Lives with sig other   Social Determinants of Health   Financial Resource Strain: Not on file  Food Insecurity: No Food Insecurity (08/03/2022)   Hunger Vital Sign    Worried About Running Out of Food in the Last Year: Never true    Ran Out of Food in the Last Year: Never true  Transportation Needs: No Transportation Needs (08/03/2022)   PRAPARE - Hydrologist (Medical): No    Lack of Transportation (Non-Medical): No  Physical Activity: Inactive (08/03/2022)   Exercise Vital Sign    Days of Exercise per Week: 0 days    Minutes of Exercise per Session: 0 min  Stress: Not on file  Social Connections: Not on file  Intimate Partner Violence: Not on file   PHYSICAL EXAM  Vitals:   09/03/22 0951  BP: (!) 123/54  Pulse: 68  Weight: 166 lb (75.3 kg)  Height: 5' (1.524 m)   Body mass index is 32.42 kg/m.  Generalized: Well developed, in no acute distress  Neurological examination  Mentation: Alert oriented to time, place, history is provided mostly by her daughter, but patient is very engaged in visit. Follows all commands.  Speech is slightly slowed, mildly dysarthric when prolonged speaking, no aphasia. Cranial nerve II-XII: Pupils were equal round reactive to light. Extraocular movements were full, visual field were full on confrontational test. Facial sensation and strength were normal.  Head turning and shoulder shrug were normal and symmetric. Motor: Overall good strength, no muscle weakness was noted. Sensory: Sensory testing is intact to soft touch on all 4 extremities. No evidence of extinction is noted.  Coordination: Cerebellar testing reveals good finger-nose-finger and  heel-to-shin  bilaterally.  Mild apraxia.  Mild tremor with finger-nose-finger bilaterally slightly more on the left than the right. Gait and station: Slow to rise from seated position, has to push off.  Gait is wide-based, cautious, uses rolling walker in the hallway. Is able to use stool to step up on table. Reflexes: Deep tendon reflexes are symmetric but decreased  DIAGNOSTIC DATA (LABS, IMAGING, TESTING) - I reviewed patient records, labs, notes, testing and imaging myself where available.  Lab Results  Component Value Date   WBC 5.2 07/22/2022   HGB 12.6 07/22/2022   HCT 38.6 07/22/2022   MCV 86.5 07/22/2022   PLT 256 07/22/2022      Component Value Date/Time   NA 134 (L) 07/22/2022 0707   NA 144 10/09/2019 0812   K 3.8 07/22/2022 0707   CL 102 07/22/2022 0707   CO2 23 07/22/2022 0707   GLUCOSE 98 07/22/2022 0707   BUN 6 (L) 07/22/2022 0707   BUN 18 10/09/2019 0812   CREATININE 0.75 07/22/2022 0707   CALCIUM 8.3 (L) 07/22/2022 0707   PROT 6.8 07/20/2022 1429   PROT 7.2 10/09/2019 0812   ALBUMIN 3.8 07/20/2022 1429   ALBUMIN 4.5 10/09/2019 0812   AST 28 07/20/2022 1429   ALT 18 07/20/2022 1429   ALKPHOS 47 07/20/2022 1429   BILITOT 0.4 07/20/2022 1429   BILITOT 0.3 10/09/2019 0812   GFRNONAA >60 07/22/2022 0707   GFRAA 72 10/09/2019 0812   Lab Results  Component Value Date   CHOL 156 07/20/2022   HDL 41 07/20/2022   LDLCALC 84 07/20/2022   TRIG 153 (H) 07/20/2022   CHOLHDL 3.8 07/20/2022   Lab Results  Component Value Date   HGBA1C 5.7 (H) 07/20/2022   No results found for: "VITAMINB12" Lab Results  Component Value Date   TSH 1.715 07/20/2022    Butler Denmark, AGNP-C, DNP 09/03/2022, 10:48 AM [facility] [address] [place], [place] [address] [phone]

## 2022-09-03 ENCOUNTER — Encounter: Payer: Self-pay | Admitting: Neurology

## 2022-09-03 ENCOUNTER — Ambulatory Visit (INDEPENDENT_AMBULATORY_CARE_PROVIDER_SITE_OTHER): Payer: Medicare Other | Admitting: Neurology

## 2022-09-03 ENCOUNTER — Telehealth: Payer: Self-pay | Admitting: Neurology

## 2022-09-03 VITALS — BP 123/54 | HR 68 | Ht 60.0 in | Wt 166.0 lb

## 2022-09-03 DIAGNOSIS — R471 Dysarthria and anarthria: Secondary | ICD-10-CM

## 2022-09-03 DIAGNOSIS — I480 Paroxysmal atrial fibrillation: Secondary | ICD-10-CM | POA: Diagnosis not present

## 2022-09-03 DIAGNOSIS — E7800 Pure hypercholesterolemia, unspecified: Secondary | ICD-10-CM | POA: Diagnosis not present

## 2022-09-03 DIAGNOSIS — I63211 Cerebral infarction due to unspecified occlusion or stenosis of right vertebral arteries: Secondary | ICD-10-CM | POA: Diagnosis not present

## 2022-09-03 NOTE — Patient Instructions (Addendum)
I will order speech therapy, continue aspirin 81 mg along with Eliquis Goal BP < 130/90, LDL < 70, A1C < 7.0 Continue close follow up with PCP, will need recheck of lipid panel at next visit See you back in 6 months

## 2022-09-03 NOTE — Progress Notes (Signed)
I agree with the above plan 

## 2022-09-03 NOTE — Telephone Encounter (Signed)
Home health referral sent to [facility] she is already established with them.

## 2022-09-08 ENCOUNTER — Ambulatory Visit
Admission: RE | Admit: 2022-09-08 | Discharge: 2022-09-08 | Disposition: A | Discharge status: Home / Self Care | Payer: Medicare Other | Source: Ambulatory Visit | Attending: Orthopedic Surgery | Admitting: Orthopedic Surgery

## 2022-09-08 DIAGNOSIS — M546 Pain in thoracic spine: Secondary | ICD-10-CM

## 2022-09-08 DIAGNOSIS — M47814 Spondylosis without myelopathy or radiculopathy, thoracic region: Secondary | ICD-10-CM | POA: Diagnosis not present

## 2022-09-09 DIAGNOSIS — R109 Unspecified abdominal pain: Secondary | ICD-10-CM | POA: Diagnosis not present

## 2022-09-09 DIAGNOSIS — M549 Dorsalgia, unspecified: Secondary | ICD-10-CM | POA: Diagnosis not present

## 2022-09-09 DIAGNOSIS — I10 Essential (primary) hypertension: Secondary | ICD-10-CM | POA: Diagnosis not present

## 2022-09-09 DIAGNOSIS — I480 Paroxysmal atrial fibrillation: Secondary | ICD-10-CM | POA: Diagnosis not present

## 2022-09-09 DIAGNOSIS — I69354 Hemiplegia and hemiparesis following cerebral infarction affecting left non-dominant side: Secondary | ICD-10-CM | POA: Diagnosis not present

## 2022-09-09 DIAGNOSIS — I69320 Aphasia following cerebral infarction: Secondary | ICD-10-CM | POA: Diagnosis not present

## 2022-09-10 ENCOUNTER — Other Ambulatory Visit: Payer: Self-pay | Admitting: Orthopedic Surgery

## 2022-09-15 DIAGNOSIS — I10 Essential (primary) hypertension: Secondary | ICD-10-CM | POA: Diagnosis not present

## 2022-09-15 DIAGNOSIS — I480 Paroxysmal atrial fibrillation: Secondary | ICD-10-CM | POA: Diagnosis not present

## 2022-09-15 DIAGNOSIS — R109 Unspecified abdominal pain: Secondary | ICD-10-CM | POA: Diagnosis not present

## 2022-09-15 DIAGNOSIS — I69354 Hemiplegia and hemiparesis following cerebral infarction affecting left non-dominant side: Secondary | ICD-10-CM | POA: Diagnosis not present

## 2022-09-15 DIAGNOSIS — M549 Dorsalgia, unspecified: Secondary | ICD-10-CM | POA: Diagnosis not present

## 2022-09-15 DIAGNOSIS — I69320 Aphasia following cerebral infarction: Secondary | ICD-10-CM | POA: Diagnosis not present

## 2022-09-16 DIAGNOSIS — I69320 Aphasia following cerebral infarction: Secondary | ICD-10-CM | POA: Diagnosis not present

## 2022-09-16 DIAGNOSIS — I10 Essential (primary) hypertension: Secondary | ICD-10-CM | POA: Diagnosis not present

## 2022-09-16 DIAGNOSIS — I480 Paroxysmal atrial fibrillation: Secondary | ICD-10-CM | POA: Diagnosis not present

## 2022-09-16 DIAGNOSIS — I69354 Hemiplegia and hemiparesis following cerebral infarction affecting left non-dominant side: Secondary | ICD-10-CM | POA: Diagnosis not present

## 2022-09-17 DIAGNOSIS — I10 Essential (primary) hypertension: Secondary | ICD-10-CM | POA: Diagnosis not present

## 2022-09-17 DIAGNOSIS — M549 Dorsalgia, unspecified: Secondary | ICD-10-CM | POA: Diagnosis not present

## 2022-09-17 DIAGNOSIS — I480 Paroxysmal atrial fibrillation: Secondary | ICD-10-CM | POA: Diagnosis not present

## 2022-09-17 DIAGNOSIS — I69354 Hemiplegia and hemiparesis following cerebral infarction affecting left non-dominant side: Secondary | ICD-10-CM | POA: Diagnosis not present

## 2022-09-17 DIAGNOSIS — I69320 Aphasia following cerebral infarction: Secondary | ICD-10-CM | POA: Diagnosis not present

## 2022-09-17 DIAGNOSIS — R109 Unspecified abdominal pain: Secondary | ICD-10-CM | POA: Diagnosis not present

## 2022-09-22 DIAGNOSIS — M549 Dorsalgia, unspecified: Secondary | ICD-10-CM | POA: Diagnosis not present

## 2022-09-22 DIAGNOSIS — I69354 Hemiplegia and hemiparesis following cerebral infarction affecting left non-dominant side: Secondary | ICD-10-CM | POA: Diagnosis not present

## 2022-09-22 DIAGNOSIS — I480 Paroxysmal atrial fibrillation: Secondary | ICD-10-CM | POA: Diagnosis not present

## 2022-09-22 DIAGNOSIS — I10 Essential (primary) hypertension: Secondary | ICD-10-CM | POA: Diagnosis not present

## 2022-09-22 DIAGNOSIS — I69320 Aphasia following cerebral infarction: Secondary | ICD-10-CM | POA: Diagnosis not present

## 2022-09-22 DIAGNOSIS — R109 Unspecified abdominal pain: Secondary | ICD-10-CM | POA: Diagnosis not present

## 2022-09-24 DIAGNOSIS — I10 Essential (primary) hypertension: Secondary | ICD-10-CM | POA: Diagnosis not present

## 2022-10-06 DIAGNOSIS — Z7901 Long term (current) use of anticoagulants: Secondary | ICD-10-CM | POA: Diagnosis not present

## 2022-10-06 DIAGNOSIS — R0781 Pleurodynia: Secondary | ICD-10-CM | POA: Diagnosis not present

## 2022-10-06 DIAGNOSIS — L304 Erythema intertrigo: Secondary | ICD-10-CM | POA: Diagnosis not present

## 2022-10-06 DIAGNOSIS — R42 Dizziness and giddiness: Secondary | ICD-10-CM | POA: Diagnosis not present

## 2022-10-06 DIAGNOSIS — B0229 Other postherpetic nervous system involvement: Secondary | ICD-10-CM | POA: Diagnosis not present

## 2022-10-06 DIAGNOSIS — R195 Other fecal abnormalities: Secondary | ICD-10-CM | POA: Diagnosis not present

## 2022-10-29 DIAGNOSIS — R1012 Left upper quadrant pain: Secondary | ICD-10-CM | POA: Diagnosis not present

## 2022-10-29 DIAGNOSIS — R5381 Other malaise: Secondary | ICD-10-CM | POA: Diagnosis not present

## 2022-11-03 ENCOUNTER — Ambulatory Visit: Payer: Medicare Other | Admitting: Internal Medicine

## 2023-01-12 ENCOUNTER — Ambulatory Visit: Payer: Medicare Other | Admitting: Internal Medicine

## 2023-01-12 ENCOUNTER — Encounter: Payer: Self-pay | Admitting: Internal Medicine

## 2023-01-12 VITALS — BP 146/72 | HR 82 | Ht 60.0 in | Wt 167.0 lb

## 2023-01-12 DIAGNOSIS — I10 Essential (primary) hypertension: Secondary | ICD-10-CM

## 2023-01-12 DIAGNOSIS — I480 Paroxysmal atrial fibrillation: Secondary | ICD-10-CM

## 2023-01-12 DIAGNOSIS — R001 Bradycardia, unspecified: Secondary | ICD-10-CM | POA: Diagnosis not present

## 2023-01-12 NOTE — Progress Notes (Unsigned)
Primary Physician/Referring:  Merri Brunette, MD  Patient ID: Sara Frye, female    DOB: 1928-11-20, 89 y.o.   MRN: [id number]  Chief Complaint  Patient presents with   Bradycardia   Essential hypertension   Paroxysmal atrial fibrillation   Follow-up     HPI: Sara Frye  is a 89 y.o. female  with hypertension, hyperlipidemia,  paroxysmal Afib, bilateral MCA and ACA multifocal punctate infarcts, after which she was started on anticaogulation on 12/18/15. She was also found to have cerebral aneurysm for which she underwent endovascular obliteration of anterior communicating artery aneurysm with stent assisted coiling by Dr. Corliss Skains on 01/07/17. She continues to follow closely with Dr.Deveshwar for management of cerebral aneurysm with stenting.  Patient presents for acute visit due to heart rate dropping low into the 40s. She called EMS this morning but did not want to go to the hospital since she had an appointment here. Her pulse rose to 75 bpm on its own and her BP was stable the entire time. She is also getting dizzy and light-headed which is most likely from the beta-blockers. Patient is agreeable to stopping beta-blocker and taking a different medicine to control her BP. She denies chest pain, shortness of breath, palpitations, diaphoresis, syncope, claudication, edema.     Past Medical History:  Diagnosis Date   Arthritis    spine   Atrial fibrillation (HCC)    Brain aneurysm 01/07/2017   Dysrhythmia    afib   H/O cerebral aneurysm repair ACA S/P Coiling by Dr. Corliss Skains 01/07/17 01/07/2017   Headache    with Eliquis   Hypertension    Hypothyroidism    Maternal blood transfusion    Neuromuscular disorder (HCC)    spinal injury- "I'm unsure of walking, I'm very careful"   Shingles    Stroke (HCC) 11/2015   Family History  Problem Relation Age of Onset   Cancer Mother    Breast cancer Sister    Heart failure Brother        deceased age 77   Cancer  Brother    Cancer Brother    Cancer Sister    Past Surgical History:  Procedure Laterality Date   ABDOMINAL HYSTERECTOMY     ovaries removed   APPENDECTOMY     CARPAL TUNNEL RELEASE Bilateral    CATARACT EXTRACTION     CHOLECYSTECTOMY     IR ANGIO INTRA EXTRACRAN SEL COM CAROTID INNOMINATE BILAT MOD SED  12/30/2016   IR ANGIO INTRA EXTRACRAN SEL COM CAROTID INNOMINATE BILAT MOD SED  03/08/2018   IR ANGIO INTRA EXTRACRAN SEL INTERNAL CAROTID UNI R MOD SED  01/07/2017   IR ANGIO VERTEBRAL SEL SUBCLAVIAN INNOMINATE UNI R MOD SED  12/30/2016   IR ANGIO VERTEBRAL SEL VERTEBRAL BILAT MOD SED  03/08/2018   IR ANGIO VERTEBRAL SEL VERTEBRAL UNI L MOD SED  12/30/2016   IR ANGIOGRAM FOLLOW UP STUDY  01/07/2017   IR ANGIOGRAM FOLLOW UP STUDY  01/07/2017   IR ANGIOGRAM FOLLOW UP STUDY  01/07/2017   IR NEURO EACH ADD'L AFTER BASIC UNI RIGHT (MS)  01/07/2017   IR RADIOLOGIST EVAL & MGMT  11/24/2016   IR RADIOLOGIST EVAL & MGMT  01/25/2017   IR TRANSCATH/EMBOLIZ  01/07/2017   JOINT REPLACEMENT Bilateral    arthroscopic - knees   RADIOLOGY WITH ANESTHESIA N/A 12/30/2016   Procedure: RADIOLOGY WITH ANESTHESIA  EMBOLIZATION;  Surgeon: Julieanne Cotton, MD;  Location: [facility] OR;  Service: Radiology;  Laterality: N/A;  RADIOLOGY WITH ANESTHESIA N/A 01/07/2017   Procedure: EMBOLIZATION;  Surgeon: Julieanne Cotton, MD;  Location: [facility] OR;  Service: Radiology;  Laterality: N/A;   Social History   Tobacco Use   Smoking status: Never   Smokeless tobacco: Never  Substance Use Topics   Alcohol use: No     ROS   Review of Systems  Constitutional: Negative for malaise/fatigue.  Cardiovascular:  Negative for chest pain, claudication, dyspnea on exertion, leg swelling, near-syncope, orthopnea, palpitations, paroxysmal nocturnal dyspnea and syncope.  Neurological:  Positive for dizziness and light-headedness.  All other systems reviewed and are negative.   Objective  There were no vitals taken for  this visit. There is no height or weight on file to calculate BMI.    09/03/2022    9:51 AM 08/05/2022    1:20 PM 07/23/2022   12:20 PM  Vitals with BMI  Height 5\' 0"  5\' 0"    Weight 166 lbs 177 lbs   BMI 32.42 34.57   Systolic 123 119 440  Diastolic 54 76 56  Pulse 68 91 82   Physical Exam Vitals reviewed.  Constitutional:      Appearance: She is obese.     Comments: Moderately obese  HENT:     Head: Normocephalic and atraumatic.  Neck:     Vascular: No JVD.  Cardiovascular:     Rate and Rhythm: Normal rate and regular rhythm.     Pulses:          Carotid pulses are 2+ on the right side and 2+ on the left side.      Femoral pulses are 2+ on the right side and 2+ on the left side.      Popliteal pulses are 1+ on the right side and 1+ on the left side.       Dorsalis pedis pulses are 1+ on the right side and 1+ on the left side.       Posterior tibial pulses are 0 on the right side and 0 on the left side.     Heart sounds: Murmur heard.     Harsh midsystolic murmur is present with a grade of 2/6 at the upper right sternal border.     No gallop.  Pulmonary:     Effort: Pulmonary effort is normal.     Breath sounds: Normal breath sounds.  Abdominal:     Comments: Obese  Musculoskeletal:     Right lower leg: Edema (trace) present.     Left lower leg: Edema (trace) present.  Skin:    General: Skin is warm and dry.    Laboratory examination:      Latest Ref Rng & Units 07/22/2022    7:07 AM 07/21/2022    4:45 AM 07/20/2022    5:06 PM  CMP  Glucose 70 - 99 mg/dL 98  92  91   BUN 8 - 23 mg/dL 6  5  7    Creatinine 0.44 - 1.00 mg/dL 3.47  4.25  9.56   Sodium 135 - 145 mmol/L 134  137  142   Potassium 3.5 - 5.1 mmol/L 3.8  3.5  3.7   Chloride 98 - 111 mmol/L 102  101  102   CO2 22 - 32 mmol/L 23  25    Calcium 8.9 - 10.3 mg/dL 8.3  8.4        Latest Ref Rng & Units 07/22/2022    7:07 AM 07/21/2022    4:45 AM 07/20/2022  5:06 PM  CBC  WBC 4.0 - 10.5 K/uL 5.2   6.8    Hemoglobin 12.0 - 15.0 g/dL 09.8  11.9  14.7   Hematocrit 36.0 - 46.0 % 38.6  35.4  37.0   Platelets 150 - 400 K/uL 256  246     Lipid Panel     Component Value Date/Time   CHOL 156 07/20/2022 1429   CHOL 136 10/09/2019 0812   TRIG 153 (H) 07/20/2022 1429   HDL 41 07/20/2022 1429   HDL 59 10/09/2019 0812   CHOLHDL 3.8 07/20/2022 1429   VLDL 31 07/20/2022 1429   LDLCALC 84 07/20/2022 1429   LDLCALC 62 10/09/2019 0812   HEMOGLOBIN A1C Lab Results  Component Value Date   HGBA1C 5.7 (H) 07/20/2022   MPG 117 07/20/2022   TSH Recent Labs    07/20/22 1429  TSH 1.715     External labs : 01/01/2021: Total cholesterol 144, HDL 56, LDL 60, triglycerides 93  03/25/9561:  HDL 57, LDL 60, total cholesterol 130, triglycerides 114 BUN 18, Cr 0.84 TSH 3.62  07/11/2018 Cholesterol, total 133.000 12/15/2017 HDL 61.000 12/15/2017 LDL 57.000 12/15/2017 Triglycerides 111.000 12/22/2018  A1C N/D Hemoglobin 13.700 G/ 07/11/2018, INR 1.080 03/08/2018 Platelets 280.000 X 07/11/2018  Creatinine, Serum 0.840 MG/ 07/11/2018 Potassium 3.600 03/08/2018 ALT (SGPT) 15.000 IU/ 07/11/2018 TSH 2.470 12/22/2018  Allergies   Allergies  Allergen Reactions   Shrimp (Diagnostic) Itching    Medications Prior to Visit:   Outpatient Medications Prior to Visit  Medication Sig Dispense Refill   acetaminophen (TYLENOL) 500 MG tablet Take 500 mg by mouth daily as needed for mild pain.     aspirin EC 81 MG tablet Take 1 tablet (81 mg total) by mouth daily. Swallow whole. 30 tablet 12   Biotin 5000 MCG TABS Take 1 tablet by mouth daily.     cholecalciferol (VITAMIN D) 1000 units tablet Take 1,000 Units by mouth daily.     ELIQUIS 5 MG TABS tablet NEW PRESCRIPTION REQUEST: Eliquis 5 Mg TAKE ONE TABLET BY MOUTH TWICE DAILY 180 tablet 3   furosemide (LASIX) 20 MG tablet TAKE 1 TABLET BY MOUTH DAILY AS NEEDED FOR LEG SWELLING (Patient taking differently: Take 20 mg by mouth daily.) 90 tablet 1    gabapentin (NEURONTIN) 100 MG capsule Take 100-200 mg by mouth See admin instructions. 100 mg in the morning, 200 mg at bedtime.     levothyroxine (SYNTHROID, LEVOTHROID) 25 MCG tablet Take 25 mcg by mouth daily.  0   lisinopril (ZESTRIL) 10 MG tablet NEW PRESCRIPTION REQUEST: Lisinopril 10 Mg TAKE ONE TABLET BY MOUTH DAILY 90 tablet 3   meclizine (ANTIVERT) 25 MG tablet Take 1 tablet (25 mg total) by mouth 3 (three) times daily as needed for dizziness. 30 tablet 0   OVER THE COUNTER MEDICATION Take 1 each by mouth daily. Beet root gummy     rosuvastatin (CRESTOR) 5 MG tablet Take 0.5 tablets (2.5 mg total) by mouth at bedtime. 90 tablet 1   sertraline (ZOLOFT) 50 MG tablet Take 50 mg by mouth daily.     tiZANidine (ZANAFLEX) 4 MG tablet Take 1 tablet (4 mg total) by mouth every 8 (eight) hours as needed (back pain, muscle spasms). 30 tablet 0   Vibegron (GEMTESA) 75 MG TABS Take 75 mg by mouth daily.     Facility-Administered Medications Prior to Visit  Medication Dose Route Frequency Provider Last Rate Last Admin   ceFAZolin (ANCEF) 2 g in dextrose 5 %  100 mL IVPB  2 g Intravenous Once Lynnette Caffey A, PA-C       Final Medications at End of Visit    No outpatient medications have been marked as taking for the 01/12/23 encounter (Office Visit) with Clotilde Dieter, DO.   Radiology:  No results found.  04/18/2019: 1. No residual A-comm aneurysm sac filling, status post flow diverting stent placement and coiling. 2. Chronic ischemic microangiopathy and generalized volume loss. No acute abnormality.  Cardiac Studies:   PCV ECHOCARDIOGRAM COMPLETE 11/06/2020 Left ventricle cavity is normal in size. Moderate concentric hypertrophy of the left ventricle. Normal global wall motion. Normal LV systolic function with visual EF 50-55%. Unable to evaluate diastolic function due to atrial fibrillation. Left atrial cavity is mildly dilated. Trileaflet aortic valve. Mild annular calcification. No  regurgitation. Mild mitral annular calcification. Mild mitral valve stenosis. Mean PG 2 mmHg, MVA 1.7 cm2 by PHT method at HR 62 bpm.  Trace regurgitation. Mild tricuspid regurgitation.  Estimated pulmonary artery systolic pressure 15 mmHg. No evidence of pulmonary hypertension.   Echo 12/17/2015 - at [facility]- Moderate LVH, EF-60-65 percent.  Grade 1 diastolic dysfunction with elevated LV filling pressure.  Mitral annulus calcification.  Severely dilated LA, trace MR.  Status post endovascular obliteration of anterior communicating artery aneurysm with stent assisted coiling by Dr. Corliss Skains on 01/07/2017.  EKG:  05/05/22: Sinus rhythm with PACs, LVH  10/28/2021: Sinus rhythm with PACs at a rate of 73 bpm.  Left axis, left anterior fascicular block.  LVH with secondary repolarization abnormalities, cannot exclude lateral ischemia.  Compared EKG 04/30/2021, no significant change.  10/23/2020: Sinus rhythm at a rate of 63 bpm.  Left axis, left anterior fascicular block.  ST-T wave abnormality, cannot exclude lateral ischemia.  Compared to EKG 04/19/2020, no significant change in lateral ST-T abnormalities.   04/19/2020: Normal sinus rhythm at rate of 68 bpm, left atrial enlargement, left axis deviation, left anterior fascicular block.  LVH with repolarization abnormality, cannot exclude lateral ischemia.  Single PVC.  No significant change from 10/06/2019.  Assessment     ICD-10-CM   1. Paroxysmal atrial fibrillation (HCC)  I48.0 EKG 12-Lead      No orders of the defined types were placed in this encounter.   There are no discontinued medications.    This patients CHA2DS2-VASc Score 5 (HTN, Vasc, AGE, F) and yearly risk of stroke 7.2%.  Recommendations:   Sara Frye  is a 89 y.o. female  with hypertension, hyperlipidemia,  paroxysmal Afib, bilateral MCA and ACA multifocal punctate infarcts, after which she was started on anticaogulation on 12/18/15. She was also found to have  cerebral aneurysm for which she underwent endovascular obliteration of anterior communicating artery aneurysm with stent assisted coiling by Dr. Corliss Skains on 01/07/17.   Bradycardia Stop Toprol-XL   Essential hypertension Continue current cardiac medications but stop Toprol. Adding lisinopril 10mg  for BP control Encourage low-sodium diet, less than 2000 mg daily.  Paroxysmal atrial fibrillation (HCC) Continue taking Eliquis  Follow-up in 6 months or sooner if needed.     Clotilde Dieter, DO, [facility] 01/12/2023, 10:14 AM Office: [phone]

## 2023-02-11 DIAGNOSIS — E039 Hypothyroidism, unspecified: Secondary | ICD-10-CM | POA: Diagnosis not present

## 2023-02-11 DIAGNOSIS — I10 Essential (primary) hypertension: Secondary | ICD-10-CM | POA: Diagnosis not present

## 2023-02-19 DIAGNOSIS — M810 Age-related osteoporosis without current pathological fracture: Secondary | ICD-10-CM | POA: Diagnosis not present

## 2023-02-19 DIAGNOSIS — Z0000 Encounter for general adult medical examination without abnormal findings: Secondary | ICD-10-CM | POA: Diagnosis not present

## 2023-02-19 DIAGNOSIS — H8110 Benign paroxysmal vertigo, unspecified ear: Secondary | ICD-10-CM | POA: Diagnosis not present

## 2023-02-19 DIAGNOSIS — Z8673 Personal history of transient ischemic attack (TIA), and cerebral infarction without residual deficits: Secondary | ICD-10-CM | POA: Diagnosis not present

## 2023-02-19 DIAGNOSIS — R0989 Other specified symptoms and signs involving the circulatory and respiratory systems: Secondary | ICD-10-CM | POA: Diagnosis not present

## 2023-02-19 DIAGNOSIS — G2581 Restless legs syndrome: Secondary | ICD-10-CM | POA: Diagnosis not present

## 2023-02-19 DIAGNOSIS — E7800 Pure hypercholesterolemia, unspecified: Secondary | ICD-10-CM | POA: Diagnosis not present

## 2023-02-19 DIAGNOSIS — R32 Unspecified urinary incontinence: Secondary | ICD-10-CM | POA: Diagnosis not present

## 2023-02-19 DIAGNOSIS — E039 Hypothyroidism, unspecified: Secondary | ICD-10-CM | POA: Diagnosis not present

## 2023-02-19 DIAGNOSIS — I4891 Unspecified atrial fibrillation: Secondary | ICD-10-CM | POA: Diagnosis not present

## 2023-02-19 DIAGNOSIS — I10 Essential (primary) hypertension: Secondary | ICD-10-CM | POA: Diagnosis not present

## 2023-02-24 ENCOUNTER — Telehealth: Payer: Self-pay | Admitting: Neurology

## 2023-02-24 NOTE — Telephone Encounter (Signed)
I received labs from PCP collected 02/11/2023 CBC unremarkable, CMP unremarkable creatinine 0.88.  Lipid panel LDL 82, TSH 2.020.  Office note indicates continue Eliquis, lisinopril, Lasix, aspirin 81 mg daily, rosuvastatin 5 mg.  Referral to vestibular rehab for vertigo.

## 2023-03-13 ENCOUNTER — Other Ambulatory Visit: Payer: Self-pay | Admitting: Cardiology

## 2023-03-23 ENCOUNTER — Ambulatory Visit (INDEPENDENT_AMBULATORY_CARE_PROVIDER_SITE_OTHER): Payer: Medicare Other | Admitting: Neurology

## 2023-03-23 ENCOUNTER — Encounter: Payer: Self-pay | Admitting: Neurology

## 2023-03-23 VITALS — Ht 60.0 in | Wt 172.5 lb

## 2023-03-23 DIAGNOSIS — I639 Cerebral infarction, unspecified: Secondary | ICD-10-CM | POA: Diagnosis not present

## 2023-03-23 DIAGNOSIS — I480 Paroxysmal atrial fibrillation: Secondary | ICD-10-CM | POA: Diagnosis not present

## 2023-03-23 DIAGNOSIS — I10 Essential (primary) hypertension: Secondary | ICD-10-CM | POA: Diagnosis not present

## 2023-03-23 NOTE — Progress Notes (Signed)
Patient: Sara Frye Date of Birth: Jan 18, 1929  Reason for Visit: Follow up CVA 07/20/22 History from: Patient, daughter Valentina Gu Primary Neurologist: Dr. Pearlean Brownie (Saw  Dr. Roda Shutters)  ASSESSMENT AND PLAN 89 y.o. year old female   1.  Stroke-right cerebellum near peduncle infarct -Etiology likely large vessel disease with right V4 occlusion -On Eliquis daily given A-fib history, continue addition of aspirin 81 mg daily given large vessel disease  -Essentially back to normal, speech has improved, lifelong history of vertigo, going to restart vestibular rehab  2.  Atrial fibrillation -On Eliquis  3.  Hypertension -At goal, BP goal less than 130/90 -On lisinopril  4.  Hyperlipidemia -goal less than 70 -On Crestor 5 mg daily -High intensity statin not indicated due to advanced age, LDL close to goal  Has done quite well.  Continue close follow-up with PCP for management of vascular risk factors.  She will return here on an as-needed basis.  HISTORY OF PRESENT ILLNESS: Today 03/23/23 Here with daughter, Valentina Gu. Lives with significant other. Daughter is close by. Completed therapy, using walker. Completed ST, thinks speech is back to baseline. No falls. Still on Eliquis, aspirin 81 mg daily. Family manages medication. Aide comes 3 days a week, helps with ADLs, house cleaning. May feel dizzy if she gets up too fast. On Crestor 5 mg. PCP collected 02/11/2023 CBC unremarkable, CMP unremarkable creatinine 0.88.  Lipid panel LDL 82, TSH 2.020.  Office note indicates continue Eliquis, lisinopril, Lasix, aspirin 81 mg daily, rosuvastatin 5 mg.  Referral to vestibular rehab for vertigo. Hasn't gone yet, often gets dizzy when sitting. Has history of known vertigo, knows PT who does vestibular rehab. Was nervous today. Takes meclizine for dizziness, it helps.   09/03/22 SS: Sara Frye here today for stroke clinic follow-up.  Presented to the ER 07/20/2022 with difficulty talking, headache,  dizziness.  MRI of the brain showed right cerebellum infarct.  CTA head and neck right V4 occlusion post PICA.  Stroke etiology likely due to right V4 occlusion.  Was on Eliquis for A-fib at the time.  Aspirin 81 mg daily was added for stroke prevention in the setting of large vessel disease.  Lives with her significant other. Has an aide 3 days a week since the stroke. Needs helps with bathing, housework, cooking. Is prone to diarrhea. Continue to have trouble with speech, words come out slurred, tongue gets stuck when talking fast. Has home health PT. OT cleared her. Walking is improving, feels unsteady with cane, much better with rolling walker. Had gotten a walker 6 months ago anyway. Chronic back pain, sees orthopedics. Recommended Voltaran cream instead of tramadol. Has dizziness when lying flat since stroke back 2017. Other than speech feels back to baseline.   -Code stroke CT head no acute abnormality -CTA head and neck occlusion of right V4 segment status post coil embolization of right ACA aneurysm -MRI of the brain small right cerebellar acute infarct -2D echo EF 65 to 70% -LDL 84 -A1c 5.7 -On Eliquis daily prior to arrival  HISTORY  07/20/22 Dr. Iver Nestle HPI: Braxtyn P Mcelvany is a 89 y.o. female with a past medical history of afib on eliquis (last dose this morning), hypertension hyperlipidemia, CVA, cerebral aneurysm s/p coiling who is presenting with difficulty talking, headache, and dizziness. He daughter was with her around 1000 when she took her father to a doctors appointment. When she got back her mom was sitting on the couch saying "I can't talk, I can't talk"  and reporting a headache. She did take a tramadol which helped the headache, but did not change her speech. Her daughter states that he speech sounds slurred. The patient states that she is having trouble finding the correct words. She denies recent falls, numbness, or tingling. She does not notice any focal neurological  deficits.   LKW: 1030 tpa given?: no, on Eliquis Premorbid modified Rankin scale (mRS): 1  REVIEW OF SYSTEMS: Out of a complete 14 system review of symptoms, the patient complains only of the following symptoms, and all other reviewed systems are negative.  See HPI  ALLERGIES: Allergies  Allergen Reactions   Shrimp (Diagnostic) Itching    HOME MEDICATIONS: Outpatient Medications Prior to Visit  Medication Sig Dispense Refill   acetaminophen (TYLENOL) 500 MG tablet Take 500 mg by mouth daily as needed for mild pain.     aspirin EC 81 MG tablet Take 1 tablet (81 mg total) by mouth daily. Swallow whole. 30 tablet 12   Biotin 5000 MCG TABS Take 1 tablet by mouth daily.     cholecalciferol (VITAMIN D) 1000 units tablet Take 1,000 Units by mouth daily.     ELIQUIS 5 MG TABS tablet TAKE 1 TABLET BY MOUTH TWICE DAILY 180 tablet 3   furosemide (LASIX) 20 MG tablet TAKE 1 TABLET BY MOUTH DAILY AS NEEDED FOR LEG SWELLING (Patient taking differently: Take 20 mg by mouth daily.) 90 tablet 1   gabapentin (NEURONTIN) 100 MG capsule Take 100-200 mg by mouth See admin instructions. 100 mg in the morning, 200 mg at bedtime.     levothyroxine (SYNTHROID, LEVOTHROID) 25 MCG tablet Take 25 mcg by mouth daily.  0   lisinopril (ZESTRIL) 10 MG tablet NEW PRESCRIPTION REQUEST: Lisinopril 10 Mg TAKE ONE TABLET BY MOUTH DAILY 90 tablet 3   meclizine (ANTIVERT) 25 MG tablet Take 1 tablet (25 mg total) by mouth 3 (three) times daily as needed for dizziness. 30 tablet 0   OVER THE COUNTER MEDICATION Take 1 each by mouth daily. Beet root gummy     rosuvastatin (CRESTOR) 5 MG tablet Take 0.5 tablets (2.5 mg total) by mouth at bedtime. 90 tablet 1   sertraline (ZOLOFT) 50 MG tablet Take 50 mg by mouth daily.     tiZANidine (ZANAFLEX) 4 MG tablet Take 1 tablet (4 mg total) by mouth every 8 (eight) hours as needed (back pain, muscle spasms). 30 tablet 0   Vibegron (GEMTESA) 75 MG TABS Take 75 mg by mouth daily.      Facility-Administered Medications Prior to Visit  Medication Dose Route Frequency Provider Last Rate Last Admin   ceFAZolin (ANCEF) 2 g in dextrose 5 % 100 mL IVPB  2 g Intravenous Once Villa Herb, PA-C        PAST MEDICAL HISTORY: Past Medical History:  Diagnosis Date   Arthritis    spine   Atrial fibrillation (HCC)    Brain aneurysm 01/07/2017   Dysrhythmia    afib   H/O cerebral aneurysm repair ACA S/P Coiling by Dr. Corliss Skains 01/07/17 01/07/2017   Headache    with Eliquis   Hypertension    Hypothyroidism    Maternal blood transfusion    Neuromuscular disorder (HCC)    spinal injury- "I'm unsure of walking, I'm very careful"   Shingles    Stroke (HCC) 11/2015    PAST SURGICAL HISTORY: Past Surgical History:  Procedure Laterality Date   ABDOMINAL HYSTERECTOMY     ovaries removed   APPENDECTOMY  CARPAL TUNNEL RELEASE Bilateral    CATARACT EXTRACTION     CHOLECYSTECTOMY     IR ANGIO INTRA EXTRACRAN SEL COM CAROTID INNOMINATE BILAT MOD SED  12/30/2016   IR ANGIO INTRA EXTRACRAN SEL COM CAROTID INNOMINATE BILAT MOD SED  03/08/2018   IR ANGIO INTRA EXTRACRAN SEL INTERNAL CAROTID UNI R MOD SED  01/07/2017   IR ANGIO VERTEBRAL SEL SUBCLAVIAN INNOMINATE UNI R MOD SED  12/30/2016   IR ANGIO VERTEBRAL SEL VERTEBRAL BILAT MOD SED  03/08/2018   IR ANGIO VERTEBRAL SEL VERTEBRAL UNI L MOD SED  12/30/2016   IR ANGIOGRAM FOLLOW UP STUDY  01/07/2017   IR ANGIOGRAM FOLLOW UP STUDY  01/07/2017   IR ANGIOGRAM FOLLOW UP STUDY  01/07/2017   IR NEURO EACH ADD'L AFTER BASIC UNI RIGHT (MS)  01/07/2017   IR RADIOLOGIST EVAL & MGMT  11/24/2016   IR RADIOLOGIST EVAL & MGMT  01/25/2017   IR TRANSCATH/EMBOLIZ  01/07/2017   JOINT REPLACEMENT Bilateral    arthroscopic - knees   RADIOLOGY WITH ANESTHESIA N/A 12/30/2016   Procedure: RADIOLOGY WITH ANESTHESIA  EMBOLIZATION;  Surgeon: Julieanne Cotton, MD;  Location: [facility] OR;  Service: Radiology;  Laterality: N/A;   RADIOLOGY WITH  ANESTHESIA N/A 01/07/2017   Procedure: EMBOLIZATION;  Surgeon: Julieanne Cotton, MD;  Location: [facility] OR;  Service: Radiology;  Laterality: N/A;    FAMILY HISTORY: Family History  Problem Relation Age of Onset   Cancer Mother    Breast cancer Sister    Heart failure Brother        deceased age 50   Cancer Brother    Cancer Brother    Cancer Sister     SOCIAL HISTORY: Social History   Socioeconomic History   Marital status: Married    Spouse name: Not on file   Number of children: 2   Years of education: Not on file   Highest education level: Not on file  Occupational History   Not on file  Tobacco Use   Smoking status: Never   Smokeless tobacco: Never  Vaping Use   Vaping status: Never Used  Substance and Sexual Activity   Alcohol use: No   Drug use: No   Sexual activity: Not Currently  Other Topics Concern   Not on file  Social History Narrative   Lives with sig other   Social Determinants of Health   Financial Resource Strain: Not on file  Food Insecurity: No Food Insecurity (08/03/2022)   Hunger Vital Sign    Worried About Running Out of Food in the Last Year: Never true    Ran Out of Food in the Last Year: Never true  Transportation Needs: No Transportation Needs (08/03/2022)   PRAPARE - Administrator, Civil Service (Medical): No    Lack of Transportation (Non-Medical): No  Physical Activity: Inactive (08/03/2022)   Exercise Vital Sign    Days of Exercise per Week: 0 days    Minutes of Exercise per Session: 0 min  Stress: Not on file  Social Connections: Not on file  Intimate Partner Violence: Not on file   PHYSICAL EXAM  Vitals:   03/23/23 1324  Weight: 172 lb 8 oz (78.2 kg)  Height: 5' (1.524 m)   Body mass index is 33.69 kg/m.  Generalized: Well developed, in no acute distress  Neurological examination  Mentation: Alert oriented to time, place, history is provided mostly by her daughter, but patient is very engaged in visit.  Follows all commands.  Speech is much improved, delivered fluently with ease.  No paraphasic errors noted. Cranial nerve II-XII: Pupils were equal round reactive to light. Extraocular movements were full, visual field were full on confrontational test. Facial sensation and strength were normal.  Head turning and shoulder shrug were normal and symmetric. Motor: Overall good strength, no muscle weakness was noted. Sensory: Sensory testing is intact to soft touch on all 4 extremities. No evidence of extinction is noted.  Coordination: Cerebellar testing reveals good finger-nose-finger and heel-to-shin bilaterally.  Mild tremor with finger-nose-finger bilaterally slightly more on the left than the right. Gait and station: Slow to rise from seated position, has to push off.  Gait is wide-based, cautious, uses rolling walker in the hallway  DIAGNOSTIC DATA (LABS, IMAGING, TESTING) - I reviewed patient records, labs, notes, testing and imaging myself where available.  Lab Results  Component Value Date   WBC 5.2 07/22/2022   HGB 12.6 07/22/2022   HCT 38.6 07/22/2022   MCV 86.5 07/22/2022   PLT 256 07/22/2022      Component Value Date/Time   NA 134 (L) 07/22/2022 0707   NA 144 10/09/2019 0812   K 3.8 07/22/2022 0707   CL 102 07/22/2022 0707   CO2 23 07/22/2022 0707   GLUCOSE 98 07/22/2022 0707   BUN 6 (L) 07/22/2022 0707   BUN 18 10/09/2019 0812   CREATININE 0.75 07/22/2022 0707   CALCIUM 8.3 (L) 07/22/2022 0707   PROT 6.8 07/20/2022 1429   PROT 7.2 10/09/2019 0812   ALBUMIN 3.8 07/20/2022 1429   ALBUMIN 4.5 10/09/2019 0812   AST 28 07/20/2022 1429   ALT 18 07/20/2022 1429   ALKPHOS 47 07/20/2022 1429   BILITOT 0.4 07/20/2022 1429   BILITOT 0.3 10/09/2019 0812   GFRNONAA >60 07/22/2022 0707   GFRAA 72 10/09/2019 0812   Lab Results  Component Value Date   CHOL 156 07/20/2022   HDL 41 07/20/2022   LDLCALC 84 07/20/2022   TRIG 153 (H) 07/20/2022   CHOLHDL 3.8 07/20/2022   Lab  Results  Component Value Date   HGBA1C 5.7 (H) 07/20/2022   No results found for: "VITAMINB12" Lab Results  Component Value Date   TSH 1.715 07/20/2022    Margie Ege, AGNP-C, DNP 03/23/2023, 1:58 PM [facility] [address], [address] [place], [place] [address] [phone]

## 2023-03-23 NOTE — Patient Instructions (Addendum)
Continue aspirin 81 mg daily for secondary stroke prevention   Strict management of vascular risk factors with a goal BP less than 130/90, A1c less than 7.0, LDL less than 70 for secondary stroke prevention  Keep close follow up with primary care doctor   See [place] back as needed

## 2023-04-20 DIAGNOSIS — Z780 Asymptomatic menopausal state: Secondary | ICD-10-CM | POA: Diagnosis not present

## 2023-04-20 DIAGNOSIS — M810 Age-related osteoporosis without current pathological fracture: Secondary | ICD-10-CM | POA: Diagnosis not present

## 2023-05-27 DIAGNOSIS — M810 Age-related osteoporosis without current pathological fracture: Secondary | ICD-10-CM | POA: Diagnosis not present

## 2023-06-08 NOTE — Progress Notes (Deleted)
GYNECOLOGY  VISIT   HPI: 89 y.o.   Married  {Race/ethnicity:[id number]}  female   [id number] with No LMP recorded. Patient has had a hysterectomy.   here for   fecal incontinence  GYNECOLOGIC HISTORY: No LMP recorded. Patient has had a hysterectomy. Contraception:  hyst Menopausal hormone therapy:  n/a Last mammogram:  11/28/20 Breast Density Cat B, BI-RADS CAT 1 neg Last pap smear:   ***        OB History     Gravida  5   Para  3   Term  3   Preterm      AB  2   Living  2      SAB  2   IAB      Ectopic      Multiple      Live Births                 Patient Active Problem List   Diagnosis Date Noted   Acute CVA (cerebrovascular accident) (HCC) 07/21/2022   Aphasia 07/20/2022   Hypothyroidism 07/20/2022   Bradycardia 07/14/2022   BPPV (benign paroxysmal positional vertigo) 04/16/2017   History of stroke 03/09/2017   H/O cerebral aneurysm repair ACA S/P Coiling by Dr. Corliss Skains 01/07/17 01/07/2017   Chronic anticoagulation 04/16/2016   Speech disturbance 12/17/2015   Atrial fibrillation (HCC) 12/17/2015   Essential hypertension 12/17/2015   Hypercholesteremia 12/17/2015   Dizziness 12/17/2015   CVA (cerebral vascular accident) [facility])     Past Medical History:  Diagnosis Date   Arthritis    spine   Atrial fibrillation (HCC)    Brain aneurysm 01/07/2017   Dysrhythmia    afib   H/O cerebral aneurysm repair ACA S/P Coiling by Dr. Corliss Skains 01/07/17 01/07/2017   Headache    with Eliquis   Hypertension    Hypothyroidism    Maternal blood transfusion    Neuromuscular disorder (HCC)    spinal injury- "I'm unsure of walking, I'm very careful"   Shingles    Stroke (HCC) 11/2015    Past Surgical History:  Procedure Laterality Date   ABDOMINAL HYSTERECTOMY     ovaries removed   APPENDECTOMY     CARPAL TUNNEL RELEASE Bilateral    CATARACT EXTRACTION     CHOLECYSTECTOMY     IR ANGIO INTRA EXTRACRAN SEL COM CAROTID INNOMINATE BILAT MOD SED  12/30/2016   IR  ANGIO INTRA EXTRACRAN SEL COM CAROTID INNOMINATE BILAT MOD SED  03/08/2018   IR ANGIO INTRA EXTRACRAN SEL INTERNAL CAROTID UNI R MOD SED  01/07/2017   IR ANGIO VERTEBRAL SEL SUBCLAVIAN INNOMINATE UNI R MOD SED  12/30/2016   IR ANGIO VERTEBRAL SEL VERTEBRAL BILAT MOD SED  03/08/2018   IR ANGIO VERTEBRAL SEL VERTEBRAL UNI L MOD SED  12/30/2016   IR ANGIOGRAM FOLLOW UP STUDY  01/07/2017   IR ANGIOGRAM FOLLOW UP STUDY  01/07/2017   IR ANGIOGRAM FOLLOW UP STUDY  01/07/2017   IR NEURO EACH ADD'L AFTER BASIC UNI RIGHT (MS)  01/07/2017   IR RADIOLOGIST EVAL & MGMT  11/24/2016   IR RADIOLOGIST EVAL & MGMT  01/25/2017   IR TRANSCATH/EMBOLIZ  01/07/2017   JOINT REPLACEMENT Bilateral    arthroscopic - knees   RADIOLOGY WITH ANESTHESIA N/A 12/30/2016   Procedure: RADIOLOGY WITH ANESTHESIA  EMBOLIZATION;  Surgeon: Julieanne Cotton, MD;  Location: [facility] OR;  Service: Radiology;  Laterality: N/A;   RADIOLOGY WITH ANESTHESIA N/A 01/07/2017   Procedure: EMBOLIZATION;  Surgeon: Julieanne Cotton, MD;  Location: [facility] OR;  Service: Radiology;  Laterality: N/A;    Current Outpatient Medications  Medication Sig Dispense Refill   acetaminophen (TYLENOL) 500 MG tablet Take 500 mg by mouth daily as needed for mild pain.     aspirin EC 81 MG tablet Take 1 tablet (81 mg total) by mouth daily. Swallow whole. 30 tablet 12   Biotin 5000 MCG TABS Take 1 tablet by mouth daily.     cholecalciferol (VITAMIN D) 1000 units tablet Take 1,000 Units by mouth daily.     ELIQUIS 5 MG TABS tablet TAKE 1 TABLET BY MOUTH TWICE DAILY 180 tablet 3   furosemide (LASIX) 20 MG tablet TAKE 1 TABLET BY MOUTH DAILY AS NEEDED FOR LEG SWELLING (Patient taking differently: Take 20 mg by mouth daily.) 90 tablet 1   gabapentin (NEURONTIN) 100 MG capsule Take 100-200 mg by mouth See admin instructions. 100 mg in the morning, 200 mg at bedtime.     levothyroxine (SYNTHROID, LEVOTHROID) 25 MCG tablet Take 25 mcg by mouth daily.  0   lisinopril  (ZESTRIL) 10 MG tablet NEW PRESCRIPTION REQUEST: Lisinopril 10 Mg TAKE ONE TABLET BY MOUTH DAILY 90 tablet 3   meclizine (ANTIVERT) 25 MG tablet Take 1 tablet (25 mg total) by mouth 3 (three) times daily as needed for dizziness. 30 tablet 0   OVER THE COUNTER MEDICATION Take 1 each by mouth daily. Beet root gummy     rosuvastatin (CRESTOR) 5 MG tablet Take 0.5 tablets (2.5 mg total) by mouth at bedtime. 90 tablet 1   sertraline (ZOLOFT) 50 MG tablet Take 50 mg by mouth daily.     tiZANidine (ZANAFLEX) 4 MG tablet Take 1 tablet (4 mg total) by mouth every 8 (eight) hours as needed (back pain, muscle spasms). 30 tablet 0   Vibegron (GEMTESA) 75 MG TABS Take 75 mg by mouth daily.     No current facility-administered medications for this visit.   Facility-Administered Medications Ordered in Other Visits  Medication Dose Route Frequency Provider Last Rate Last Admin   ceFAZolin (ANCEF) 2 g in dextrose 5 % 100 mL IVPB  2 g Intravenous Once Lynnette Caffey A, PA-C         ALLERGIES: Shrimp (diagnostic)  Family History  Problem Relation Age of Onset   Cancer Mother    Breast cancer Sister    Heart failure Brother        deceased age 42   Cancer Brother    Cancer Brother    Cancer Sister     Social History   Socioeconomic History   Marital status: Married    Spouse name: Not on file   Number of children: 2   Years of education: Not on file   Highest education level: Not on file  Occupational History   Not on file  Tobacco Use   Smoking status: Never   Smokeless tobacco: Never  Vaping Use   Vaping status: Never Used  Substance and Sexual Activity   Alcohol use: No   Drug use: No   Sexual activity: Not Currently  Other Topics Concern   Not on file  Social History Narrative   Lives with sig other   Social Determinants of Health   Financial Resource Strain: Not on file  Food Insecurity: No Food Insecurity (08/03/2022)   Hunger Vital Sign    Worried About Running Out of  Food in the Last Year: Never true    Ran Out of Food in the Last  Year: Never true  Transportation Needs: No Transportation Needs (08/03/2022)   PRAPARE - Administrator, Civil Service (Medical): No    Lack of Transportation (Non-Medical): No  Physical Activity: Inactive (08/03/2022)   Exercise Vital Sign    Days of Exercise per Week: 0 days    Minutes of Exercise per Session: 0 min  Stress: Not on file  Social Connections: Not on file  Intimate Partner Violence: Not on file    Review of Systems  PHYSICAL EXAMINATION:    There were no vitals taken for this visit.    General appearance: alert, cooperative and appears stated age Head: Normocephalic, without obvious abnormality, atraumatic Neck: no adenopathy, supple, symmetrical, trachea midline and thyroid normal to inspection and palpation Lungs: clear to auscultation bilaterally Breasts: normal appearance, no masses or tenderness, No nipple retraction or dimpling, No nipple discharge or bleeding, No axillary or supraclavicular adenopathy Heart: regular rate and rhythm Abdomen: soft, non-tender, no masses,  no organomegaly Extremities: extremities normal, atraumatic, no cyanosis or edema Skin: Skin color, texture, turgor normal. No rashes or lesions Lymph nodes: Cervical, supraclavicular, and axillary nodes normal. No abnormal inguinal nodes palpated Neurologic: Grossly normal  Pelvic: External genitalia:  no lesions              Urethra:  normal appearing urethra with no masses, tenderness or lesions              Bartholins and Skenes: normal                 Vagina: normal appearing vagina with normal color and discharge, no lesions              Cervix: no lesions                Bimanual Exam:  Uterus:  normal size, contour, position, consistency, mobility, non-tender              Adnexa: no mass, fullness, tenderness              Rectal exam: {yes no:[id number]}.  Confirms.              Anus:  normal sphincter tone,  no lesions  Chaperone was present for exam:  ***  ASSESSMENT     PLAN     An After Visit Summary was printed and given to the patient.  ______ minutes face to face time of which over 50% was spent in counseling.

## 2023-06-22 ENCOUNTER — Ambulatory Visit: Payer: Medicare Other | Admitting: Obstetrics and Gynecology

## 2023-07-15 ENCOUNTER — Ambulatory Visit: Payer: Self-pay | Admitting: Cardiology

## 2023-08-04 ENCOUNTER — Other Ambulatory Visit: Payer: Self-pay | Admitting: Internal Medicine

## 2023-09-24 ENCOUNTER — Ambulatory Visit: Payer: Medicare Other | Attending: Cardiology | Admitting: Cardiology

## 2023-09-24 ENCOUNTER — Encounter: Payer: Self-pay | Admitting: Cardiology

## 2023-09-24 VITALS — BP 130/78 | HR 75 | Resp 16 | Ht 60.0 in | Wt 172.0 lb

## 2023-09-24 DIAGNOSIS — E7800 Pure hypercholesterolemia, unspecified: Secondary | ICD-10-CM | POA: Insufficient documentation

## 2023-09-24 DIAGNOSIS — I10 Essential (primary) hypertension: Secondary | ICD-10-CM | POA: Diagnosis not present

## 2023-09-24 DIAGNOSIS — I480 Paroxysmal atrial fibrillation: Secondary | ICD-10-CM | POA: Insufficient documentation

## 2023-09-24 NOTE — Progress Notes (Unsigned)
Cardiology Office Note:  .   Date:  09/25/2023  ID:  Sara Frye, DOB [birth year], MRN [id number] PCP: Merri Brunette, MD  [facility] Providers Cardiologist:  Yates Decamp, MD   History of Present Illness: .   Sara Frye is a 89 y.o. female with hypertension, hyperlipidemia, paroxysmal Afib, bilateral MCA and ACA multifocal punctate infarcts, after which she was started on anticaogulation on 12/18/15, history of coil embolization of anticommunicating artery aneurysm in 2018.    Discussed the use of AI scribe software for clinical note transcription with the patient, who gave verbal consent to proceed.  History of Present Illness   The patient presents for a cardiology follow-up regarding atrial fibrillation and hypertension.  She has atrial fibrillation and denies any recent episodes. She occasionally experiences palpitations, described as a 'very short' sensation, but has no chest pain or shortness of breath. Her blood pressure has been stable.  Her hypertension is well-controlled. She is on lisinopril for hypertension and denies any chest pain or shortness of breath.  She experiences occasional bowel incontinence, which occurs without warning, and manages it with protective garments. This has been an ongoing issue.   Labs   External Labs:  Care everywhere labs 02/15/2023:  TSH normal at 2.020.  Total cholesterol 165, triglycerides 162, HDL 55, LDL 82.  BUN 11, creatinine 0.88, EGFR 62 mL, potassium 4.7, LFTs normal.  Hb 12.5/HCT 39.1, platelet 303, normal indicis.  Review of Systems  Cardiovascular:  Negative for chest pain, dyspnea on exertion and leg swelling.    Physical Exam:   VS:  BP 130/78 (BP Location: Right Arm, Patient Position: Sitting, Cuff Size: Large)   Pulse 75   Resp 16   Ht 5' (1.524 m)   Wt 172 lb (78 kg)   SpO2 95%   BMI 33.59 kg/m    Wt Readings from Last 3 Encounters:  09/24/23 172 lb (78 kg)  03/23/23 172 lb 8 oz (78.2 kg)   01/12/23 167 lb (75.8 kg)     Physical Exam Neck:     Vascular: No carotid bruit or JVD.  Cardiovascular:     Rate and Rhythm: Normal rate and regular rhythm.     Pulses: Intact distal pulses.     Heart sounds: Normal heart sounds. No murmur heard.    No gallop.  Pulmonary:     Effort: Pulmonary effort is normal.     Breath sounds: Normal breath sounds.  Abdominal:     General: Bowel sounds are normal.     Palpations: Abdomen is soft.  Musculoskeletal:     Right lower leg: No edema.     Left lower leg: No edema.    Studies Reviewed: Marland Kitchen     EKG:    EKG Interpretation Date/Time:  Friday September 24 2023 10:42:45 EST Ventricular Rate:  73 PR Interval:  154 QRS Duration:  86 QT Interval:  442 QTC Calculation: 486 R Axis:   -49  Text Interpretation: EKG 09/24/2023: Normal sinus rhythm at rate of 73 bpm, left anterior fascicular block.  LVH with repolarization abnormality, cannot exclude lateral ischemia.  No significant change from 01/12/2023. Confirmed by Delrae Rend [phone]) on 09/24/2023 10:51:29 AM     Medications and allergies    Allergies  Allergen Reactions   Shrimp (Diagnostic) Itching     Current Outpatient Medications:    acetaminophen (TYLENOL) 500 MG tablet, Take 500 mg by mouth daily as needed for mild pain., Disp: , Rfl:  Biotin 5000 MCG TABS, Take 1 tablet by mouth daily., Disp: , Rfl:    cholecalciferol (VITAMIN D) 1000 units tablet, Take 1,000 Units by mouth daily., Disp: , Rfl:    ELIQUIS 5 MG TABS tablet, TAKE 1 TABLET BY MOUTH TWICE DAILY, Disp: 180 tablet, Rfl: 3   furosemide (LASIX) 20 MG tablet, TAKE 1 TABLET BY MOUTH DAILY AS NEEDED FOR LEG SWELLING (Patient taking differently: Take 20 mg by mouth daily.), Disp: 90 tablet, Rfl: 1   gabapentin (NEURONTIN) 100 MG capsule, Take 100-200 mg by mouth See admin instructions. 100 mg in the morning, 200 mg at bedtime., Disp: , Rfl:    levothyroxine (SYNTHROID, LEVOTHROID) 25 MCG tablet, Take 25 mcg  by mouth daily., Disp: , Rfl: 0   lisinopril (ZESTRIL) 10 MG tablet, NEW PRESCRIPTION REQUEST: Lisinopril 10 Mg TAKE ONE TABLET BY MOUTH DAILY, Disp: 90 tablet, Rfl: 3   meclizine (ANTIVERT) 25 MG tablet, Take 1 tablet (25 mg total) by mouth 3 (three) times daily as needed for dizziness., Disp: 30 tablet, Rfl: 0   OVER THE COUNTER MEDICATION, Take 1 each by mouth daily. Beet root gummy, Disp: , Rfl:    rosuvastatin (CRESTOR) 5 MG tablet, Take 0.5 tablets (2.5 mg total) by mouth at bedtime., Disp: 90 tablet, Rfl: 1   sertraline (ZOLOFT) 50 MG tablet, Take 50 mg by mouth daily., Disp: , Rfl:    tiZANidine (ZANAFLEX) 4 MG tablet, Take 1 tablet (4 mg total) by mouth every 8 (eight) hours as needed (back pain, muscle spasms)., Disp: 30 tablet, Rfl: 0   Vibegron (GEMTESA) 75 MG TABS, Take 75 mg by mouth daily., Disp: , Rfl:  No current facility-administered medications for this visit.  Facility-Administered Medications Ordered in Other Visits:    ceFAZolin (ANCEF) 2 g in dextrose 5 % 100 mL IVPB, 2 g, Intravenous, Once, Lynnette Caffey A, PA-C   ASSESSMENT AND PLAN: .      ICD-10-CM   1. Paroxysmal atrial fibrillation (HCC)  I48.0 EKG 12-Lead    2. Primary hypertension  I10     3. Hypercholesteremia  E78.00      Click Here to Calculate/Change CHADS2VASc Score The patient's CHADS2-VASc score is 6, indicating a 9.7% annual risk of stroke.  Therefore, anticoagulation is recommended.   CHF History: No HTN History: Yes Diabetes History: No Stroke History: Yes Vascular Disease History: No   Assessment and Plan    Atrial Fibrillation Intermittent palpitations reported. No recent episodes of sustained atrial fibrillation. Blood pressure and heart rate well controlled. EKG shows no changes and remains stable with regular rhythm. Blood work including kidney function and blood count are normal. Discussed the importance of continuing Eliquis to prevent stroke and the decision to discontinue  aspirin to avoid unnecessary bleeding risk. - Continue Eliquis 5 mg twice a day - Discontinue aspirin  Hypertension Blood pressure is well controlled with current medication regimen. No recent episodes of elevated blood pressure reported. Lisinopril effectively managing blood pressure without adverse effects. - Continue lisinopril as prescribed  Hyperlipidemia Cholesterol levels are well controlled with current medication. Recent blood work shows excellent cholesterol levels. Rosuvastatin effectively managing cholesterol with minimal dose. - Continue rosuvastatin 10 mg once a day  Peripheral Edema Leg swelling managed with as-needed use of Lasix. No new or worsening symptoms reported. Lasix used effectively to manage symptoms without adverse effects. - Continue Lasix as needed for leg swelling  General Health Maintenance Overall health is stable. Thyroid function, kidney function, and blood  count are normal. No new health issues reported. Regular follow-ups with primary care physician recommended to monitor ongoing health status. - Continue regular follow-ups with primary care physician - Maintain current lifestyle and activity level  Follow-up - Follow up with Dr. Renne Crigler. - Contact cardiologist if any new issues arise.  I will see her PRN. Patient has graduated!!   Signed,  Yates Decamp, MD, [facility] 09/25/2023, 11:53 AM [facility] [address] [place], [place] [address] Phone: [phone]. Fax:  [phone]

## 2023-09-24 NOTE — Patient Instructions (Signed)
Medication Instructions:  Your physician has recommended you make the following change in your medication: Stop aspirin  *If you need a refill on your cardiac medications before your next appointment, please call your pharmacy*   Lab Work: none If you have labs (blood work) drawn today and your tests are completely normal, you will receive your results only by: MyChart Message (if you have MyChart) OR A paper copy in the mail If you have any lab test that is abnormal or we need to change your treatment, we will call you to review the results.   Testing/Procedures: none   Follow-Up: At [facility], you and your health needs are our priority.  As part of our continuing mission to provide you with exceptional heart care, we have created designated Provider Care Teams.  These Care Teams include your primary Cardiologist (physician) and Advanced Practice Providers (APPs -  Physician Assistants and Nurse Practitioners) who all work together to provide you with the care you need, when you need it.  We recommend signing up for the patient portal called "MyChart".  Sign up information is provided on this After Visit Summary.  MyChart is used to connect with patients for Virtual Visits (Telemedicine).  Patients are able to view lab/test results, encounter notes, upcoming appointments, etc.  Non-urgent messages can be sent to your provider as well.   To learn more about what you can do with MyChart, go to [email or web address].    Your next appointment:   As needed  Provider:   Yates Decamp, MD     Other Instructions

## 2023-10-21 ENCOUNTER — Other Ambulatory Visit: Payer: Self-pay | Admitting: Cardiology

## 2023-10-21 DIAGNOSIS — I480 Paroxysmal atrial fibrillation: Secondary | ICD-10-CM

## 2023-10-21 NOTE — Telephone Encounter (Signed)
 Prescription refill request for Eliquis received. Indication: Afib  Last office visit: 09/24/23 Sara Frye)  Scr: 0.88 (02/12/23 via Labor[facility])  Age: 89 Weight: 78kg Appropriate dose. Refill sent.

## 2023-11-26 DIAGNOSIS — M810 Age-related osteoporosis without current pathological fracture: Secondary | ICD-10-CM | POA: Diagnosis not present

## 2024-01-11 DIAGNOSIS — L03116 Cellulitis of left lower limb: Secondary | ICD-10-CM | POA: Diagnosis not present

## 2024-02-16 DIAGNOSIS — E039 Hypothyroidism, unspecified: Secondary | ICD-10-CM | POA: Diagnosis not present

## 2024-02-16 DIAGNOSIS — I10 Essential (primary) hypertension: Secondary | ICD-10-CM | POA: Diagnosis not present

## 2024-02-16 DIAGNOSIS — M810 Age-related osteoporosis without current pathological fracture: Secondary | ICD-10-CM | POA: Diagnosis not present

## 2024-02-16 DIAGNOSIS — R413 Other amnesia: Secondary | ICD-10-CM | POA: Diagnosis not present

## 2024-02-21 DIAGNOSIS — F339 Major depressive disorder, recurrent, unspecified: Secondary | ICD-10-CM | POA: Diagnosis not present

## 2024-02-21 DIAGNOSIS — M25551 Pain in right hip: Secondary | ICD-10-CM | POA: Diagnosis not present

## 2024-02-21 DIAGNOSIS — R32 Unspecified urinary incontinence: Secondary | ICD-10-CM | POA: Diagnosis not present

## 2024-02-21 DIAGNOSIS — M79604 Pain in right leg: Secondary | ICD-10-CM | POA: Diagnosis not present

## 2024-02-21 DIAGNOSIS — M810 Age-related osteoporosis without current pathological fracture: Secondary | ICD-10-CM | POA: Diagnosis not present

## 2024-02-21 DIAGNOSIS — I10 Essential (primary) hypertension: Secondary | ICD-10-CM | POA: Diagnosis not present

## 2024-02-21 DIAGNOSIS — G2581 Restless legs syndrome: Secondary | ICD-10-CM | POA: Diagnosis not present

## 2024-02-21 DIAGNOSIS — I480 Paroxysmal atrial fibrillation: Secondary | ICD-10-CM | POA: Diagnosis not present

## 2024-02-21 DIAGNOSIS — M5459 Other low back pain: Secondary | ICD-10-CM | POA: Diagnosis not present

## 2024-02-21 DIAGNOSIS — R413 Other amnesia: Secondary | ICD-10-CM | POA: Diagnosis not present

## 2024-02-21 DIAGNOSIS — Z0000 Encounter for general adult medical examination without abnormal findings: Secondary | ICD-10-CM | POA: Diagnosis not present

## 2024-02-21 DIAGNOSIS — E039 Hypothyroidism, unspecified: Secondary | ICD-10-CM | POA: Diagnosis not present

## 2024-02-21 DIAGNOSIS — D6869 Other thrombophilia: Secondary | ICD-10-CM | POA: Diagnosis not present

## 2024-02-28 ENCOUNTER — Emergency Department (HOSPITAL_COMMUNITY)
Admission: EM | Admit: 2024-02-28 | Discharge: 2024-02-28 | Disposition: A | Discharge status: Home / Self Care | Attending: Emergency Medicine | Admitting: Emergency Medicine

## 2024-02-28 ENCOUNTER — Other Ambulatory Visit: Payer: Self-pay

## 2024-02-28 ENCOUNTER — Encounter (HOSPITAL_COMMUNITY): Payer: Self-pay | Admitting: *Deleted

## 2024-02-28 ENCOUNTER — Emergency Department (HOSPITAL_COMMUNITY)

## 2024-02-28 DIAGNOSIS — Z79899 Other long term (current) drug therapy: Secondary | ICD-10-CM | POA: Insufficient documentation

## 2024-02-28 DIAGNOSIS — M79661 Pain in right lower leg: Secondary | ICD-10-CM | POA: Diagnosis not present

## 2024-02-28 DIAGNOSIS — Z7901 Long term (current) use of anticoagulants: Secondary | ICD-10-CM | POA: Insufficient documentation

## 2024-02-28 DIAGNOSIS — M47816 Spondylosis without myelopathy or radiculopathy, lumbar region: Secondary | ICD-10-CM | POA: Diagnosis not present

## 2024-02-28 DIAGNOSIS — R609 Edema, unspecified: Secondary | ICD-10-CM | POA: Diagnosis not present

## 2024-02-28 DIAGNOSIS — M48061 Spinal stenosis, lumbar region without neurogenic claudication: Secondary | ICD-10-CM | POA: Diagnosis not present

## 2024-02-28 DIAGNOSIS — I10 Essential (primary) hypertension: Secondary | ICD-10-CM | POA: Insufficient documentation

## 2024-02-28 DIAGNOSIS — E871 Hypo-osmolality and hyponatremia: Secondary | ICD-10-CM | POA: Insufficient documentation

## 2024-02-28 DIAGNOSIS — M79651 Pain in right thigh: Secondary | ICD-10-CM | POA: Diagnosis not present

## 2024-02-28 DIAGNOSIS — M1711 Unilateral primary osteoarthritis, right knee: Secondary | ICD-10-CM | POA: Diagnosis not present

## 2024-02-28 DIAGNOSIS — M4316 Spondylolisthesis, lumbar region: Secondary | ICD-10-CM | POA: Diagnosis not present

## 2024-02-28 DIAGNOSIS — W19XXXA Unspecified fall, initial encounter: Secondary | ICD-10-CM | POA: Diagnosis not present

## 2024-02-28 DIAGNOSIS — M7989 Other specified soft tissue disorders: Secondary | ICD-10-CM | POA: Diagnosis not present

## 2024-02-28 DIAGNOSIS — M545 Low back pain, unspecified: Secondary | ICD-10-CM | POA: Diagnosis not present

## 2024-02-28 DIAGNOSIS — R600 Localized edema: Secondary | ICD-10-CM | POA: Diagnosis not present

## 2024-02-28 LAB — CBC WITH DIFFERENTIAL/PLATELET
Abs Immature Granulocytes: 0.02 K/uL (ref 0.00–0.07)
Basophils Absolute: 0 K/uL (ref 0.0–0.1)
Basophils Relative: 0 %
Eosinophils Absolute: 0.1 K/uL (ref 0.0–0.5)
Eosinophils Relative: 1 %
HCT: 39 % (ref 36.0–46.0)
Hemoglobin: 13.1 g/dL (ref 12.0–15.0)
Immature Granulocytes: 0 %
Lymphocytes Relative: 24 %
Lymphs Abs: 2.1 K/uL (ref 0.7–4.0)
MCH: 29.8 pg (ref 26.0–34.0)
MCHC: 33.6 g/dL (ref 30.0–36.0)
MCV: 88.6 fL (ref 80.0–100.0)
Monocytes Absolute: 1.1 K/uL — ABNORMAL HIGH (ref 0.1–1.0)
Monocytes Relative: 12 %
Neutro Abs: 5.6 K/uL (ref 1.7–7.7)
Neutrophils Relative %: 63 %
Platelets: 293 K/uL (ref 150–400)
RBC: 4.4 MIL/uL (ref 3.87–5.11)
RDW: 13.1 % (ref 11.5–15.5)
WBC: 9 K/uL (ref 4.0–10.5)
nRBC: 0 % (ref 0.0–0.2)

## 2024-02-28 LAB — BASIC METABOLIC PANEL WITH GFR
Anion gap: 10 (ref 5–15)
BUN: 10 mg/dL (ref 8–23)
CO2: 27 mmol/L (ref 22–32)
Calcium: 9 mg/dL (ref 8.9–10.3)
Chloride: 96 mmol/L — ABNORMAL LOW (ref 98–111)
Creatinine, Ser: 0.64 mg/dL (ref 0.44–1.00)
GFR, Estimated: >60 mL/min (ref >60)
Glucose, Bld: 76 mg/dL (ref 70–99)
Potassium: 3.6 mmol/L (ref 3.5–5.1)
Sodium: 133 mmol/L — ABNORMAL LOW (ref 135–145)

## 2024-02-28 MED ORDER — HYDROCODONE-ACETAMINOPHEN 5-325 MG PO TABS: 1.0000 | ORAL_TABLET | ORAL | 0 refills | Status: AC | PRN

## 2024-02-28 MED ORDER — HYDROCODONE-ACETAMINOPHEN 5-325 MG PO TABS
1.0000 | ORAL_TABLET | ORAL | Status: AC
  Administered 2024-02-28: 1 via ORAL
  Filled 2024-02-28: qty 1

## 2024-02-28 NOTE — ED Provider Notes (Signed)
  Physical Exam  BP (!) 185/72 (BP Location: Right Arm)   Pulse 74   Temp 97.6 F (36.4 C) (Oral)   Resp 18   Ht 5' (1.524 m)   Wt 86.2 kg   SpO2 100%   BMI 37.11 kg/m   Physical Exam  Procedures  Procedures  ED Course / MDM   Clinical Course as of 02/28/24 1836  Mon Feb 28, 2024  1634 Signed out to Dr Lavonia [RP]  8191 Negative dvt ultrasound   [TL]    Clinical Course User Index [RP] Yolande Lamar BROCKS, MD [TL] Simon Lavonia SAILOR, MD   Medical Decision Making Amount and/or Complexity of Data Reviewed Labs: ordered. Radiology: ordered.  Risk Prescription drug management.    Signout: history of chronic back pain. Htn. HLD. A fib. Right thigh pain. Worse with weight. Xrays benign. Dispo pending DVT u/s.    DVT ultrasound negative.  Did show some abnormal lymph nodes in the groin area.  Do not think this was causing the pain.  Explained this to the patient as well as daughter that they would need to follow-up for soft tissue ultrasound through PCP.  She does have reproducible back pain in the right lower sacral/lumbar area.  This is likely what is causing the pain in the thigh region with radiculopathy.  Warm and well-perfused lower extremities.  No concerns for Ischemic disease process.  She will follow-up with her PCP.  She will also follow-up with spine surgery.        Simon Lavonia SAILOR, MD 02/28/24 [phone]

## 2024-02-28 NOTE — Discharge Instructions (Addendum)
 You were seen for your thigh pain in the emergency department.   At home, please take Tylenol  and use over-the-counter creams like IcyHot for your pain. You can purchase lidocaine  patch as well.  You may also take the norco we have prescribed you for any breakthrough pain that may have.  Please note that it contains tylenol  so do not exceed the daily recommended dosage of tylenol . Do not take this before driving or operating heavy machinery.  Do not take this medication with alcohol.  Check your MyChart online for the results of any tests that had not resulted by the time you left the emergency department.   Follow-up with your primary doctor in 2-3 days regarding your visit.    Return immediately to the emergency department if you experience any of the following: Worsening pain, numbness or weakness of your legs, bowel or bladder incontinence, or any other concerning symptoms.     You did have some abnormal lymph nodes in your groin area. Please follow up with your primary care physician to have repeat imaging of this   Thank you for visiting our Emergency Department. It was a pleasure taking care of you today.

## 2024-02-28 NOTE — ED Provider Notes (Signed)
 [place] EMERGENCY DEPARTMENT AT [facility] Provider Note   CSN: [id number] Arrival date & time: 02/28/24  1242     Patient presents with: Knee Pain   Sara Frye is a 89 y.o. female.  {Add pertinent medical, surgical, social history, OB history to HPI:2267} 89 year old female with a history of hypertension, hyperlipidemia, A-fib, and stroke on Eliquis  who presents to the emergency department with right thigh pain.  Patient reports that 4 weeks ago she fell.  Initially was not having any pain but then approximately 3 weeks ago started having some right thigh pain.  Says it is worse when she tries to bear weight.  No numbness or weakness of her leg.  No swelling of her leg.  No joint effusions.  Reportedly had x-rays as an outpatient that were normal and was referred into the emergency department for evaluation.       Prior to Admission medications   Medication Sig Start Date End Date Taking? Authorizing Provider  acetaminophen  (TYLENOL ) 500 MG tablet Take 500 mg by mouth daily as needed for mild pain.    [provider]  apixaban  (ELIQUIS ) 5 MG TABS tablet TAKE 1 TABLET BY MOUTH TWICE DAILY 10/21/23   Ladona Heinz, MD  Biotin 5000 MCG TABS Take 1 tablet by mouth daily.    [provider]  cholecalciferol (VITAMIN D) 1000 units tablet Take 1,000 Units by mouth daily.    [provider]  furosemide (LASIX) 20 MG tablet TAKE 1 TABLET BY MOUTH DAILY AS NEEDED FOR LEG SWELLING Patient taking differently: Take 20 mg by mouth daily. 12/17/21   Cantwell, Celeste C, PA-C  gabapentin  (NEURONTIN ) 100 MG capsule Take 100-200 mg by mouth See admin instructions. 100 mg in the morning, 200 mg at bedtime.    [provider]  levothyroxine  (SYNTHROID , LEVOTHROID) 25 MCG tablet Take 25 mcg by mouth daily. 02/07/16   [provider]  lisinopril  (ZESTRIL ) 10 MG tablet NEW PRESCRIPTION REQUEST: Lisinopril  10 Mg TAKE ONE TABLET BY MOUTH DAILY  08/14/22   Custovic, Sabina, DO  meclizine  (ANTIVERT ) 25 MG tablet Take 1 tablet (25 mg total) by mouth 3 (three) times daily as needed for dizziness. 07/22/22   Lue Elsie BROCKS, MD  OVER THE COUNTER MEDICATION Take 1 each by mouth daily. Beet root gummy    [provider]  rosuvastatin  (CRESTOR ) 5 MG tablet Take 0.5 tablets (2.5 mg total) by mouth at bedtime. 09/28/18   Ladona Heinz, MD  sertraline  (ZOLOFT ) 50 MG tablet Take 50 mg by mouth daily.    [provider]  tiZANidine  (ZANAFLEX ) 4 MG tablet Take 1 tablet (4 mg total) by mouth every 8 (eight) hours as needed (back pain, muscle spasms). 08/05/22   Georgina Ozell LABOR, MD  Vibegron (GEMTESA) 75 MG TABS Take 75 mg by mouth daily.    [provider]    Allergies: Shrimp (diagnostic)    Review of Systems  Updated Vital Signs BP 126/66 (BP Location: Right Arm)   Pulse 71   Temp 97.6 F (36.4 C) (Oral)   Resp 18   Ht 5' (1.524 m)   Wt 86.2 kg   SpO2 96%   BMI 37.11 kg/m   Physical Exam Vitals and nursing note reviewed.  Constitutional:      General: She is not in acute distress.    Appearance: She is well-developed.  HENT:     Head: Normocephalic and atraumatic.     Right Ear: External ear  normal.     Left Ear: External ear normal.     Nose: Nose normal.  Eyes:     Extraocular Movements: Extraocular movements intact.     Conjunctiva/sclera: Conjunctivae normal.     Pupils: Pupils are equal, round, and reactive to light.  Musculoskeletal:     Cervical back: Normal range of motion and neck supple.     Right lower leg: Edema (1+) present.     Left lower leg: Edema (1+) present.     Comments: Palpable pulse in left dorsalis pedis.  Dopplerable pulse in right dorsalis pedis.  No joint effusion of the right hip or knee.  No erythema of the right knee.  No lumbar spine tenderness to palpation.  Intact sensation to light touch in the right lower extremity dermatomes.  Difficulty flexing right hip and knee  due to pain.  Skin:    General: Skin is warm and dry.  Neurological:     Mental Status: She is alert and oriented to person, place, and time. Mental status is at baseline.  Psychiatric:        Mood and Affect: Mood normal.     (all labs ordered are listed, but only abnormal results are displayed) Labs Reviewed - No data to display  EKG: None  Radiology: No results found.  {Document cardiac monitor, telemetry assessment procedure when appropriate:32947} Procedures   Medications Ordered in the ED - No data to display    {Click here for ABCD2, HEART and other calculators REFRESH Note before signing:1}                              Medical Decision Making Amount and/or Complexity of Data Reviewed Labs: ordered. Radiology: ordered.  Risk Prescription drug management.   ***  {Document critical care time when appropriate  Document review of labs and clinical decision tools ie CHADS2VASC2, etc  Document your independent review of radiology images and any outside records  Document your discussion with family members, caretakers and with consultants  Document social determinants of health affecting pt's care  Document your decision making why or why not admission, treatments were needed:32947:::1}   Final diagnoses:  None    ED Discharge Orders     None

## 2024-02-28 NOTE — ED Triage Notes (Addendum)
 BIB EMS with rt knee pain, pt states she fell about 4 days ago and went to [facility]. Nothing was found on x-ray, continues to have pain and swelling. 150/80-77-18-94% RA CBG 79   Pt states pain in in femur area between top of knee and hip.

## 2024-02-28 NOTE — Progress Notes (Addendum)
 VASCULAR LAB   Right lower extremity venous duplex has been performed.  See CV proc for preliminary results.  Gave verbal report to Dr. Simon LIS, [facility], RVT 02/28/2024, 5:51 PM

## 2024-02-28 NOTE — ED Notes (Signed)
 Pt gone for x rays

## 2024-03-09 ENCOUNTER — Other Ambulatory Visit: Payer: Self-pay | Admitting: Cardiology

## 2024-03-09 DIAGNOSIS — I480 Paroxysmal atrial fibrillation: Secondary | ICD-10-CM

## 2024-03-09 DIAGNOSIS — R590 Localized enlarged lymph nodes: Secondary | ICD-10-CM | POA: Diagnosis not present

## 2024-03-09 DIAGNOSIS — M5431 Sciatica, right side: Secondary | ICD-10-CM | POA: Diagnosis not present

## 2024-03-09 NOTE — Telephone Encounter (Signed)
 Prescription refill request for Eliquis  received. Indication: afib  Last office visit: [place], 09/24/2023 Scr: 0.64, 02/28/2024 Age: 89 yo  Weight: 86.2 kg   Refill sent.

## 2024-03-16 ENCOUNTER — Other Ambulatory Visit: Payer: Self-pay | Admitting: Internal Medicine

## 2024-03-16 DIAGNOSIS — R590 Localized enlarged lymph nodes: Secondary | ICD-10-CM

## 2024-03-20 ENCOUNTER — Other Ambulatory Visit (INDEPENDENT_AMBULATORY_CARE_PROVIDER_SITE_OTHER): Payer: Self-pay

## 2024-03-20 ENCOUNTER — Ambulatory Visit (INDEPENDENT_AMBULATORY_CARE_PROVIDER_SITE_OTHER): Admitting: Orthopedic Surgery

## 2024-03-20 DIAGNOSIS — M545 Low back pain, unspecified: Secondary | ICD-10-CM | POA: Diagnosis not present

## 2024-03-20 DIAGNOSIS — G8929 Other chronic pain: Secondary | ICD-10-CM

## 2024-03-20 NOTE — Progress Notes (Signed)
 Orthopedic Spine Surgery Office Note  Assessment: Patient is a 89 y.o. female with low back pain that radiates to the right lateral hip and thigh.  Has a spondylolisthesis and stenosis at L4/5 that I suspect is causing her pain   Plan: -Explained that initially conservative treatment is tried as a significant number of patients may experience relief with these treatment modalities. Discussed that the conservative treatments include:  -activity modification  -physical therapy  -over the counter pain medications  -medrol  dosepak  -lumbar steroid injections -Patient has tried Medrol  Dosepak, Norco -Since her pain has gotten significantly better, recommended no further intervention at this time - Neck step would be a L4/5 injection should her pain return.  She will call the office if she does develop the pain again -Patient should return to office on an as-needed basis   Patient expressed understanding of the plan and all questions were answered to the patient's satisfaction.   ___________________________________________________________________________   History:  Patient is a 89 y.o. female who presents today for lumbar spine.  Patient has had about a month of low back pain that radiates into the right lateral hip and lateral thigh.  It does not radiate past the knee.  She has no pain rating into the left lower extremity.  There is no trauma or injury that preceded the onset of the pain.  She felt the pain with activity and with rest.  She does have pain when laying at night on that side.  Her pain has improved significantly over the last week.  Has not had any bowel or bladder incontinence.  No saddle anesthesia.  Treatments tried: medrol  dose pak, norco   Physical Exam:  General: no acute distress, appears stated age Neurologic: alert, answering questions appropriately, following commands Respiratory: unlabored breathing on room air, symmetric chest rise Psychiatric: appropriate  affect, normal cadence to speech   MSK (spine):  -Strength exam      Left  Right EHL    5/5  5/5 TA    5/5  5/5 GSC    5/5  5/5 Knee extension  5/5  5/5 Hip flexion   5/5  5/5  -Sensory exam    Sensation intact to light touch in L3-S1 nerve distributions of bilateral lower extremities  TTP over the greater trochanter of the right femur, no other tenderness to palpation over the extremity  Imaging: XRs of the lumbar spine from 03/20/2024 were independently reviewed and interpreted, showing spondylolisthesis at L4/5.  Disc height loss at L4/5 and L5/S1.  Old compression fracture at L1.  No other fracture seen.  No dislocation seen.  MRI of the lumbar spine from 10/19/2019 was independently reviewed and interpreted, showing bilateral foraminal, lateral recess, and central stenosis at L4/5.  Spondylolisthesis at L4/5.    Patient name: Sara Frye Patient MRN: [id number] Date of visit: 03/20/24

## 2024-04-11 ENCOUNTER — Inpatient Hospital Stay
Admission: RE | Admit: 2024-04-11 | Discharge: 2024-04-11 | Discharge status: Home / Self Care | Source: Ambulatory Visit | Attending: Internal Medicine | Admitting: Internal Medicine

## 2024-04-11 DIAGNOSIS — R590 Localized enlarged lymph nodes: Secondary | ICD-10-CM

## 2024-04-11 DIAGNOSIS — R599 Enlarged lymph nodes, unspecified: Secondary | ICD-10-CM | POA: Diagnosis not present

## 2024-04-20 DIAGNOSIS — R590 Localized enlarged lymph nodes: Secondary | ICD-10-CM | POA: Diagnosis not present

## 2024-04-20 DIAGNOSIS — I10 Essential (primary) hypertension: Secondary | ICD-10-CM | POA: Diagnosis not present

## 2024-04-23 DIAGNOSIS — R590 Localized enlarged lymph nodes: Secondary | ICD-10-CM | POA: Diagnosis not present

## 2024-05-05 ENCOUNTER — Encounter: Payer: Self-pay | Admitting: Medical Oncology

## 2024-05-09 NOTE — Progress Notes (Unsigned)
 [facility] Telephone:([phone]) [phone]   Fax:(336) [phone]  INITIAL CONSULTATION:  Patient Care Team: Clarice Nottingham, MD as PCP - General (Internal Medicine) Ladona Heinz, MD as PCP - Cardiology (Cardiology) Golden Forestine BROCKS, RN as Oncology Nurse Navigator (Medical Oncology)  CHIEF COMPLAINTS/PURPOSE OF CONSULTATION:  Bilateral inguinal lymphadenopathy  HISTORY OF PRESENTING ILLNESS:  Sara Frye 89 y.o. female with medical history significant for stroke, hypothyroidism, hypertension, atrial fibrillation presents to the rapid diagnostic clinic for evaluation for bilateral inguinal lymphadenopathy. She is accompanied by her daughter for this visit.   On review of the previous records, [name] presented to the emergency room on 02/28/2023 for right lower extremity pain x 2 weeks. Venous ultrasound was obtained that showed no evidence of DVT but enlarged lymph nodes in the groin. Pelvic ultrasound was obtained on 04/11/2024 that showed enlarged lymph nodes in the bilateral inguinal regions.The largest in the right region measures 2.1 x 0.8 x 1.2 cm. The largest is in the left inguinal region measures 2.3 x 1.9 x 1.5 cm.  On exam today, [name] reports that she is doing well without any major changes to her health. Her energy is stable and expected for her age. She has a good appetite without any weight changes. She denies nausea, vomiting or bowel habit changes. She denies any pain at this time including abdominal pain or groin pain. She reports occasional episodes of positional dizziness but denies any syncopal episodes. She denies easy bruising or overt signs of bleeding including hematochezia or melena. She denies fevers, chills, sweats, shortness of breath, chest pain or cough. She has no other complaints. Rest of the ROS is below.   MEDICAL HISTORY:  Past Medical History:  Diagnosis Date   Arthritis    spine   Atrial fibrillation (HCC)     Brain aneurysm 01/07/2017   Dysrhythmia    afib   H/O cerebral aneurysm repair ACA S/P [place] by Dr. Dolphus 01/07/17 01/07/2017   Headache    with Eliquis    Hypertension    Hypothyroidism    Maternal blood transfusion    Neuromuscular disorder (HCC)    spinal injury- I'm unsure of walking, I'm very careful   Shingles    Stroke (HCC) 11/2015    SURGICAL HISTORY: Past Surgical History:  Procedure Laterality Date   ABDOMINAL HYSTERECTOMY     ovaries removed   APPENDECTOMY     CARPAL TUNNEL RELEASE Bilateral    CATARACT EXTRACTION     CHOLECYSTECTOMY     IR ANGIO INTRA EXTRACRAN SEL COM CAROTID INNOMINATE BILAT MOD SED  12/30/2016   IR ANGIO INTRA EXTRACRAN SEL COM CAROTID INNOMINATE BILAT MOD SED  03/08/2018   IR ANGIO INTRA EXTRACRAN SEL INTERNAL CAROTID UNI R MOD SED  01/07/2017   IR ANGIO VERTEBRAL SEL SUBCLAVIAN INNOMINATE UNI R MOD SED  12/30/2016   IR ANGIO VERTEBRAL SEL VERTEBRAL BILAT MOD SED  03/08/2018   IR ANGIO VERTEBRAL SEL VERTEBRAL UNI L MOD SED  12/30/2016   IR ANGIOGRAM FOLLOW UP STUDY  01/07/2017   IR ANGIOGRAM FOLLOW UP STUDY  01/07/2017   IR ANGIOGRAM FOLLOW UP STUDY  01/07/2017   IR NEURO EACH ADD'L AFTER BASIC UNI RIGHT (MS)  01/07/2017   IR RADIOLOGIST EVAL & MGMT  11/24/2016   IR RADIOLOGIST EVAL & MGMT  01/25/2017   IR TRANSCATH/EMBOLIZ  01/07/2017   JOINT REPLACEMENT Bilateral    arthroscopic - knees   RADIOLOGY WITH ANESTHESIA N/A 12/30/2016  Procedure: RADIOLOGY WITH ANESTHESIA  EMBOLIZATION;  Surgeon: Dolphus Carrion, MD;  Location: [facility] OR;  Service: Radiology;  Laterality: N/A;   RADIOLOGY WITH ANESTHESIA N/A 01/07/2017   Procedure: EMBOLIZATION;  Surgeon: Dolphus Carrion, MD;  Location: [facility] OR;  Service: Radiology;  Laterality: N/A;    SOCIAL HISTORY: Social History   Socioeconomic History   Marital status: Married    Spouse name: Not on file   Number of children: 2   Years of education: Not on file   Highest education level:  Not on file  Occupational History   Not on file  Tobacco Use   Smoking status: Never   Smokeless tobacco: Never  Vaping Use   Vaping status: Never Used  Substance and Sexual Activity   Alcohol use: No   Drug use: No   Sexual activity: Not Currently  Other Topics Concern   Not on file  Social History Narrative   Lives with sig other   Social Drivers of Health   Financial Resource Strain: Not on file  Food Insecurity: No Food Insecurity (08/03/2022)   Hunger Vital Sign    Worried About Running Out of Food in the Last Year: Never true    Ran Out of Food in the Last Year: Never true  Transportation Needs: No Transportation Needs (08/03/2022)   PRAPARE - Administrator, Civil Service (Medical): No    Lack of Transportation (Non-Medical): No  Physical Activity: Inactive (08/03/2022)   Exercise Vital Sign    Days of Exercise per Week: 0 days    Minutes of Exercise per Session: 0 min  Stress: Not on file  Social Connections: Not on file  Intimate Partner Violence: Not on file    FAMILY HISTORY: Family History  Problem Relation Age of Onset   Cancer Mother    Breast cancer Sister    Colon cancer Sister    Cancer Sister    Heart failure Brother        deceased age 53   Lung cancer Brother    Vasculitis Brother     ALLERGIES:  is allergic to shrimp (diagnostic) and valacyclovir.  MEDICATIONS:  Current Outpatient Medications  Medication Sig Dispense Refill   acetaminophen  (TYLENOL ) 500 MG tablet Take 500 mg by mouth daily as needed for mild pain.     acetaminophen  (TYLENOL ) 500 MG tablet Take 500 mg by mouth every 4 (four) hours as needed.     Biotin 5000 MCG TABS Take 1 tablet by mouth daily.     cholecalciferol (VITAMIN D) 1000 units tablet Take 1,000 Units by mouth daily.     ELIQUIS  5 MG TABS tablet TAKE 1 TABLET BY MOUTH TWICE DAILY 180 tablet 1   furosemide (LASIX) 20 MG tablet TAKE 1 TABLET BY MOUTH DAILY AS NEEDED FOR LEG SWELLING (Patient taking  differently: Take 20 mg by mouth daily.) 90 tablet 1   gabapentin  (NEURONTIN ) 100 MG capsule Take 100-200 mg by mouth See admin instructions. 100 mg in the morning, 200 mg at bedtime.     levothyroxine  (SYNTHROID , LEVOTHROID) 25 MCG tablet Take 25 mcg by mouth daily.  0   lisinopril  (ZESTRIL ) 10 MG tablet NEW PRESCRIPTION REQUEST: Lisinopril  10 Mg TAKE ONE TABLET BY MOUTH DAILY 90 tablet 3   meclizine  (ANTIVERT ) 25 MG tablet Take 1 tablet (25 mg total) by mouth 3 (three) times daily as needed for dizziness. 30 tablet 0   OVER THE COUNTER MEDICATION Take 1 each by mouth daily. Beet root  gummy     rosuvastatin  (CRESTOR ) 5 MG tablet Take 0.5 tablets (2.5 mg total) by mouth at bedtime. 90 tablet 1   sertraline  (ZOLOFT ) 50 MG tablet Take 50 mg by mouth daily.     traMADol  (ULTRAM ) 50 MG tablet Take 50 mg by mouth every 6 (six) hours as needed.     Vibegron (GEMTESA) 75 MG TABS Take 75 mg by mouth daily.     HYDROcodone -acetaminophen  (NORCO/VICODIN) 5-325 MG tablet Take 1 tablet by mouth every 4 (four) hours as needed. (Patient not taking: Reported on 05/10/2024) 10 tablet 0   tiZANidine  (ZANAFLEX ) 4 MG tablet Take 1 tablet (4 mg total) by mouth every 8 (eight) hours as needed (back pain, muscle spasms). (Patient not taking: Reported on 05/10/2024) 30 tablet 0   No current facility-administered medications for this visit.   Facility-Administered Medications Ordered in Other Visits  Medication Dose Route Frequency Provider Last Rate Last Admin   ceFAZolin  (ANCEF ) 2 g in dextrose  5 % 100 mL IVPB  2 g Intravenous Once Neita Kirsch A, PA-C        REVIEW OF SYSTEMS:   Constitutional: ( - ) fevers, ( - )  chills , ( - ) night sweats Eyes: ( - ) blurriness of vision, ( - ) double vision, ( - ) watery eyes Ears, nose, mouth, throat, and face: ( - ) mucositis, ( - ) sore throat Respiratory: ( - ) cough, ( - ) dyspnea, ( - ) wheezes Cardiovascular: ( - ) palpitation, ( - ) chest discomfort, ( - ) lower  extremity swelling Gastrointestinal:  ( - ) nausea, ( - ) heartburn, ( - ) change in bowel habits Skin: ( - ) abnormal skin rashes Lymphatics: ( - ) new lymphadenopathy, ( - ) easy bruising Neurological: ( - ) numbness, ( - ) tingling, ( - ) new weaknesses Behavioral/Psych: ( - ) mood change, ( - ) new changes  All other systems were reviewed with the patient and are negative.  PHYSICAL EXAMINATION: ECOG PERFORMANCE STATUS: 1 - Symptomatic but completely ambulatory  Vitals:   05/10/24 1413  BP: (!) 142/71  Pulse: 67  Resp: 13  Temp: 97.7 F (36.5 C)  SpO2: 95%   Filed Weights   05/10/24 1413  Weight: 172 lb 3.2 oz (78.1 kg)    GENERAL: well appearing elderly female in NAD  SKIN: skin color, texture, turgor are normal, no rashes or significant lesions EYES: conjunctiva are pink and non-injected, sclera clear OROPHARYNX: no exudate, no erythema; lips, buccal mucosa, and tongue normal  NECK: supple, non-tender LYMPH:  no palpable lymphadenopathy in the cervical, axillary, inguinal or supraclavicular lymph nodes.  LUNGS: clear to auscultation and percussion with normal breathing effort HEART: regular rate & rhythm and no murmurs and no lower extremity edema ABDOMEN: soft, non-tender, non-distended, normal bowel sounds Musculoskeletal: no cyanosis of digits and no clubbing  PSYCH: alert & oriented x 3, fluent speech NEURO: no focal motor/sensory deficits  LABORATORY DATA:  I have reviewed the data as listed    Latest Ref Rng & Units 05/10/2024    3:11 PM 02/28/2024    1:52 PM 07/22/2022    7:07 AM  CBC  WBC 4.0 - 10.5 K/uL 5.9  9.0  5.2   Hemoglobin 12.0 - 15.0 g/dL 87.0  86.8  87.3   Hematocrit 36.0 - 46.0 % 38.2  39.0  38.6   Platelets 150 - 400 K/uL 256  293  256  Latest Ref Rng & Units 05/10/2024    3:11 PM 02/28/2024    1:52 PM 07/22/2022    7:07 AM  CMP  Glucose 70 - 99 mg/dL 91  76  98   BUN 8 - 23 mg/dL 14  10  6    Creatinine 0.44 - 1.00 mg/dL 9.26  9.35   9.24   Sodium 135 - 145 mmol/L 140  133  134   Potassium 3.5 - 5.1 mmol/L 4.3  3.6  3.8   Chloride 98 - 111 mmol/L 104  96  102   CO2 22 - 32 mmol/L 30  27  23    Calcium  8.9 - 10.3 mg/dL 9.5  9.0  8.3   Total Protein 6.5 - 8.1 g/dL 7.6     Total Bilirubin 0.0 - 1.2 mg/dL 0.3     Alkaline Phos 38 - 126 U/L 61     AST 15 - 41 U/L 18     ALT 0 - 44 U/L 14        RADIOGRAPHIC STUDIES: I have personally reviewed the radiological images as listed and agreed with the findings in the report. No results found.   ASSESSMENT & PLAN Rohini P Teall is a 89 y.o. female who presents to the rapid diagnostic clinic for evaluation for inguinal lymphadenopathy.   #Bilateral Inguinal Lymphadenopathy: --Differentials include infectious process, inflammatory process, lymphoproliferative disorder or metastatic disease.  --Labs today to check CBC, CMP, LDH, flow cytometry, ESR and CRP levels --Recommend CT abdomen/pelvis to further evaluate and determine if biopsy is needed --RTC once workup is complete.   Orders Placed This Encounter  Procedures   CT ABDOMEN PELVIS W CONTRAST    Wt /No Needs /nt diabetic/nkda to iv dye or contrast/NO HBP/no kid dz or liver dz /No spinal cord/No body injector/no glucose mon/no heart monitor/NO PORT ab [email or web address]  [facility] [phone] Please remember if you need to cancel your appt, please do so 24 hours prior to your appointment to avoid getting charged a no-show fee of $75.00 pt is aware/pt verbal understood instructions given    Standing Status:   Future    Expected Date:   05/11/2024    Expiration Date:   05/10/2025    If indicated for the ordered procedure, I authorize the administration of contrast media per Radiology protocol:   Yes    Does the patient have a contrast media/X-ray dye allergy?:   No    Preferred imaging location?:   [facility]    If indicated for the ordered procedure, I authorize the administration of oral contrast media per  Radiology protocol:   Yes   CT ABDOMEN PELVIS W CONTRAST    Standing Status:   Future    Expected Date:   05/12/2024    Expiration Date:   05/11/2025    If indicated for the ordered procedure, I authorize the administration of contrast media per Radiology protocol:   Yes    Does the patient have a contrast media/X-ray dye allergy?:   No    Preferred imaging location?:   [facility]    If indicated for the ordered procedure, I authorize the administration of oral contrast media per Radiology protocol:   Yes   CBC with Differential (Cancer Center Only)    Standing Status:   Future    Number of Occurrences:   1    Expiration Date:   05/10/2025   CMP (Cancer Center only)    Standing Status:  Future    Number of Occurrences:   1    Expiration Date:   05/10/2025   Lactate dehydrogenase (LDH)    Standing Status:   Future    Number of Occurrences:   1    Expiration Date:   05/10/2025   Flow Cytometry, Peripheral Blood (Oncology)    Standing Status:   Future    Number of Occurrences:   1    Expiration Date:   05/10/2025   Sedimentation rate    Standing Status:   Future    Number of Occurrences:   1    Expiration Date:   05/10/2025   C-reactive protein    Standing Status:   Future    Number of Occurrences:   1    Expiration Date:   05/10/2025    All questions were answered. The patient knows to call the clinic with any problems, questions or concerns.  I have spent a total of 60 minutes minutes of face-to-face and non-face-to-face time, preparing to see the patient, obtaining and/or reviewing separately obtained history, performing a medically appropriate examination, counseling and educating the patient, ordering medications/tests/procedures, referring and communicating with other health care professionals, documenting clinical information in the electronic health record, independently interpreting results and communicating results to the patient, and care coordination.   Johnston Police,  PA-C Department of Hematology/Oncology [facility] at [facility] Phone: [phone]  Patient was seen with Dr. Autumn.

## 2024-05-10 ENCOUNTER — Encounter: Payer: Self-pay | Admitting: Physician Assistant

## 2024-05-10 ENCOUNTER — Inpatient Hospital Stay

## 2024-05-10 ENCOUNTER — Inpatient Hospital Stay: Attending: Physician Assistant | Admitting: Physician Assistant

## 2024-05-10 ENCOUNTER — Encounter: Payer: Self-pay | Admitting: Medical Oncology

## 2024-05-10 VITALS — BP 142/71 | HR 67 | Temp 97.7°F | Resp 13 | Wt 172.2 lb

## 2024-05-10 DIAGNOSIS — Z803 Family history of malignant neoplasm of breast: Secondary | ICD-10-CM | POA: Diagnosis not present

## 2024-05-10 DIAGNOSIS — Z800 Family history of malignant neoplasm of digestive organs: Secondary | ICD-10-CM | POA: Diagnosis not present

## 2024-05-10 DIAGNOSIS — R590 Localized enlarged lymph nodes: Secondary | ICD-10-CM | POA: Insufficient documentation

## 2024-05-10 DIAGNOSIS — R591 Generalized enlarged lymph nodes: Secondary | ICD-10-CM

## 2024-05-10 DIAGNOSIS — Z801 Family history of malignant neoplasm of trachea, bronchus and lung: Secondary | ICD-10-CM | POA: Insufficient documentation

## 2024-05-10 LAB — CBC WITH DIFFERENTIAL (CANCER CENTER ONLY)
Abs Immature Granulocytes: 0.01 K/uL (ref 0.00–0.07)
Basophils Absolute: 0 K/uL (ref 0.0–0.1)
Basophils Relative: 1 %
Eosinophils Absolute: 0.2 K/uL (ref 0.0–0.5)
Eosinophils Relative: 3 %
HCT: 38.2 % (ref 36.0–46.0)
Hemoglobin: 12.9 g/dL (ref 12.0–15.0)
Immature Granulocytes: 0 %
Lymphocytes Relative: 33 %
Lymphs Abs: 1.9 K/uL (ref 0.7–4.0)
MCH: 29.7 pg (ref 26.0–34.0)
MCHC: 33.8 g/dL (ref 30.0–36.0)
MCV: 87.8 fL (ref 80.0–100.0)
Monocytes Absolute: 0.5 K/uL (ref 0.1–1.0)
Monocytes Relative: 9 %
Neutro Abs: 3.2 K/uL (ref 1.7–7.7)
Neutrophils Relative %: 54 %
Platelet Count: 256 K/uL (ref 150–400)
RBC: 4.35 MIL/uL (ref 3.87–5.11)
RDW: 13.4 % (ref 11.5–15.5)
WBC Count: 5.9 K/uL (ref 4.0–10.5)
nRBC: 0 % (ref 0.0–0.2)

## 2024-05-10 LAB — CMP (CANCER CENTER ONLY)
ALT: 14 U/L (ref 0–44)
AST: 18 U/L (ref 15–41)
Albumin: 4.5 g/dL (ref 3.5–5.0)
Alkaline Phosphatase: 61 U/L (ref 38–126)
Anion gap: 6 (ref 5–15)
BUN: 14 mg/dL (ref 8–23)
CO2: 30 mmol/L (ref 22–32)
Calcium: 9.5 mg/dL (ref 8.9–10.3)
Chloride: 104 mmol/L (ref 98–111)
Creatinine: 0.73 mg/dL (ref 0.44–1.00)
GFR, Estimated: >60 mL/min (ref >60)
Glucose, Bld: 91 mg/dL (ref 70–99)
Potassium: 4.3 mmol/L (ref 3.5–5.1)
Sodium: 140 mmol/L (ref 135–145)
Total Bilirubin: 0.3 mg/dL (ref 0.0–1.2)
Total Protein: 7.6 g/dL (ref 6.5–8.1)

## 2024-05-10 LAB — C-REACTIVE PROTEIN: CRP: 0.7 mg/dL (ref <1.0)

## 2024-05-10 LAB — SEDIMENTATION RATE: Sed Rate: 12 mm/h (ref 0–22)

## 2024-05-10 LAB — LACTATE DEHYDROGENASE: LDH: 155 U/L (ref 98–192)

## 2024-05-10 NOTE — Progress Notes (Signed)
 [facility]  Patient presented to clinic, with her daughter, for her scheduled appointment with PA-C Johnston Police. I introduced myself and provided them with my direct contact information. Patient and daughter were both encouraged to call me with any questions/concerns they may have.  [name], RN, BSN, [facility] Oncology Nurse Navigator, [facility] 05/10/2024 3:53 PM

## 2024-05-11 ENCOUNTER — Encounter: Payer: Self-pay | Admitting: Medical Oncology

## 2024-05-11 LAB — SURGICAL PATHOLOGY

## 2024-05-11 NOTE — Progress Notes (Signed)
 [facility]  Call to patient's daughter, Mrs. Glendia, to inform of CT appointment scheduled for 05/16/24 at [facility]. Address provided and informed will need to pick up 2 bottles of contrast, as well as Mrs. Hutt needs to have nothing by mouth four hours prior to imaging. Verbal understanding received and Mrs. Scott encouraged to call me with questions/concerns.   [name], RN, BSN, [facility] Oncology Nurse Navigator, [facility] 05/11/2024 12:54 PM

## 2024-05-12 ENCOUNTER — Other Ambulatory Visit

## 2024-05-12 LAB — FLOW CYTOMETRY

## 2024-05-15 ENCOUNTER — Encounter: Payer: Self-pay | Admitting: Medical Oncology

## 2024-05-15 NOTE — Progress Notes (Signed)
 [facility]  Call to patient's daughter, Mrs. Glendia, to inform her of patient's lab results. Per DEVONNA Lis, labs are unremarkable. Daughter confirmed that she saw the results via MyChart. Daughter denies questions at this time, confirms CT appointment for tomorrow and has contrast. Daughter encouraged to call with any questions/concerns.   [name], RN, BSN, [facility] Oncology Nurse Navigator, [facility] 05/15/2024 3:13 PM

## 2024-05-15 NOTE — Progress Notes (Signed)
 ADDENDUM:  Patient was personally and independently interviewed, examined and relevant elements of the history of present illness were reviewed in details and an assessment and plan was created. All elements of the patient's history of present illness, assessment and plan were discussed in detail with Johnston Police, PA-C. The above documentation reflects our combined findings assessment and plan.    Briefly, 89 year old pleasant lady with nonspecific bilateral inguinal lymphadenopathy.  Will obtain CT abdomen and pelvis and determine need for biopsy.  Also obtained additional workup including flow cytometry of peripheral blood, ESR, CRP today.  Patient will be seen in clinic with these results.

## 2024-05-16 ENCOUNTER — Ambulatory Visit
Admission: RE | Admit: 2024-05-16 | Discharge: 2024-05-16 | Disposition: A | Discharge status: Home / Self Care | Source: Ambulatory Visit | Attending: Physician Assistant | Admitting: Physician Assistant

## 2024-05-16 DIAGNOSIS — R599 Enlarged lymph nodes, unspecified: Secondary | ICD-10-CM | POA: Diagnosis not present

## 2024-05-16 DIAGNOSIS — R591 Generalized enlarged lymph nodes: Secondary | ICD-10-CM

## 2024-05-16 DIAGNOSIS — R590 Localized enlarged lymph nodes: Secondary | ICD-10-CM | POA: Diagnosis not present

## 2024-05-16 MED ORDER — IOPAMIDOL (ISOVUE-300) INJECTION 61%
90.0000 mL | Freq: Once | INTRAVENOUS | Status: AC | PRN
  Administered 2024-05-16: 90 mL via INTRAVENOUS

## 2024-05-18 ENCOUNTER — Other Ambulatory Visit: Payer: Self-pay | Admitting: Physician Assistant

## 2024-05-18 ENCOUNTER — Telehealth: Payer: Self-pay | Admitting: Physician Assistant

## 2024-05-18 NOTE — Telephone Encounter (Signed)
 I spoke to patient's daughter, Dorian Hamilton, to review the CT scan results from 05/16/2024.   Findings showed subcentimeter inguinal lymph nodes most consistent with reactive process. Recommend no further workup at this time.   There is an incidental  pancreatic tail lesion versus normal lobulation, incompletely assessed on current exam.  Discussed MRI to further evaluate versus repeat CT abdomen/pelvis in 3 months.   Patient prefers to repeat CT in 3 months. I will arrange this with follow up with Dr. Autumn.   Ms. Hamilton expressed understanding of the plan provided.

## 2024-05-22 ENCOUNTER — Encounter: Payer: Self-pay | Admitting: Cardiology

## 2024-05-25 ENCOUNTER — Encounter: Payer: Self-pay | Admitting: Physician Assistant

## 2024-06-15 DIAGNOSIS — M810 Age-related osteoporosis without current pathological fracture: Secondary | ICD-10-CM | POA: Diagnosis not present

## 2024-06-20 DIAGNOSIS — Z23 Encounter for immunization: Secondary | ICD-10-CM | POA: Diagnosis not present

## 2024-06-26 ENCOUNTER — Encounter: Payer: Self-pay | Admitting: Radiology

## 2024-07-24 ENCOUNTER — Encounter: Payer: Self-pay | Admitting: Medical Oncology

## 2024-07-24 NOTE — Progress Notes (Signed)
 [facility]  Spoke with patient's daughter, Mrs. Glendia, regarding patient's CT appointment scheduled for 08/01/24 at 1:20 PM and for them to pick up contrast needed to be taken prior to appointment. Mrs. Glendia confirms understanding. Encouraged to call me with further questions.   [name], RN, BSN,  Oncology Nurse Navigator, [facility] 07/24/2024 1:49 PM

## 2024-08-01 ENCOUNTER — Inpatient Hospital Stay
Admission: RE | Admit: 2024-08-01 | Discharge: 2024-08-01 | Disposition: A | Source: Ambulatory Visit | Attending: Physician Assistant

## 2024-08-01 DIAGNOSIS — R591 Generalized enlarged lymph nodes: Secondary | ICD-10-CM

## 2024-08-01 MED ORDER — IOPAMIDOL (ISOVUE-300) INJECTION 61%
100.0000 mL | Freq: Once | INTRAVENOUS | Status: AC | PRN
  Administered 2024-08-01: 100 mL via INTRAVENOUS

## 2024-08-04 ENCOUNTER — Ambulatory Visit: Admitting: Cardiology

## 2024-08-09 ENCOUNTER — Other Ambulatory Visit: Payer: Self-pay | Admitting: Oncology

## 2024-08-09 ENCOUNTER — Inpatient Hospital Stay: Attending: Physician Assistant

## 2024-08-09 ENCOUNTER — Inpatient Hospital Stay (HOSPITAL_BASED_OUTPATIENT_CLINIC_OR_DEPARTMENT_OTHER): Admitting: Oncology

## 2024-08-09 VITALS — BP 137/79 | HR 70 | Temp 98.1°F | Resp 17 | Ht 60.0 in | Wt 177.6 lb

## 2024-08-09 DIAGNOSIS — D379 Neoplasm of uncertain behavior of digestive organ, unspecified: Secondary | ICD-10-CM

## 2024-08-09 DIAGNOSIS — R590 Localized enlarged lymph nodes: Secondary | ICD-10-CM | POA: Insufficient documentation

## 2024-08-09 DIAGNOSIS — K869 Disease of pancreas, unspecified: Secondary | ICD-10-CM | POA: Diagnosis not present

## 2024-08-09 DIAGNOSIS — R591 Generalized enlarged lymph nodes: Secondary | ICD-10-CM

## 2024-08-09 DIAGNOSIS — N281 Cyst of kidney, acquired: Secondary | ICD-10-CM | POA: Insufficient documentation

## 2024-08-09 LAB — CMP (CANCER CENTER ONLY)
ALT: 17 U/L (ref 0–44)
AST: 25 U/L (ref 15–41)
Albumin: 4.3 g/dL (ref 3.5–5.0)
Alkaline Phosphatase: 64 U/L (ref 38–126)
Anion gap: 12 (ref 5–15)
BUN: 13 mg/dL (ref 8–23)
CO2: 25 mmol/L (ref 22–32)
Calcium: 9.5 mg/dL (ref 8.9–10.3)
Chloride: 105 mmol/L (ref 98–111)
Creatinine: 0.78 mg/dL (ref 0.44–1.00)
GFR, Estimated: >60 mL/min (ref >60)
Glucose, Bld: 99 mg/dL (ref 70–99)
Potassium: 4.2 mmol/L (ref 3.5–5.1)
Sodium: 141 mmol/L (ref 135–145)
Total Bilirubin: 0.2 mg/dL (ref 0.0–1.2)
Total Protein: 7.3 g/dL (ref 6.5–8.1)

## 2024-08-09 LAB — CBC WITH DIFFERENTIAL (CANCER CENTER ONLY)
Abs Immature Granulocytes: 0.01 K/uL (ref 0.00–0.07)
Basophils Absolute: 0 K/uL (ref 0.0–0.1)
Basophils Relative: 0 %
Eosinophils Absolute: 0.2 K/uL (ref 0.0–0.5)
Eosinophils Relative: 4 %
HCT: 38.6 % (ref 36.0–46.0)
Hemoglobin: 13.1 g/dL (ref 12.0–15.0)
Immature Granulocytes: 0 %
Lymphocytes Relative: 39 %
Lymphs Abs: 2.1 K/uL (ref 0.7–4.0)
MCH: 29.7 pg (ref 26.0–34.0)
MCHC: 33.9 g/dL (ref 30.0–36.0)
MCV: 87.5 fL (ref 80.0–100.0)
Monocytes Absolute: 0.5 K/uL (ref 0.1–1.0)
Monocytes Relative: 9 %
Neutro Abs: 2.7 K/uL (ref 1.7–7.7)
Neutrophils Relative %: 48 %
Platelet Count: 274 K/uL (ref 150–400)
RBC: 4.41 MIL/uL (ref 3.87–5.11)
RDW: 13.9 % (ref 11.5–15.5)
WBC Count: 5.5 K/uL (ref 4.0–10.5)
nRBC: 0 % (ref 0.0–0.2)

## 2024-08-09 LAB — C-REACTIVE PROTEIN: CRP: <3 mg/dL — ABNORMAL HIGH (ref <1.0)

## 2024-08-09 LAB — SEDIMENTATION RATE: Sed Rate: 7 mm/h (ref 0–22)

## 2024-08-09 LAB — LACTATE DEHYDROGENASE: LDH: 203 U/L (ref 105–235)

## 2024-08-09 MED ORDER — LORAZEPAM 0.5 MG PO TABS: ORAL_TABLET | ORAL | 0 refills | Status: DC

## 2024-08-09 NOTE — Progress Notes (Signed)
 "  [facility]  ONCOLOGY CLINIC PROGRESS NOTE   Patient Care Team: Sara Nottingham, MD as PCP - General (Internal Medicine) Sara Heinz, MD as PCP - Cardiology (Cardiology) Sara Forestine BROCKS, RN as Oncology Nurse Navigator (Medical Oncology)  PATIENT NAME: Sara Frye   MR#: [id number] DOB: 27-Mar-1929  Date of visit: 08/09/2024   ASSESSMENT & PLAN:   Sara Frye is a 89 y.o. pleasant lady with past medical history of CVA, hypothyroidism, hypertension, atrial fibrillation, was initially referred to a rapid diagnostic clinic for evaluation of bilateral inguinal lymphadenopathy.  Felt to be reactive.  Also has small pancreatic lesion, pending MRI.  Pancreatic lesion A 1.2 cm lesion in the pancreas is present. Differential diagnosis includes intraductal papillary mucinous neoplasm (IPMN) or other benign pancreatic lesions. MRI is recommended for better characterization of the lesion. - Order MRI of the pancreas for further evaluation. - Prescribe Ativan  for anxiety management prior to MRI, to be sent to [facility] [place] [facility].  Renal cyst A possible cyst is present on the left kidney. MRI will help characterize the lesion.  Lymphadenopathy Lymph nodes remain small with no change in size. Previous tests showed normal inflammatory markers and negative marrow involvement. Current blood counts are normal, suggesting reactive lymphadenopathy.  Flow cytometry of peripheral blood was unremarkable.  CRP, ESR were also within normal limits.  LDH normal.  Follow-up Plan to review MRI results and reassess in four weeks. Attempt to schedule MRI by December 29th. - Schedule follow-up appointment in four weeks to discuss MRI results.   I reviewed lab results and outside records for this visit and discussed relevant results with the patient. Diagnosis, plan of care and treatment options were also discussed in detail with the patient. Opportunity provided to  ask questions and answers provided to her apparent satisfaction. Provided instructions to call our clinic with any problems, questions or concerns prior to return visit. I recommended to continue follow-up with PCP and sub-specialists. She verbalized understanding and agreed with the plan.   NCCN guidelines have been consulted in the planning of this patients care.  I spent a total of 35 minutes during this encounter with the patient including review of chart and various tests results, discussions about plan of care and coordination of care plan.   Sara Patten, MD  08/09/2024 3:44 PM  [facility] - A DEPT [facility] [address] [place] [address] Dept: [phone] Dept Fax: [phone]    CHIEF COMPLAINT/ REASON FOR VISIT:   Nonspecific inguinal lymph nodes, most likely reactive.  Nonspecific pancreatic tail lesion versus normal lobulation.  INTERVAL HISTORY:    Discussed the use of AI scribe software for clinical note transcription with the patient, who gave verbal consent to proceed.  History of Present Illness Sara Frye is a 89 year old female who presents for follow-up of stable lymphadenopathy and pancreatic and renal lesions.  She is undergoing surveillance for small lymphadenopathy of unclear etiology, a stable 1.2 cm pancreatic lesion (possible IPMN), and a left renal lesion (possible cyst). Recent imaging demonstrates no change in the size of these findings.  She is asymptomatic, denying fevers, chills, night sweats, abdominal pain, changes in appetite, or gastrointestinal symptoms. She reports no constipation, diarrhea, hematochezia, or melena. She has gained weight and describes her sleep as good.  Laboratory evaluation, including inflammatory markers and bone marrow studies, has been unremarkable, and repeat  blood counts today are within normal limits.     I have reviewed the past  medical history, past surgical history, social history and family history with the patient and they are unchanged from previous note.  HISTORY OF PRESENT ILLNESS:   PERTINENT HISTORY:  89 y.o. lady with medical history significant for stroke, hypothyroidism, hypertension, atrial fibrillation presents to the rapid diagnostic clinic for evaluation for bilateral inguinal lymphadenopathy. She is accompanied by her daughter for this visit.    On review of the previous records, Sara Frye presented to the emergency room on 02/28/2023 for right lower extremity pain x 2 weeks. Venous ultrasound was obtained that showed no evidence of DVT but enlarged lymph nodes in the groin. Pelvic ultrasound was obtained on 04/11/2024 that showed enlarged lymph nodes in the bilateral inguinal regions.The largest in the right region measures 2.1 x 0.8 x 1.2 cm. The largest is in the left inguinal region measures 2.3 x 1.9 x 1.5 cm.  Flow cytometry of peripheral blood was unremarkable.  CBCD and CMP were unremarkable.  CT abdomen and pelvis on 05/16/2024 showed query pancreatic tail lesion versus normal lobulation, incompletely assessed on current exam. Recommend dedicated MRI with contrast for further assessment if clinically warranted. Bilateral subcentimeter and pericentimeter inguinal lymph nodes, likely reactive.  On 08/01/2024, repeat CT abdomen and pelvis showed unchanged prominent bilateral inguinal lymph nodes measuring up to 1.1 cm on the left and right. Unchanged 1.2 cm mildly hyperattenuating lesion arising from the anterior aspect of the pancreatic body. Recommend further evaluation with contrast-enhanced MRI/MRCP.  Exophytic 1.6 cm hypodense lesion arising from the lateral interpolar left kidney does not measure simple fluid attenuation. This finding can also be evaluated on the above recommended MRI.  Inguinal lymphadenopathy was felt to be reactive.  For further evaluation of pancreatic lesion and renal cyst,  MRI of the abdomen/MRCP was requested.  REVIEW OF SYSTEMS:   Review of Systems - Oncology  All other pertinent systems were reviewed with the patient and are negative.  ALLERGIES: She is allergic to shrimp (diagnostic) and valacyclovir.  MEDICATIONS:  Current Outpatient Medications  Medication Sig Dispense Refill   acetaminophen  (TYLENOL ) 500 MG tablet Take 500 mg by mouth daily as needed for mild pain.     acetaminophen  (TYLENOL ) 500 MG tablet Take 500 mg by mouth every 4 (four) hours as needed.     Biotin 5000 MCG TABS Take 1 tablet by mouth daily.     cholecalciferol (VITAMIN D) 1000 units tablet Take 1,000 Units by mouth daily.     ELIQUIS  5 MG TABS tablet TAKE 1 TABLET BY MOUTH TWICE DAILY 180 tablet 1   furosemide (LASIX) 20 MG tablet TAKE 1 TABLET BY MOUTH DAILY AS NEEDED FOR LEG SWELLING (Patient taking differently: Take 20 mg by mouth daily.) 90 tablet 1   gabapentin  (NEURONTIN ) 100 MG capsule Take 100-200 mg by mouth See admin instructions. 100 mg in the morning, 200 mg at bedtime.     levothyroxine  (SYNTHROID , LEVOTHROID) 25 MCG tablet Take 25 mcg by mouth daily.  0   lisinopril  (ZESTRIL ) 10 MG tablet NEW PRESCRIPTION REQUEST: Lisinopril  10 Mg TAKE ONE TABLET BY MOUTH DAILY 90 tablet 3   meclizine  (ANTIVERT ) 25 MG tablet Take 1 tablet (25 mg total) by mouth 3 (three) times daily as needed for dizziness. 30 tablet 0   OVER THE COUNTER MEDICATION Take 1 each by mouth daily. Beet root gummy     rosuvastatin  (CRESTOR ) 5 MG tablet Take 0.5  tablets (2.5 mg total) by mouth at bedtime. 90 tablet 1   sertraline  (ZOLOFT ) 50 MG tablet Take 50 mg by mouth daily.     traMADol  (ULTRAM ) 50 MG tablet Take 50 mg by mouth every 6 (six) hours as needed.     Vibegron (GEMTESA) 75 MG TABS Take 75 mg by mouth daily.     HYDROcodone -acetaminophen  (NORCO/VICODIN) 5-325 MG tablet Take 1 tablet by mouth every 4 (four) hours as needed. (Patient not taking: Reported on 08/09/2024) 10 tablet 0    tiZANidine  (ZANAFLEX ) 4 MG tablet Take 1 tablet (4 mg total) by mouth every 8 (eight) hours as needed (back pain, muscle spasms). (Patient not taking: Reported on 08/09/2024) 30 tablet 0   No current facility-administered medications for this visit.   Facility-Administered Medications Ordered in Other Visits  Medication Dose Route Frequency Provider Last Rate Last Admin   ceFAZolin  (ANCEF ) 2 g in dextrose  5 % 100 mL IVPB  2 g Intravenous Once Neita Kirsch A, PA-C         VITALS:   Blood pressure 137/79, pulse 70, temperature 98.1 F (36.7 C), temperature source Temporal, resp. rate 17, height 5' (1.524 m), weight 177 lb 9.6 oz (80.6 kg), SpO2 99%.  Wt Readings from Last 3 Encounters:  08/09/24 177 lb 9.6 oz (80.6 kg)  05/10/24 172 lb 3.2 oz (78.1 kg)  02/28/24 190 lb (86.2 kg)    Body mass index is 34.69 kg/m.   Onc Performance Status - 08/09/24 1518       ECOG Perf Status   ECOG Perf Status Capable of only limited selfcare, confined to bed or chair more than 50% of waking hours      KPS SCALE   KPS % SCORE Requires occasional assistance but is able to care for most needs          PHYSICAL EXAM:   Physical Exam Constitutional:      General: She is not in acute distress.    Appearance: Normal appearance.  HENT:     Head: Normocephalic and atraumatic.  Cardiovascular:     Rate and Rhythm: Normal rate.  Pulmonary:     Effort: Pulmonary effort is normal. No respiratory distress.  Abdominal:     General: There is no distension.  Neurological:     General: No focal deficit present.     Mental Status: She is alert and oriented to person, place, and time.  Psychiatric:        Mood and Affect: Mood normal.        Behavior: Behavior normal.       LABORATORY DATA:   I have reviewed the data as listed.  Results for orders placed or performed in visit on 08/09/24  Surgical pathology  Result Value Ref Range   SURGICAL PATHOLOGY      Surgical Pathology CASE:  [id number] PATIENT: Marabeth Aune Flow Pathology Report     Clinical history: Lymphadenopathy     DIAGNOSIS:  - No abnormal B or T-cell population identified   GATING AND PHENOTYPIC ANALYSIS:  Gated population: Flow cytometric immunophenotyping is performed using antibodies to the antigens listed in the table below. Electronic gates are placed around a cell cluster displaying light scatter properties corresponding to: lymphocytes  Abnormal Cells in gated population: N/A  Phenotype of Abnormal Cells: N/A                       Lymphoid Antigens  Myeloid Antigens Miscellaneous CD2  tested    CD10 tested    CD11b     ND   CD45 tested CD3  tested    CD19 tested    CD11c     ND   HLA-Dr    ND CD4  tested    CD20 tested    CD13 ND   CD34 tested CD5  tested    CD22 ND   CD14 ND   CD38 tested CD7  tested    CD79b     ND   CD15 ND   CD138     ND CD8  tested    CD103     ND   CD16 ND   TdT  ND CD25 ND   CD200     tested     CD33 ND   CD123     ND TCRab     ND   sKappa    tested    CD64 ND   CD41 ND TCRgd     tested    sLambda   tested    CD117     ND   CD61 ND CD56 tested    cKappa    ND   MPO  ND   CD71 ND CD57 ND   cLambda   ND        [id number]       GROSS DESCRIPTION:  One lavender top tube submitted from [facility] for lymphoma testing.    Final Diagnosis performed by Ilsa Pottier, MD.   Electronically signed 08/10/2024 Technical and / or Professional components performed at [facility], [address] [place], [place] [address].  The above tests were developed and their performance characteristics determined by the [facility] system for the physical and immunophenotypic characterization of cell populations. They have not been cleared by the U.S. Food and Drug administration. The  FDA has determined that such clearance or approval is not necessary. This test is used for clinical purposes. It should not be  regarded as investigational  or for research   Results for orders placed or performed in visit on 08/09/24  Sedimentation rate  Result Value Ref Range   Sed Rate 7 0 - 22 mm/hr  Lactate dehydrogenase  Result Value Ref Range   LDH 203 105 - 235 U/L  Flow Cytometry, Peripheral Blood (Oncology)  Result Value Ref Range   Flow Cytometry SEE SEPARATE REPORT   CMP (Cancer Center only)  Result Value Ref Range   Sodium 141 135 - 145 mmol/L   Potassium 4.2 3.5 - 5.1 mmol/L   Chloride 105 98 - 111 mmol/L   CO2 25 22 - 32 mmol/L   Glucose, Bld 99 70 - 99 mg/dL   BUN 13 8 - 23 mg/dL   Creatinine 9.21 9.55 - 1.00 mg/dL   Calcium  9.5 8.9 - 10.3 mg/dL   Total Protein 7.3 6.5 - 8.1 g/dL   Albumin 4.3 3.5 - 5.0 g/dL   AST 25 15 - 41 U/L   ALT 17 0 - 44 U/L   Alkaline Phosphatase 64 38 - 126 U/L   Total Bilirubin 0.2 0.0 - 1.2 mg/dL   GFR, Estimated >39 >39 mL/min   Anion gap 12 5 - 15  CBC with Differential (Cancer Center Only)  Result Value Ref Range   WBC Count 5.5 4.0 - 10.5 K/uL   RBC 4.41 3.87 - 5.11 MIL/uL   Hemoglobin 13.1 12.0 - 15.0 g/dL   HCT  38.6 36.0 - 46.0 %   MCV 87.5 80.0 - 100.0 fL   MCH 29.7 26.0 - 34.0 pg   MCHC 33.9 30.0 - 36.0 g/dL   RDW 86.0 88.4 - 84.4 %   Platelet Count 274 150 - 400 K/uL   nRBC 0.0 0.0 - 0.2 %   Neutrophils Relative % 48 %   Neutro Abs 2.7 1.7 - 7.7 K/uL   Lymphocytes Relative 39 %   Lymphs Abs 2.1 0.7 - 4.0 K/uL   Monocytes Relative 9 %   Monocytes Absolute 0.5 0.1 - 1.0 K/uL   Eosinophils Relative 4 %   Eosinophils Absolute 0.2 0.0 - 0.5 K/uL   Basophils Relative 0 %   Basophils Absolute 0.0 0.0 - 0.1 K/uL   Immature Granulocytes 0 %   Abs Immature Granulocytes 0.01 0.00 - 0.07 K/uL  C-reactive protein  Result Value Ref Range   CRP <3.0 (H) <1.0 mg/dL      RADIOGRAPHIC STUDIES:  I have personally reviewed the radiological images as listed and agree with the findings in the report.  CT ABDOMEN PELVIS W CONTRAST CLINICAL DATA:  Pelvic  lymphadenopathy  EXAM: CT ABDOMEN AND PELVIS WITH CONTRAST  TECHNIQUE: Multidetector CT imaging of the abdomen and pelvis was performed using the standard protocol following bolus administration of intravenous contrast.  RADIATION DOSE REDUCTION: This exam was performed according to the departmental dose-optimization program which includes automated exposure control, adjustment of the mA and/or kV according to patient size and/or use of iterative reconstruction technique.  CONTRAST:  ISOVUE -300 IOPAMIDOL  (ISOVUE -300) INJECTION 61%  COMPARISON:  CT abdomen pelvis dated 05/16/2024  FINDINGS: Lower chest: No focal consolidation or pulmonary nodule in the lung bases. No pleural effusion or pneumothorax demonstrated. Partially imaged heart size is normal. Mitral annular calcifications. Coronary artery calcifications.  Hepatobiliary: No focal hepatic lesions. No intra or extrahepatic biliary ductal dilation. Cholecystectomy.  Pancreas: Unchanged 1.2 cm mildly hyperattenuating lesion arising from the anterior aspect of the pancreatic body (2:29). No main pancreatic ductal dilation.  Spleen: Normal in size without focal abnormality.  Adrenals/Urinary Tract: No adrenal nodules. No calculi or hydronephrosis. Exophytic 1.6 cm hypodense lesion arising from the lateral interpolar left kidney (2:26) does not measure simple fluid attenuation. Additional bilateral subcentimeter hypodensities, too small to characterize. No focal bladder wall thickening.  Stomach/Bowel: Small hiatal hernia. Normal appearance of the stomach. No evidence of bowel wall thickening, distention, or inflammatory changes. Appendix is not discretely seen.  Vascular/Lymphatic: Aortic atherosclerosis. Unchanged prominent bilateral inguinal lymph nodes measuring up to 1.1 cm on the left (2:74) and right (2: 76).  Reproductive: No adnexal masses.  Other: No free fluid, fluid collection, or free  air.  Musculoskeletal: No acute or abnormal lytic or blastic osseous lesions. Multilevel degenerative changes of the partially imaged thoracic and lumbar spine. Unchanged superior endplate compression deformity of L1 and grade 1 anterolisthesis at L4-5. Small fat-containing right inguinal hernia.  IMPRESSION: 1. Unchanged prominent bilateral inguinal lymph nodes measuring up to 1.1 cm on the left and right. 2. Unchanged 1.2 cm mildly hyperattenuating lesion arising from the anterior aspect of the pancreatic body. Recommend further evaluation with contrast-enhanced MRI/MRCP. 3. Exophytic 1.6 cm hypodense lesion arising from the lateral interpolar left kidney does not measure simple fluid attenuation. This finding can also be evaluated on the above recommended MRI. 4.  Aortic Atherosclerosis (ICD10-I70.0).  Electronically Signed   By: Limin  Xu M.D.   On: 08/01/2024 15:23   CODE STATUS:  Code  Status History     Date Active Date Inactive Code Status Order ID Comments User Context   07/21/2022 0932 07/23/2022 1839 Full Code [id number]  Verdene Purchase, MD Inpatient   01/07/2017 1459 01/08/2017 1735 Full Code [id number]  Dolphus Carrion, MD Inpatient   12/17/2015 0046 12/18/2015 1847 Full Code [id number]  Charlton Evalene RAMAN, MD ED       Orders Placed This Encounter  Procedures   MR ABDOMEN MRCP W WO CONTAST    Standing Status:   Future    Expected Date:   08/21/2024    Expiration Date:   08/09/2025    If indicated for the ordered procedure, I authorize the administration of contrast media per Radiology protocol:   Yes    What is the patient's sedation requirement?:   Oral Sedation    Does the patient have a pacemaker or implanted devices?:   No    Preferred imaging location?:   [facility] (table limit - 500lbs)   CBC with Differential (Cancer Center Only)    Standing Status:   Future    Expiration Date:   08/17/2025   CMP (Cancer Center only)    Standing Status:    Future    Expiration Date:   08/17/2025   Lactate dehydrogenase    Standing Status:   Future    Expiration Date:   08/17/2025   Cancer antigen 19-9    Standing Status:   Future    Expiration Date:   08/17/2025   CEA (Access)    Standing Status:   Future    Expiration Date:   08/17/2025     Future Appointments  Date Time Provider Department Center  09/01/2024 12:30 PM [facility]MR 1 [facility]MRI   09/06/2024  2:15 PM [facility] LAB [facility] None  09/06/2024  2:45 PM Rehman Levinson, MD [facility] None  10/10/2024 10:00 AM Sara Heinz, MD [facility] H[facility]V     This document was completed utilizing speech recognition software. Grammatical errors, random word insertions, pronoun errors, and incomplete sentences are an occasional consequence of this system due to software limitations, ambient noise, and hardware issues. Any formal questions or concerns about the content, text or information contained within the body of this dictation should be directly addressed to the provider for clarification.   "

## 2024-08-10 LAB — SURGICAL PATHOLOGY

## 2024-08-11 LAB — FLOW CYTOMETRY

## 2024-08-17 ENCOUNTER — Encounter: Payer: Self-pay | Admitting: Oncology

## 2024-08-17 DIAGNOSIS — K869 Disease of pancreas, unspecified: Secondary | ICD-10-CM | POA: Insufficient documentation

## 2024-09-01 ENCOUNTER — Ambulatory Visit (HOSPITAL_COMMUNITY)

## 2024-09-02 ENCOUNTER — Ambulatory Visit (HOSPITAL_COMMUNITY): Admission: RE | Admit: 2024-09-02 | Discharge: 2024-09-02 | Attending: Oncology

## 2024-09-02 ENCOUNTER — Other Ambulatory Visit (HOSPITAL_COMMUNITY): Payer: Self-pay | Admitting: Internal Medicine

## 2024-09-02 DIAGNOSIS — K869 Disease of pancreas, unspecified: Secondary | ICD-10-CM | POA: Diagnosis present

## 2024-09-02 DIAGNOSIS — K862 Cyst of pancreas: Secondary | ICD-10-CM | POA: Diagnosis present

## 2024-09-02 DIAGNOSIS — K863 Pseudocyst of pancreas: Secondary | ICD-10-CM | POA: Diagnosis present

## 2024-09-02 MED ORDER — GADOBUTROL 1 MMOL/ML IV SOLN
6.0000 mL | Freq: Once | INTRAVENOUS | Status: AC | PRN
  Administered 2024-09-02: 6 mL via INTRAVENOUS

## 2024-09-06 ENCOUNTER — Inpatient Hospital Stay (HOSPITAL_BASED_OUTPATIENT_CLINIC_OR_DEPARTMENT_OTHER): Admitting: Oncology

## 2024-09-06 ENCOUNTER — Inpatient Hospital Stay: Attending: Physician Assistant

## 2024-09-06 VITALS — BP 167/73 | HR 66 | Temp 97.6°F | Resp 19 | Wt 177.7 lb

## 2024-09-06 DIAGNOSIS — R591 Generalized enlarged lymph nodes: Secondary | ICD-10-CM

## 2024-09-06 DIAGNOSIS — N281 Cyst of kidney, acquired: Secondary | ICD-10-CM | POA: Diagnosis not present

## 2024-09-06 DIAGNOSIS — R590 Localized enlarged lymph nodes: Secondary | ICD-10-CM | POA: Insufficient documentation

## 2024-09-06 DIAGNOSIS — K869 Disease of pancreas, unspecified: Secondary | ICD-10-CM

## 2024-09-06 DIAGNOSIS — D379 Neoplasm of uncertain behavior of digestive organ, unspecified: Secondary | ICD-10-CM

## 2024-09-06 DIAGNOSIS — K863 Pseudocyst of pancreas: Secondary | ICD-10-CM | POA: Insufficient documentation

## 2024-09-06 DIAGNOSIS — K7460 Unspecified cirrhosis of liver: Secondary | ICD-10-CM | POA: Insufficient documentation

## 2024-09-06 DIAGNOSIS — K7689 Other specified diseases of liver: Secondary | ICD-10-CM | POA: Insufficient documentation

## 2024-09-06 LAB — CBC WITH DIFFERENTIAL (CANCER CENTER ONLY)
Abs Immature Granulocytes: 0.01 K/uL (ref 0.00–0.07)
Basophils Absolute: 0 K/uL (ref 0.0–0.1)
Basophils Relative: 0 %
Eosinophils Absolute: 0.2 K/uL (ref 0.0–0.5)
Eosinophils Relative: 3 %
HCT: 39.9 % (ref 36.0–46.0)
Hemoglobin: 13.4 g/dL (ref 12.0–15.0)
Immature Granulocytes: 0 %
Lymphocytes Relative: 32 %
Lymphs Abs: 2.2 K/uL (ref 0.7–4.0)
MCH: 29.6 pg (ref 26.0–34.0)
MCHC: 33.6 g/dL (ref 30.0–36.0)
MCV: 88.3 fL (ref 80.0–100.0)
Monocytes Absolute: 0.8 K/uL (ref 0.1–1.0)
Monocytes Relative: 11 %
Neutro Abs: 3.7 K/uL (ref 1.7–7.7)
Neutrophils Relative %: 54 %
Platelet Count: 256 K/uL (ref 150–400)
RBC: 4.52 MIL/uL (ref 3.87–5.11)
RDW: 13.6 % (ref 11.5–15.5)
WBC Count: 6.9 K/uL (ref 4.0–10.5)
nRBC: 0 % (ref 0.0–0.2)

## 2024-09-06 LAB — CMP (CANCER CENTER ONLY)
ALT: 15 U/L (ref 0–44)
AST: 25 U/L (ref 15–41)
Albumin: 4.7 g/dL (ref 3.5–5.0)
Alkaline Phosphatase: 64 U/L (ref 38–126)
Anion gap: 14 (ref 5–15)
BUN: 13 mg/dL (ref 8–23)
CO2: 26 mmol/L (ref 22–32)
Calcium: 9.5 mg/dL (ref 8.9–10.3)
Chloride: 100 mmol/L (ref 98–111)
Creatinine: 0.84 mg/dL (ref 0.44–1.00)
GFR, Estimated: >60 mL/min
Glucose, Bld: 88 mg/dL (ref 70–99)
Potassium: 4 mmol/L (ref 3.5–5.1)
Sodium: 140 mmol/L (ref 135–145)
Total Bilirubin: 0.3 mg/dL (ref 0.0–1.2)
Total Protein: 7.9 g/dL (ref 6.5–8.1)

## 2024-09-06 LAB — LACTATE DEHYDROGENASE: LDH: 204 U/L (ref 105–235)

## 2024-09-06 LAB — CEA (ACCESS): CEA (CHCC): 4.44 ng/mL (ref 0.00–5.00)

## 2024-09-06 NOTE — Progress Notes (Signed)
 "  [facility]  ONCOLOGY CLINIC PROGRESS NOTE   Patient Care Team: Clarice Nottingham, MD as PCP - General (Internal Medicine) Ladona Heinz, MD as PCP - Cardiology (Cardiology) Golden Forestine BROCKS, RN as Oncology Nurse Navigator (Medical Oncology)  PATIENT NAME: Sara Frye   MR#: [id number] DOB: [birth year]  Date of visit: 09/06/2024   ASSESSMENT & PLAN:   Sara Frye is a 89 y.o. pleasant lady with past medical history of CVA, hypothyroidism, hypertension, atrial fibrillation, was initially referred to a rapid diagnostic clinic for evaluation of bilateral inguinal lymphadenopathy.  Felt to be reactive.  Also has small pancreatic lesion-likely IPMN based on MRI abdomen/MRCP.  No additional workup needed.  Pancreatic pseudocyst A small, asymptomatic pancreatic pseudocyst identified on recent MRI demonstrates no features concerning for malignancy. The lesion is stable and benign. - Reassured her that no further workup or intervention is indicated. - Recommended routine follow-up with primary care provider.  Renal cysts Imaging revealed small, benign-appearing cysts in the left kidney and elsewhere, without features suspicious for malignancy. No oncologic intervention is required. - Reassured her that no further follow-up or intervention is necessary.  Liver cirrhosis Imaging suggests possible cirrhosis, but findings are not definitive. She has no history of alcohol use, normal liver function tests, and no clinical or laboratory evidence of hepatic decompensation or symptoms attributable to liver disease. - Reassured her that liver function is stable and no further workup is needed at this time.  Lymphadenopathy Lymph nodes remain small with no change in size. Previous tests showed normal inflammatory markers and negative marrow involvement. Current blood counts are normal, suggesting reactive lymphadenopathy.  Flow cytometry of peripheral blood was unremarkable.   CRP, ESR were also within normal limits.  LDH normal.  Follow-up Since no additional workup is needed from hematology/oncology standpoint, she can be discharged from our office for continued follow-up with her PCP.  Please reconsult us  as necessary.  Patient and her family members were provided reassurance.   I reviewed lab results and outside records for this visit and discussed relevant results with the patient. Diagnosis, plan of care and treatment options were also discussed in detail with the patient. Opportunity provided to ask questions and answers provided to her apparent satisfaction. Provided instructions to call our clinic with any problems, questions or concerns prior to return visit. I recommended to continue follow-up with PCP and sub-specialists. She verbalized understanding and agreed with the plan.   NCCN guidelines have been consulted in the planning of this patients care.  I spent a total of 35 minutes during this encounter with the patient including review of chart and various tests results, discussions about plan of care and coordination of care plan.   Chinita Patten, MD  09/06/2024 3:19 PM  [facility] - A DEPT [facility] [address] [place] [address] Dept: [phone] Dept Fax: [phone]    CHIEF COMPLAINT/ REASON FOR VISIT:   Nonspecific inguinal lymph nodes, most likely reactive.  Nonspecific pancreatic tail lesion versus normal lobulation.  INTERVAL HISTORY:    Discussed the use of AI scribe software for clinical note transcription with the patient, who gave verbal consent to proceed.  History of Present Illness Sara Frye is a 89 year old female presenting for hematology/oncology follow-up regarding stable lymphadenopathy and recent abdominal imaging findings.  She underwent abdominal MRI last week to evaluate previously noted lymphadenopathy and to assess for possible  malignancy in the pancreas and surrounding structures. Imaging revealed a 1.2 cm pancreatic pseudocyst and several renal cysts, including one in the left kidney. She has not experienced abdominal pain, jaundice, or other symptoms of pancreatic or renal dysfunction, and no intervention has been required.  She has no prior diagnosis of liver disease, denies alcohol use, and has not experienced symptoms of hepatic dysfunction. Liver function tests remain within normal limits, and there is no evidence of hepatic decompensation.  She has not experienced fevers, night sweats, weight loss, or other B symptoms. No treatment has been required.  She reports feeling well overall, remains ambulatory and independent in daily activities, and has not developed any new or concerning symptoms related to her oncologic evaluation. She is experiencing some memory impairment. Her cholesterol medication was recently adjusted by her primary care provider.     I have reviewed the past medical history, past surgical history, social history and family history with the patient and they are unchanged from previous note.  HISTORY OF PRESENT ILLNESS:   PERTINENT HISTORY:  89 y.o. lady with medical history significant for stroke, hypothyroidism, hypertension, atrial fibrillation presents to the rapid diagnostic clinic for evaluation for bilateral inguinal lymphadenopathy. She is accompanied by her daughter for this visit.    On review of the previous records, Ms. Emry presented to the emergency room on 02/28/2023 for right lower extremity pain x 2 weeks. Venous ultrasound was obtained that showed no evidence of DVT but enlarged lymph nodes in the groin. Pelvic ultrasound was obtained on 04/11/2024 that showed enlarged lymph nodes in the bilateral inguinal regions.The largest in the right region measures 2.1 x 0.8 x 1.2 cm. The largest is in the left inguinal region measures 2.3 x 1.9 x 1.5 cm.  Flow cytometry of peripheral  blood was unremarkable.  CBCD and CMP were unremarkable.  CT abdomen and pelvis on 05/16/2024 showed query pancreatic tail lesion versus normal lobulation, incompletely assessed on current exam. Recommend dedicated MRI with contrast for further assessment if clinically warranted. Bilateral subcentimeter and pericentimeter inguinal lymph nodes, likely reactive.  On 08/01/2024, repeat CT abdomen and pelvis showed unchanged prominent bilateral inguinal lymph nodes measuring up to 1.1 cm on the left and right. Unchanged 1.2 cm mildly hyperattenuating lesion arising from the anterior aspect of the pancreatic body. Recommend further evaluation with contrast-enhanced MRI/MRCP.  Exophytic 1.6 cm hypodense lesion arising from the lateral interpolar left kidney does not measure simple fluid attenuation. This finding can also be evaluated on the above recommended MRI.  Inguinal lymphadenopathy was felt to be reactive.  For further evaluation of pancreatic lesion and renal cyst, MRI of the abdomen/MRCP was requested.  On 09/02/2024, MRI abdomen/MRCP showed fluid signal cyst arising from the ventral tail of the pancreas measuring 1.2 x 1.1 cm. No pancreatic ductal dilatation or surrounding inflammatory changes. This is most likely a small side branch IPMN or pseudocyst. Given small size, initially established imaging stability, and very advanced patient age, no further follow-up or characterization is required. Benign cyst arising from the peripheral midportion of the left kidney. Numerous additional tiny fluid signal cysts as well as a small hemorrhagic or proteinaceous cyst of the midportion of the left kidney. No solid renal lesions or suspicious contrast enhancement. No further follow-up or characterization is required for these benign Bosniak category I and II cysts.  Overall benign findings.  Considering her age of 56 years and benign findings, no additional workup was recommended and she was discharged from  oncology  office.  REVIEW OF SYSTEMS:   Review of Systems - Oncology  All other pertinent systems were reviewed with the patient and are negative.  ALLERGIES: She is allergic to shrimp (diagnostic) and valacyclovir.  MEDICATIONS:  Current Outpatient Medications  Medication Sig Dispense Refill   acetaminophen  (TYLENOL ) 500 MG tablet Take 500 mg by mouth every 4 (four) hours as needed.     Biotin 5000 MCG TABS Take 1 tablet by mouth daily.     cholecalciferol (VITAMIN D) 1000 units tablet Take 1,000 Units by mouth daily.     ELIQUIS  5 MG TABS tablet TAKE 1 TABLET BY MOUTH TWICE DAILY 180 tablet 1   ezetimibe (ZETIA) 10 MG tablet Take 10 mg by mouth daily.     furosemide (LASIX) 20 MG tablet TAKE 1 TABLET BY MOUTH DAILY AS NEEDED FOR LEG SWELLING (Patient taking differently: Take 20 mg by mouth daily.) 90 tablet 1   gabapentin  (NEURONTIN ) 100 MG capsule Take 100-200 mg by mouth See admin instructions. 100 mg in the morning, 200 mg at bedtime.     HYDROcodone -acetaminophen  (NORCO/VICODIN) 5-325 MG tablet Take 1 tablet by mouth every 4 (four) hours as needed. 10 tablet 0   levothyroxine  (SYNTHROID , LEVOTHROID) 25 MCG tablet Take 25 mcg by mouth daily.  0   lisinopril  (ZESTRIL ) 10 MG tablet NEW PRESCRIPTION REQUEST: Lisinopril  10 Mg TAKE ONE TABLET BY MOUTH DAILY 90 tablet 3   meclizine  (ANTIVERT ) 25 MG tablet Take 1 tablet (25 mg total) by mouth 3 (three) times daily as needed for dizziness. 30 tablet 0   OVER THE COUNTER MEDICATION Take 1 each by mouth daily. Beet root gummy     pantoprazole (PROTONIX) 40 MG tablet Take 40 mg by mouth daily.     potassium chloride  (KLOR-CON  M) 10 MEQ tablet Take 10 mEq by mouth every other day.     rosuvastatin  (CRESTOR ) 5 MG tablet Take 0.5 tablets (2.5 mg total) by mouth at bedtime. 90 tablet 1   sertraline  (ZOLOFT ) 50 MG tablet Take 50 mg by mouth daily.     tiZANidine  (ZANAFLEX ) 4 MG tablet Take 1 tablet (4 mg total) by mouth every 8 (eight) hours as  needed (back pain, muscle spasms). 30 tablet 0   traMADol  (ULTRAM ) 50 MG tablet Take 50 mg by mouth every 6 (six) hours as needed.     Vibegron (GEMTESA) 75 MG TABS Take 75 mg by mouth daily.     No current facility-administered medications for this visit.   Facility-Administered Medications Ordered in Other Visits  Medication Dose Route Frequency Provider Last Rate Last Admin   ceFAZolin  (ANCEF ) 2 g in dextrose  5 % 100 mL IVPB  2 g Intravenous Once Neita Kirsch A, PA-C         VITALS:   Blood pressure (!) 167/73, pulse 66, temperature 97.6 F (36.4 C), temperature source Temporal, resp. rate 19, weight 177 lb 11.2 oz (80.6 kg), SpO2 98%.  Wt Readings from Last 3 Encounters:  09/06/24 177 lb 11.2 oz (80.6 kg)  08/09/24 177 lb 9.6 oz (80.6 kg)  05/10/24 172 lb 3.2 oz (78.1 kg)    Body mass index is 34.7 kg/m.   Onc Performance Status - 09/06/24 1502       ECOG Perf Status   ECOG Perf Status Capable of only limited selfcare, confined to bed or chair more than 50% of waking hours      KPS SCALE   KPS % SCORE Requires considerable assistance, and frequent  medical care           PHYSICAL EXAM:   Physical Exam Constitutional:      General: She is not in acute distress.    Appearance: Normal appearance.  HENT:     Head: Normocephalic and atraumatic.  Cardiovascular:     Rate and Rhythm: Normal rate.  Pulmonary:     Effort: Pulmonary effort is normal. No respiratory distress.  Abdominal:     General: There is no distension.  Neurological:     General: No focal deficit present.     Mental Status: She is alert and oriented to person, place, and time.  Psychiatric:        Mood and Affect: Mood normal.        Behavior: Behavior normal.       LABORATORY DATA:   I have reviewed the data as listed.  Results for orders placed or performed in visit on 09/06/24  Lactate dehydrogenase  Result Value Ref Range   LDH 204 105 - 235 U/L  CMP (Cancer Center only)   Result Value Ref Range   Sodium 140 135 - 145 mmol/L   Potassium 4.0 3.5 - 5.1 mmol/L   Chloride 100 98 - 111 mmol/L   CO2 26 22 - 32 mmol/L   Glucose, Bld 88 70 - 99 mg/dL   BUN 13 8 - 23 mg/dL   Creatinine 9.15 9.55 - 1.00 mg/dL   Calcium  9.5 8.9 - 10.3 mg/dL   Total Protein 7.9 6.5 - 8.1 g/dL   Albumin 4.7 3.5 - 5.0 g/dL   AST 25 15 - 41 U/L   ALT 15 0 - 44 U/L   Alkaline Phosphatase 64 38 - 126 U/L   Total Bilirubin 0.3 0.0 - 1.2 mg/dL   GFR, Estimated >39 >39 mL/min   Anion gap 14 5 - 15  CBC with Differential (Cancer Center Only)  Result Value Ref Range   WBC Count 6.9 4.0 - 10.5 K/uL   RBC 4.52 3.87 - 5.11 MIL/uL   Hemoglobin 13.4 12.0 - 15.0 g/dL   HCT 60.0 63.9 - 53.9 %   MCV 88.3 80.0 - 100.0 fL   MCH 29.6 26.0 - 34.0 pg   MCHC 33.6 30.0 - 36.0 g/dL   RDW 86.3 88.4 - 84.4 %   Platelet Count 256 150 - 400 K/uL   nRBC 0.0 0.0 - 0.2 %   Neutrophils Relative % 54 %   Neutro Abs 3.7 1.7 - 7.7 K/uL   Lymphocytes Relative 32 %   Lymphs Abs 2.2 0.7 - 4.0 K/uL   Monocytes Relative 11 %   Monocytes Absolute 0.8 0.1 - 1.0 K/uL   Eosinophils Relative 3 %   Eosinophils Absolute 0.2 0.0 - 0.5 K/uL   Basophils Relative 0 %   Basophils Absolute 0.0 0.0 - 0.1 K/uL   Immature Granulocytes 0 %   Abs Immature Granulocytes 0.01 0.00 - 0.07 K/uL      RADIOGRAPHIC STUDIES:  I have personally reviewed the radiological images as listed and agree with the findings in the report.  MR 3D Recon At Scanner CLINICAL DATA:  Pancreatic cyst, indeterminate left renal lesion  EXAM: MRI ABDOMEN WITHOUT AND WITH CONTRAST (INCLUDING MRCP)  TECHNIQUE: Multiplanar multisequence MR imaging of the abdomen was performed both before and after the administration of intravenous contrast. Heavily T2-weighted images of the biliary and pancreatic ducts were obtained, and three-dimensional MRCP images were rendered by post processing.  CONTRAST:  6mL  GADAVIST  GADOBUTROL  1 MMOL/ML IV  SOLN  COMPARISON:  CT abdomen pelvis, 08/01/2024  FINDINGS: Lower chest: No acute abnormality.  Small hiatal hernia.  Hepatobiliary: No solid liver abnormality is seen. Lobular contour of the liver. Cholecystectomy. Mild postoperative biliary ductal dilatation.  Pancreas: Fluid signal cyst arising from the ventral tail of the pancreas measuring 1.2 x 1.1 cm (series 23, image 21). No pancreatic ductal dilatation or surrounding inflammatory changes.  Spleen: Normal in size without significant abnormality.  Adrenals/Urinary Tract: Adrenal glands are unremarkable. Benign cyst arising from the peripheral midportion of the left kidney. Numerous additional tiny fluid signal cysts as well as a small hemorrhagic or proteinaceous cyst of the midportion of the left kidney. No solid renal lesions or suspicious contrast enhancement. No obvious calculi. No hydronephrosis.  Stomach/Bowel: Stomach is within normal limits. No evidence of bowel wall thickening, distention, or inflammatory changes.  Vascular/Lymphatic: Severe aortic atherosclerosis. No enlarged abdominal lymph nodes.  Other: No abdominal wall hernia or abnormality. No ascites.  Musculoskeletal: No acute or significant osseous findings.  IMPRESSION: 1. Fluid signal cyst arising from the ventral tail of the pancreas measuring 1.2 x 1.1 cm. No pancreatic ductal dilatation or surrounding inflammatory changes. This is most likely a small side branch IPMN or pseudocyst. Given small size, initially established imaging stability, and very advanced patient age, no further follow-up or characterization is required. 2. Benign cyst arising from the peripheral midportion of the left kidney. Numerous additional tiny fluid signal cysts as well as a small hemorrhagic or proteinaceous cyst of the midportion of the left kidney. No solid renal lesions or suspicious contrast enhancement. No further follow-up or characterization is  required for these benign Bosniak category I and II cysts. 3. Lobular contour of the liver, suggestive of cirrhosis. 4. Status post cholecystectomy. 5. Small hiatal hernia. 6. Severe aortic atherosclerosis.  Electronically Signed   By: Marolyn JONETTA Jaksch M.D.   On: 09/03/2024 16:17 MR ABDOMEN MRCP W WO CONTAST CLINICAL DATA:  Pancreatic cyst, indeterminate left renal lesion  EXAM: MRI ABDOMEN WITHOUT AND WITH CONTRAST (INCLUDING MRCP)  TECHNIQUE: Multiplanar multisequence MR imaging of the abdomen was performed both before and after the administration of intravenous contrast. Heavily T2-weighted images of the biliary and pancreatic ducts were obtained, and three-dimensional MRCP images were rendered by post processing.  CONTRAST:  6mL GADAVIST  GADOBUTROL  1 MMOL/ML IV SOLN  COMPARISON:  CT abdomen pelvis, 08/01/2024  FINDINGS: Lower chest: No acute abnormality.  Small hiatal hernia.  Hepatobiliary: No solid liver abnormality is seen. Lobular contour of the liver. Cholecystectomy. Mild postoperative biliary ductal dilatation.  Pancreas: Fluid signal cyst arising from the ventral tail of the pancreas measuring 1.2 x 1.1 cm (series 23, image 21). No pancreatic ductal dilatation or surrounding inflammatory changes.  Spleen: Normal in size without significant abnormality.  Adrenals/Urinary Tract: Adrenal glands are unremarkable. Benign cyst arising from the peripheral midportion of the left kidney. Numerous additional tiny fluid signal cysts as well as a small hemorrhagic or proteinaceous cyst of the midportion of the left kidney. No solid renal lesions or suspicious contrast enhancement. No obvious calculi. No hydronephrosis.  Stomach/Bowel: Stomach is within normal limits. No evidence of bowel wall thickening, distention, or inflammatory changes.  Vascular/Lymphatic: Severe aortic atherosclerosis. No enlarged abdominal lymph nodes.  Other: No abdominal wall hernia or  abnormality. No ascites.  Musculoskeletal: No acute or significant osseous findings.  IMPRESSION: 1. Fluid signal cyst arising from the ventral tail of the pancreas measuring 1.2 x 1.1 cm.  No pancreatic ductal dilatation or surrounding inflammatory changes. This is most likely a small side branch IPMN or pseudocyst. Given small size, initially established imaging stability, and very advanced patient age, no further follow-up or characterization is required. 2. Benign cyst arising from the peripheral midportion of the left kidney. Numerous additional tiny fluid signal cysts as well as a small hemorrhagic or proteinaceous cyst of the midportion of the left kidney. No solid renal lesions or suspicious contrast enhancement. No further follow-up or characterization is required for these benign Bosniak category I and II cysts. 3. Lobular contour of the liver, suggestive of cirrhosis. 4. Status post cholecystectomy. 5. Small hiatal hernia. 6. Severe aortic atherosclerosis.  Electronically Signed   By: Marolyn JONETTA Jaksch M.D.   On: 09/03/2024 16:17   CODE STATUS:  Code Status History     Date Active Date Inactive Code Status Order ID Comments User Context   07/21/2022 0932 07/23/2022 1839 Full Code [id number]  Verdene Purchase, MD Inpatient   01/07/2017 1459 01/08/2017 1735 Full Code [id number]  Dolphus Carrion, MD Inpatient   12/17/2015 0046 12/18/2015 1847 Full Code [id number]  Charlton Evalene RAMAN, MD ED       No orders of the defined types were placed in this encounter.    Future Appointments  Date Time Provider Department Center  10/10/2024 10:00 AM Ladona Heinz, MD [facility]     This document was completed utilizing speech recognition software. Grammatical errors, random word insertions, pronoun errors, and incomplete sentences are an occasional consequence of this system due to software limitations, ambient noise, and hardware issues. Any formal questions or concerns about the  content, text or information contained within the body of this dictation should be directly addressed to the provider for clarification.   "

## 2024-09-07 ENCOUNTER — Other Ambulatory Visit: Payer: Self-pay | Admitting: Internal Medicine

## 2024-09-07 DIAGNOSIS — R413 Other amnesia: Secondary | ICD-10-CM

## 2024-09-07 LAB — CANCER ANTIGEN 19-9: CA 19-9: 30 U/mL (ref 0–35)

## 2024-09-11 ENCOUNTER — Encounter: Payer: Self-pay | Admitting: Oncology

## 2024-09-11 ENCOUNTER — Encounter: Payer: Self-pay | Admitting: Physician Assistant

## 2024-09-11 DIAGNOSIS — R591 Generalized enlarged lymph nodes: Secondary | ICD-10-CM | POA: Insufficient documentation

## 2024-09-11 DIAGNOSIS — N281 Cyst of kidney, acquired: Secondary | ICD-10-CM | POA: Insufficient documentation

## 2024-09-12 ENCOUNTER — Encounter: Payer: Self-pay | Admitting: Medical Oncology

## 2024-09-12 NOTE — Progress Notes (Signed)
 Rapid Diagnostic Service for Malignancy  Hand-off Note  09/12/24 10:57 AM  Sara Frye May 18, 1929 [id number]  Cancer Care, Care Team: Chinita Patten, MD [employer] Police, PA-C Colene Raider, Diagnostic Nurse Navigator  Sara Frye was referred to [facility] on May 04, 2024 for evaluation of: bilateral inguinal adenopathy.  The patient's diagnostic work-up included: Labs: C-reactive protein, Sedimentation rate, Flow Cytometry, Lactate dehydrogenase, CMP, CBC with differential, CEA, CA 19-9 Imaging: CT Abdomen and Pelvis (05/16/2024), CT Abdomen and Pelvis (08/01/2024), MRI Abdomen MRCP (09/02/2024)   The patient was found to not have malignancy at this time, as evaluated for the reason for referral stated above.  The recommended follow-up provided to the patient includes: continued follow-up with PCP.  We thank you for allowing us  to assist in Sara Frye's care.  The initial and most recent Progress Notes, labs, imaging, procedure(s), and/or consult notes have been routed to you through [facility] or faxed to your office for continuity of care.   [name], RN, BSN Oncology Nurse Navigator, Rapid Diagnostic Services 09/12/2024 11:06 AM

## 2024-09-22 ENCOUNTER — Encounter: Payer: Self-pay | Admitting: Internal Medicine

## 2024-09-25 ENCOUNTER — Other Ambulatory Visit

## 2024-10-10 ENCOUNTER — Ambulatory Visit: Admitting: Cardiology

## 2024-10-11 ENCOUNTER — Other Ambulatory Visit
